# Patient Record
Sex: Male | Born: 1950 | Race: Black or African American | Hispanic: No | Marital: Single | Smoking: Never smoker
Health system: Southern US, Community
[De-identification: ages and names within clinical notes are randomized; demographics above are authoritative.]

## PROBLEM LIST (undated history)

## (undated) DIAGNOSIS — D494 Neoplasm of unspecified behavior of bladder: Secondary | ICD-10-CM

## (undated) DIAGNOSIS — K219 Gastro-esophageal reflux disease without esophagitis: Secondary | ICD-10-CM

## (undated) DIAGNOSIS — I452 Bifascicular block: Secondary | ICD-10-CM

## (undated) DIAGNOSIS — E785 Hyperlipidemia, unspecified: Secondary | ICD-10-CM

## (undated) DIAGNOSIS — I10 Essential (primary) hypertension: Secondary | ICD-10-CM

## (undated) DIAGNOSIS — Z7901 Long term (current) use of anticoagulants: Secondary | ICD-10-CM

## (undated) DIAGNOSIS — N400 Enlarged prostate without lower urinary tract symptoms: Secondary | ICD-10-CM

## (undated) DIAGNOSIS — I2510 Atherosclerotic heart disease of native coronary artery without angina pectoris: Secondary | ICD-10-CM

## (undated) DIAGNOSIS — Z978 Presence of other specified devices: Secondary | ICD-10-CM

## (undated) DIAGNOSIS — N401 Enlarged prostate with lower urinary tract symptoms: Secondary | ICD-10-CM

## (undated) DIAGNOSIS — R52 Pain, unspecified: Secondary | ICD-10-CM

## (undated) DIAGNOSIS — G4762 Sleep related leg cramps: Secondary | ICD-10-CM

## (undated) DIAGNOSIS — J302 Other seasonal allergic rhinitis: Secondary | ICD-10-CM

## (undated) HISTORY — PX: WISDOM TOOTH EXTRACTION: SHX21

## (undated) HISTORY — PX: CARDIAC CATHETERIZATION: SHX172

---

## 1997-08-13 ENCOUNTER — Encounter: Admission: RE | Admit: 1997-08-13 | Discharge: 1997-08-13 | Payer: Self-pay | Admitting: *Deleted

## 2009-10-31 ENCOUNTER — Emergency Department (HOSPITAL_COMMUNITY): Admission: EM | Admit: 2009-10-31 | Discharge: 2009-10-31 | Payer: Self-pay | Admitting: Emergency Medicine

## 2009-11-03 ENCOUNTER — Other Ambulatory Visit (HOSPITAL_COMMUNITY): Admission: RE | Admit: 2009-11-03 | Discharge: 2009-11-25 | Payer: Self-pay | Admitting: Psychiatry

## 2009-11-08 ENCOUNTER — Ambulatory Visit (HOSPITAL_COMMUNITY): Payer: Self-pay | Admitting: Psychiatry

## 2009-11-18 ENCOUNTER — Ambulatory Visit: Payer: Self-pay | Admitting: Psychiatry

## 2010-05-17 ENCOUNTER — Other Ambulatory Visit: Payer: Self-pay | Admitting: Otolaryngology

## 2010-05-17 DIAGNOSIS — R430 Anosmia: Secondary | ICD-10-CM

## 2010-05-20 ENCOUNTER — Ambulatory Visit
Admission: RE | Admit: 2010-05-20 | Discharge: 2010-05-20 | Disposition: A | Discharge status: Home / Self Care | Payer: BC Managed Care – PPO | Source: Ambulatory Visit | Attending: Otolaryngology | Admitting: Otolaryngology

## 2010-05-20 DIAGNOSIS — R430 Anosmia: Secondary | ICD-10-CM

## 2010-05-20 MED ORDER — GADOBENATE DIMEGLUMINE 529 MG/ML IV SOLN
15.0000 mL | Freq: Once | INTRAVENOUS | Status: AC | PRN
  Administered 2010-05-20: 15 mL via INTRAVENOUS

## 2010-12-18 ENCOUNTER — Other Ambulatory Visit: Payer: Self-pay | Admitting: Family Medicine

## 2012-08-07 ENCOUNTER — Other Ambulatory Visit: Payer: Self-pay | Admitting: Urology

## 2012-10-01 ENCOUNTER — Encounter (HOSPITAL_COMMUNITY): Payer: Self-pay | Admitting: Pharmacy Technician

## 2012-10-06 ENCOUNTER — Encounter (HOSPITAL_COMMUNITY)
Admission: RE | Admit: 2012-10-06 | Discharge: 2012-10-06 | Disposition: A | Discharge status: Home / Self Care | Payer: BC Managed Care – PPO | Source: Ambulatory Visit | Attending: Urology | Admitting: Urology

## 2012-10-06 ENCOUNTER — Ambulatory Visit (HOSPITAL_COMMUNITY)
Admission: RE | Admit: 2012-10-06 | Discharge: 2012-10-06 | Disposition: A | Discharge status: Home / Self Care | Payer: BC Managed Care – PPO | Source: Ambulatory Visit | Attending: Urology | Admitting: Urology

## 2012-10-06 ENCOUNTER — Encounter (HOSPITAL_COMMUNITY): Payer: Self-pay

## 2012-10-06 DIAGNOSIS — Z01812 Encounter for preprocedural laboratory examination: Secondary | ICD-10-CM | POA: Insufficient documentation

## 2012-10-06 DIAGNOSIS — Z01818 Encounter for other preprocedural examination: Secondary | ICD-10-CM | POA: Insufficient documentation

## 2012-10-06 DIAGNOSIS — I10 Essential (primary) hypertension: Secondary | ICD-10-CM | POA: Insufficient documentation

## 2012-10-06 HISTORY — DX: Sleep related leg cramps: G47.62

## 2012-10-06 HISTORY — DX: Other seasonal allergic rhinitis: J30.2

## 2012-10-06 HISTORY — DX: Benign prostatic hyperplasia without lower urinary tract symptoms: N40.0

## 2012-10-06 HISTORY — DX: Essential (primary) hypertension: I10

## 2012-10-06 HISTORY — DX: Pain, unspecified: R52

## 2012-10-06 HISTORY — DX: Gastro-esophageal reflux disease without esophagitis: K21.9

## 2012-10-06 HISTORY — DX: Neoplasm of unspecified behavior of bladder: D49.4

## 2012-10-06 LAB — CBC
HCT: 44.2 % (ref 39.0–52.0)
Hemoglobin: 14.5 g/dL (ref 13.0–17.0)
MCH: 28 pg (ref 26.0–34.0)
MCHC: 32.8 g/dL (ref 30.0–36.0)
RBC: 5.17 MIL/uL (ref 4.22–5.81)

## 2012-10-06 LAB — BASIC METABOLIC PANEL
BUN: 12 mg/dL (ref 6–23)
CO2: 26 mEq/L (ref 19–32)
GFR calc non Af Amer: 87 mL/min — ABNORMAL LOW (ref >90)
Glucose, Bld: 119 mg/dL — ABNORMAL HIGH (ref 70–99)
Potassium: 3.9 mEq/L (ref 3.5–5.1)
Sodium: 134 mEq/L — ABNORMAL LOW (ref 135–145)

## 2012-10-06 NOTE — Patient Instructions (Signed)
YOUR SURGERY IS SCHEDULED AT [facility]  ON:  Friday 8/1  REPORT TO [place] [facility]ORT STAY [facility] AT:  7:00 AM      PHONE # FOR SHORT STAY IS [phone]  FOLLOW YOUR CLEAR LIQUID DIET AND LAXATIVE INSTRUCTIONS DAY BEFORE SURGERY - GIVEN BY DR. TANNENBAUM'S OFFICE.  DO NOT EAT OR DRINK ANYTHING AFTER MIDNIGHT THE NIGHT BEFORE YOUR SURGERY.  YOU MAY BRUSH YOUR TEETH, RINSE OUT YOUR MOUTH--BUT NO WATER, NO FOOD, NO CHEWING GUM, NO MINTS, NO CANDIES, NO CHEWING TOBACCO.  PLEASE TAKE THE FOLLOWING MEDICATIONS THE AM OF YOUR SURGERY WITH A FEW SIPS OF WATER:  AMLODIPINE    DO NOT BRING VALUABLES, MONEY, CREDIT CARDS.  DO NOT WEAR JEWELRY, MAKE-UP, NAIL POLISH AND NO METAL PINS OR CLIPS IN YOUR HAIR. CONTACT LENS, DENTURES / PARTIALS, GLASSES SHOULD NOT BE WORN TO SURGERY AND IN MOST CASES-HEARING AIDS WILL NEED TO BE REMOVED.  BRING YOUR GLASSES CASE, ANY EQUIPMENT NEEDED FOR YOUR CONTACT LENS. FOR PATIENTS ADMITTED TO THE HOSPITAL--CHECK OUT TIME THE DAY OF DISCHARGE IS 11:00 AM.  ALL INPATIENT ROOMS ARE PRIVATE - WITH BATHROOM, TELEPHONE, TELEVISION AND WIFI INTERNET.                             PLEASE READ OVER ANY  FACT SHEETS THAT YOU WERE GIVEN:  BLOOD TRANSFUSION INFORMATION, INCENTIVE SPIROMETER INFORMATION. FAILURE TO FOLLOW THESE INSTRUCTIONS MAY RESULT IN THE CANCELLATION OF YOUR SURGERY.   PATIENT SIGNATURE_________________________________

## 2012-10-06 NOTE — Pre-Procedure Instructions (Signed)
EKG REPORT AND OFFICE NOTES FROM 08/05/12 ON PT'S CHART FROM DR. RAMACHANDRAN. CXR WAS DONE TODAY AT [facility] - PREOP.

## 2012-10-17 ENCOUNTER — Inpatient Hospital Stay (HOSPITAL_COMMUNITY): Payer: BC Managed Care – PPO

## 2012-10-17 ENCOUNTER — Encounter (HOSPITAL_COMMUNITY): Payer: Self-pay | Admitting: Certified Registered Nurse Anesthetist

## 2012-10-17 ENCOUNTER — Encounter (HOSPITAL_COMMUNITY): Payer: Self-pay | Admitting: *Deleted

## 2012-10-17 ENCOUNTER — Ambulatory Visit (HOSPITAL_COMMUNITY): Payer: BC Managed Care – PPO | Admitting: Certified Registered Nurse Anesthetist

## 2012-10-17 ENCOUNTER — Inpatient Hospital Stay (HOSPITAL_COMMUNITY)
Admission: RE | Admit: 2012-10-17 | Discharge: 2012-10-20 | DRG: 335 | Disposition: A | Discharge status: Home / Self Care | Payer: BC Managed Care – PPO | Source: Ambulatory Visit | Attending: Urology | Admitting: Urology

## 2012-10-17 ENCOUNTER — Encounter (HOSPITAL_COMMUNITY)
Admission: RE | Disposition: A | Discharge status: Home / Self Care | Payer: Self-pay | Source: Ambulatory Visit | Attending: Urology

## 2012-10-17 DIAGNOSIS — N400 Enlarged prostate without lower urinary tract symptoms: Secondary | ICD-10-CM

## 2012-10-17 DIAGNOSIS — N401 Enlarged prostate with lower urinary tract symptoms: Principal | ICD-10-CM | POA: Diagnosis present

## 2012-10-17 DIAGNOSIS — F329 Major depressive disorder, single episode, unspecified: Secondary | ICD-10-CM | POA: Diagnosis present

## 2012-10-17 DIAGNOSIS — F3289 Other specified depressive episodes: Secondary | ICD-10-CM | POA: Diagnosis present

## 2012-10-17 DIAGNOSIS — I10 Essential (primary) hypertension: Secondary | ICD-10-CM | POA: Diagnosis present

## 2012-10-17 DIAGNOSIS — R972 Elevated prostate specific antigen [PSA]: Secondary | ICD-10-CM | POA: Diagnosis present

## 2012-10-17 DIAGNOSIS — Z79899 Other long term (current) drug therapy: Secondary | ICD-10-CM

## 2012-10-17 DIAGNOSIS — F411 Generalized anxiety disorder: Secondary | ICD-10-CM | POA: Diagnosis present

## 2012-10-17 HISTORY — PX: PROSTATECTOMY: SHX69

## 2012-10-17 LAB — HEMOGLOBIN AND HEMATOCRIT, BLOOD
HCT: 31.7 % — ABNORMAL LOW (ref 39.0–52.0)
Hemoglobin: 10.4 g/dL — ABNORMAL LOW (ref 13.0–17.0)

## 2012-10-17 LAB — ABO/RH: ABO/RH(D): AB POS

## 2012-10-17 SURGERY — PROSTATECTOMY, SUPRAPUBIC APPROACH
Anesthesia: General | Wound class: Clean

## 2012-10-17 MED ORDER — MIDAZOLAM HCL 5 MG/5ML IJ SOLN
INTRAMUSCULAR | Status: DC | PRN
  Administered 2012-10-17: 2 mg via INTRAVENOUS

## 2012-10-17 MED ORDER — ROCURONIUM BROMIDE 100 MG/10ML IV SOLN
INTRAVENOUS | Status: DC | PRN
  Administered 2012-10-17: 50 mg via INTRAVENOUS
  Administered 2012-10-17 (×3): 10 mg via INTRAVENOUS

## 2012-10-17 MED ORDER — SODIUM CHLORIDE 0.9 % IR SOLN
3000.0000 mL | Status: DC
  Administered 2012-10-17 – 2012-10-18 (×4): 3000 mL

## 2012-10-17 MED ORDER — ACETAMINOPHEN 325 MG PO TABS: 650.0000 mg | ORAL_TABLET | ORAL | Status: DC | PRN

## 2012-10-17 MED ORDER — OXYCODONE HCL 5 MG/5ML PO SOLN
5.0000 mg | Freq: Once | ORAL | Status: DC | PRN
  Filled 2012-10-17: qty 5

## 2012-10-17 MED ORDER — HYDROCODONE-ACETAMINOPHEN 5-325 MG PO TABS
1.0000 | ORAL_TABLET | ORAL | Status: DC | PRN
  Administered 2012-10-18: 2 via ORAL
  Administered 2012-10-18: 1 via ORAL
  Administered 2012-10-19 – 2012-10-20 (×4): 2 via ORAL
  Filled 2012-10-17: qty 1
  Filled 2012-10-17 (×5): qty 2

## 2012-10-17 MED ORDER — GLYCOPYRROLATE 0.2 MG/ML IJ SOLN
INTRAMUSCULAR | Status: DC | PRN
  Administered 2012-10-17: 0.6 mg via INTRAVENOUS

## 2012-10-17 MED ORDER — DEXTROSE-NACL 5-0.45 % IV SOLN
INTRAVENOUS | Status: DC
  Administered 2012-10-17: 1000 mL via INTRAVENOUS
  Administered 2012-10-17 – 2012-10-20 (×4): via INTRAVENOUS

## 2012-10-17 MED ORDER — TRIMETHOPRIM 100 MG PO TABS
200.0000 mg | ORAL_TABLET | Freq: Every day | ORAL | Status: DC
  Administered 2012-10-18 – 2012-10-20 (×3): 200 mg via ORAL
  Filled 2012-10-17 (×3): qty 2

## 2012-10-17 MED ORDER — LIDOCAINE HCL (CARDIAC) 20 MG/ML IV SOLN
INTRAVENOUS | Status: DC | PRN
  Administered 2012-10-17: 50 mg via INTRAVENOUS

## 2012-10-17 MED ORDER — CIPROFLOXACIN IN D5W 400 MG/200ML IV SOLN
400.0000 mg | INTRAVENOUS | Status: AC
  Administered 2012-10-17: 400 mg via INTRAVENOUS

## 2012-10-17 MED ORDER — KETOROLAC TROMETHAMINE 30 MG/ML IJ SOLN
30.0000 mg | Freq: Four times a day (QID) | INTRAMUSCULAR | Status: AC
  Administered 2012-10-17 – 2012-10-18 (×6): 30 mg via INTRAVENOUS
  Filled 2012-10-17 (×7): qty 1

## 2012-10-17 MED ORDER — LOSARTAN POTASSIUM-HCTZ 50-12.5 MG PO TABS: 1.0000 | ORAL_TABLET | Freq: Every morning | ORAL | Status: DC

## 2012-10-17 MED ORDER — PROMETHAZINE HCL 25 MG/ML IJ SOLN: 6.2500 mg | INTRAMUSCULAR | Status: DC | PRN

## 2012-10-17 MED ORDER — HYDROMORPHONE HCL PF 1 MG/ML IJ SOLN
0.2500 mg | INTRAMUSCULAR | Status: DC | PRN
  Administered 2012-10-17 (×2): 0.5 mg via INTRAVENOUS

## 2012-10-17 MED ORDER — HYDROCHLOROTHIAZIDE 12.5 MG PO CAPS
12.5000 mg | ORAL_CAPSULE | Freq: Every day | ORAL | Status: DC
  Administered 2012-10-18 – 2012-10-20 (×3): 12.5 mg via ORAL
  Filled 2012-10-17 (×3): qty 1

## 2012-10-17 MED ORDER — PROMETHAZINE HCL 25 MG PO TABS
25.0000 mg | ORAL_TABLET | Freq: Three times a day (TID) | ORAL | Status: DC | PRN
  Administered 2012-10-17: 25 mg via ORAL
  Filled 2012-10-17: qty 1

## 2012-10-17 MED ORDER — AMLODIPINE BESYLATE 5 MG PO TABS
5.0000 mg | ORAL_TABLET | Freq: Every morning | ORAL | Status: DC
  Administered 2012-10-18 – 2012-10-20 (×3): 5 mg via ORAL
  Filled 2012-10-17 (×3): qty 1

## 2012-10-17 MED ORDER — PROPOFOL 10 MG/ML IV BOLUS
INTRAVENOUS | Status: DC | PRN
  Administered 2012-10-17: 200 mg via INTRAVENOUS

## 2012-10-17 MED ORDER — ONDANSETRON HCL 4 MG/2ML IJ SOLN
INTRAMUSCULAR | Status: DC | PRN
  Administered 2012-10-17: 4 mg via INTRAVENOUS

## 2012-10-17 MED ORDER — MEPERIDINE HCL 50 MG/ML IJ SOLN
6.2500 mg | INTRAMUSCULAR | Status: DC | PRN
Start: 2012-10-17 — End: 2012-10-17

## 2012-10-17 MED ORDER — OXYCODONE HCL 5 MG PO TABS: 5.0000 mg | ORAL_TABLET | Freq: Once | ORAL | Status: DC | PRN

## 2012-10-17 MED ORDER — INDIGOTINDISULFONATE SODIUM 8 MG/ML IJ SOLN
INTRAMUSCULAR | Status: DC | PRN
  Administered 2012-10-17: 5 mL via INTRAVENOUS

## 2012-10-17 MED ORDER — MORPHINE SULFATE 2 MG/ML IJ SOLN
2.0000 mg | INTRAMUSCULAR | Status: DC | PRN
  Administered 2012-10-17: 2 mg via INTRAVENOUS
  Filled 2012-10-17: qty 1

## 2012-10-17 MED ORDER — NEOSTIGMINE METHYLSULFATE 1 MG/ML IJ SOLN
INTRAMUSCULAR | Status: DC | PRN
  Administered 2012-10-17: 5 mg via INTRAVENOUS

## 2012-10-17 MED ORDER — PHENYLEPHRINE HCL 10 MG/ML IJ SOLN
INTRAMUSCULAR | Status: DC | PRN
  Administered 2012-10-17: 80 ug via INTRAVENOUS
  Administered 2012-10-17: 40 ug via INTRAVENOUS

## 2012-10-17 MED ORDER — HYDROMORPHONE HCL PF 1 MG/ML IJ SOLN
INTRAMUSCULAR | Status: DC | PRN
  Administered 2012-10-17: 1 mg via INTRAVENOUS
  Administered 2012-10-17: 0.5 mg via INTRAVENOUS

## 2012-10-17 MED ORDER — LACTATED RINGERS IV SOLN
INTRAVENOUS | Status: DC
  Administered 2012-10-17: 11:00:00 via INTRAVENOUS
  Administered 2012-10-17 (×2): 1000 mL via INTRAVENOUS
  Administered 2012-10-17: 10:00:00 via INTRAVENOUS

## 2012-10-17 MED ORDER — SODIUM CHLORIDE 0.9 % IJ SOLN
INTRAMUSCULAR | Status: DC | PRN
  Administered 2012-10-17: 12:00:00

## 2012-10-17 MED ORDER — LOSARTAN POTASSIUM 50 MG PO TABS
50.0000 mg | ORAL_TABLET | Freq: Every day | ORAL | Status: DC
  Administered 2012-10-18 – 2012-10-20 (×3): 50 mg via ORAL
  Filled 2012-10-17 (×3): qty 1

## 2012-10-17 MED ORDER — FENTANYL CITRATE 0.05 MG/ML IJ SOLN
INTRAMUSCULAR | Status: DC | PRN
  Administered 2012-10-17 (×2): 100 ug via INTRAVENOUS

## 2012-10-17 MED ORDER — PHENYLEPHRINE HCL 10 MG/ML IJ SOLN
10.0000 mg | INTRAVENOUS | Status: DC | PRN
  Administered 2012-10-17: 50 ug/min via INTRAVENOUS

## 2012-10-17 MED ORDER — BUPIVACAINE LIPOSOME 1.3 % IJ SUSP
20.0000 mL | Freq: Once | INTRAMUSCULAR | Status: DC
  Filled 2012-10-17: qty 20

## 2012-10-17 SURGICAL SUPPLY — 53 items
BAG URINE DRAINAGE (UROLOGICAL SUPPLIES) ×2 IMPLANT
BLADE EXTENDED COATED 6.5IN (ELECTRODE) ×2 IMPLANT
BLADE HEX COATED 2.75 (ELECTRODE) ×2 IMPLANT
BLADE SURG SZ12 CARB STEEL (BLADE) ×2 IMPLANT
CATH FOLEY 2WAY SLVR 30CC 22FR (CATHETERS) IMPLANT
CATH FOLEY 2WAY SLVR 30CC 24FR (CATHETERS) ×1 IMPLANT
CATH FOLEY 3WAY 30CC 24FR (CATHETERS)
CATH HEMA 3WAY 30CC 24FR COUDE (CATHETERS) ×1 IMPLANT
CATH URET 5FR 28IN OPEN ENDED (CATHETERS) ×1 IMPLANT
CATH URTH STD 24FR FL 3W 2 (CATHETERS) ×1 IMPLANT
CLOTH BEACON ORANGE TIMEOUT ST (SAFETY) ×2 IMPLANT
CONT SPEC 4OZ CLIKSEAL STRL BL (MISCELLANEOUS) ×1 IMPLANT
COVER SURGICAL LIGHT HANDLE (MISCELLANEOUS) ×2 IMPLANT
DISSECTOR ROUND CHERRY 3/8 STR (MISCELLANEOUS) ×1 IMPLANT
DRAIN CHANNEL 10F 3/8 F FF (DRAIN) IMPLANT
DRAIN CHANNEL 19F RND (DRAIN) ×1 IMPLANT
DRAPE LAPAROTOMY T 102X78X121 (DRAPES) ×2 IMPLANT
DRAPE UTILITY 15X26 (DRAPE) ×2 IMPLANT
DRAPE WARM FLUID 44X44 (DRAPE) ×2 IMPLANT
ELECT REM PT RETURN 9FT ADLT (ELECTROSURGICAL) ×2
ELECTRODE REM PT RTRN 9FT ADLT (ELECTROSURGICAL) ×1 IMPLANT
EVACUATOR SILICONE 100CC (DRAIN) ×1 IMPLANT
GAUZE SPONGE 4X4 16PLY XRAY LF (GAUZE/BANDAGES/DRESSINGS) ×2 IMPLANT
GLOVE BIOGEL M STRL SZ7.5 (GLOVE) ×7 IMPLANT
GOWN STRL REIN XL XLG (GOWN DISPOSABLE) ×4 IMPLANT
GUIDEWIRE STR DUAL SENSOR (WIRE) ×1 IMPLANT
KIT BASIN OR (CUSTOM PROCEDURE TRAY) ×2 IMPLANT
LUBRICANT JELLY ST 5GR 8946 (MISCELLANEOUS) ×4 IMPLANT
NEEDLE HYPO 22GX1.5 SAFETY (NEEDLE) ×2 IMPLANT
NS IRRIG 1000ML POUR BTL (IV SOLUTION) ×4 IMPLANT
PACK GENERAL/GYN (CUSTOM PROCEDURE TRAY) ×2 IMPLANT
PLUG CATH AND CAP STER (CATHETERS) ×3 IMPLANT
SCRUB PCMX 4 OZ (MISCELLANEOUS) ×2 IMPLANT
SET IRRIG Y TYPE TUR BLADDER L (SET/KITS/TRAYS/PACK) IMPLANT
SPONGE LAP 18X18 X RAY DECT (DISPOSABLE) ×2 IMPLANT
SPONGE LAP 4X18 X RAY DECT (DISPOSABLE) ×2 IMPLANT
STAPLER SKIN PROX WIDE 3.9 (STAPLE) IMPLANT
STAPLER VISISTAT 35W (STAPLE) IMPLANT
SUT ETHILON 3 0 PS 1 (SUTURE) ×3 IMPLANT
SUT PDS AB 0 CTX 36 PDP370T (SUTURE) ×3 IMPLANT
SUT PDS AB 1 CTX 36 (SUTURE) ×2 IMPLANT
SUT SILK 0 (SUTURE) ×2
SUT SILK 0 30XBRD TIE 6 (SUTURE) ×1 IMPLANT
SUT SILK 2 0 (SUTURE) ×2
SUT SILK 2-0 30XBRD TIE 12 (SUTURE) IMPLANT
SUT VIC AB 0 UR5 27 (SUTURE) ×4 IMPLANT
SUT VIC AB 2-0 UR5 27 (SUTURE) ×2 IMPLANT
SUT VIC AB 2-0 UR6 27 (SUTURE) ×15 IMPLANT
SYR 30ML LL (SYRINGE) ×2 IMPLANT
SYR CONTROL 10ML LL (SYRINGE) ×1 IMPLANT
TOWEL OR 17X26 10 PK STRL BLUE (TOWEL DISPOSABLE) ×4 IMPLANT
TOWEL OR NON WOVEN STRL DISP B (DISPOSABLE) ×2 IMPLANT
WATER STERILE IRR 1500ML POUR (IV SOLUTION) ×2 IMPLANT

## 2012-10-17 NOTE — Progress Notes (Signed)
Pt has committed twice since surgery so we did not get him up to walk.

## 2012-10-17 NOTE — H&P (Signed)
hief Complaint   cc: Dr. Barrie Dunker   Reason For Visit  Yearly f/u, PUS, flowrate & PVR   Active Problems Problems  1. 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  He was originally referred by Dr. Nicholos Johns for evaluation of elevated psa and BPH symptoms. He has had a prostate biopsy in Oct. 2011 with a PSA of 11.25.  Pathology showed BPH & chronic inflammation.  Negative family history for CaP.   He is having cramping in feet.  No dizziness. No fainting.      He has minimal symptoms of bladder outlet symptoms.  Previous PUS showing size = 226.38cc-now 215.65cc.   07/30/12:  PSA  8.63/25% 09/28/11  PSA - 13.16 07/19/10  PSA - 14.70 01/12/10  PSA - 12.06   Past Medical History Problems  1. History of  Anxiety (Symptom) 300.00 2. History of  Depression 311 3. History of  Hypertension 401.9 4. History of  Vertigo 780.4  Surgical History Problems  1. History of  No Surgical Problems  Current Meds 1. AmLODIPine Besylate 5 MG Oral Tablet; Therapy: 26Feb2013 to 2. Finasteride 5 MG Oral Tablet; TAKE 1 TABLET DAILY AS DIRECTED; Therapy: 17Jul2013 to  (Evaluate:12Jul2014)  Requested for: 17Jul2013; Last Rx:17Jul2013 3. Losartan Potassium-HCTZ 50-12.5 MG Oral Tablet; Therapy: 26Feb2013 to 4. Tamsulosin HCl 0.4 MG Oral Capsule; TAKE 2 CAPSULES BY MOUTH DAILY; Therapy:  04Oct2011 to (Evaluate:12Jul2014)  Requested for: 17Jul2013; Last Rx:17Jul2013 5. Triamcinolone Acetonide 0.025 % External Cream; Therapy: 08May2014 to  Allergies Medication  1. Penicillins  Family History Problems  1. Maternal history of  Breast Lump 2. Sororal history of  Breast Lump 3. Sororal history of  Breast Lump 4. Family history of  Death In The Family Father 67; stomach cancer 5. Family history  of  Family Health Status - Mother's Age 89 6. Maternal history of  Heart Disease V17.49 7. Family history of  Laryngeal Cancer V16.2  Social History Problems  1. Marital History - Single 2. Never A Smoker 3. 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 History of  Alcohol Use 5. History of  Caffeine Use 6. History of  Tobacco Use  Review of Systems Genitourinary, constitutional, skin, eye, otolaryngeal, hematologic/lymphatic, cardiovascular, pulmonary, endocrine, musculoskeletal, gastrointestinal, neurological and psychiatric system(s) were reviewed and pertinent findings if present are noted.  Genitourinary: urinary frequency, feelings of urinary urgency, nocturia, weak urinary stream, urinary stream starts and stops, incomplete emptying of bladder and initiating urination requires straining.    Vitals Vital Signs [Data Includes: Last 1 Day]  20May2014 11:24AM  BMI Calculated: 26.66 BSA Calculated: 1.97 Height: 5 ft 9 in Weight: 180 lb  Blood Pressure: 146 / 74 Temperature: 97.8 F Heart Rate: 78  Physical Exam Rectal: Rectal exam demonstrates normal sphincter tone, no tenderness and no masses. Estimated prostate size is 4+. The prostate has no nodularity and is not tender. The left seminal vesicle is nonpalpable. The right seminal vesicle is nonpalpable. The perineum is normal on inspection.    Results/Data  Flow Rate: Voided 25 ml. A peak flow rate of 77ml/s and mean flow rate of 41ml/s.  PVR: Ultrasound PVR > 518.89 ml. Selected Results  PSA REFLEX TO FREE 12May2014 07:47AM Tony Shaffer  SPECIMEN TYPE: BLOOD  Test Name Result Flag Reference  PSA 8.63 ng/mL H <=4.00  TEST METHODOLOGY: ECLIA PSA (ELECTROCHEMILUMINESCENCE IMMUNOASSAY)  PSA, FREE 2.14 ng/mL    PSA, %FREE 25 %  > 25  PROBABILITY OF PROSTATE CANCER   (FOR MEN WITH NON-SUSPICIOUS DRE RESULTS AND PSA BETWEEN 4 AND   10 NG/ML, BY PATIENT AGE)     % FREE PSA                          PATIENT AGE                           50 TO 59 YEARS  60 TO 69 YEARS  >70 YEARS    <=10%                  49.2%           57.5%          64.5%    11 - 18%               26.9%           33.9%          40.8%    19 - 25%               18.3%           23.9%          29.7%    >25%                    9.1%           12.2%          15.8%   Procedure  PUS - 215.65 grams with median lobe.  Volume w/o median lobe - 135.63 grams (L - 7.35cm, H - 4.65cm, W - 6.88cm).  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     Assessment Assessed  1. PSA,Elevated 790.93 2. 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 He has an IPSS= 21, with a peak flow of 2cc/sed ( normsl 30cc/sec), and a pvr= 518.89cc. He has vague symptoms of weakness sionce starting flomax/finestereide and will change to Uroxatral to see if this make any difference. He will need open prostatectomy in July, and will be out of work for 6 weeks.   Plan Benign Localized Prostatic Hyperplasia With Urinary Obstruction (600.21)  1. Alfuzosin HCl ER 10 MG Oral Tablet Extended Release 24 Hour; 1HS - TAKE ONE TABLET BY  MOUTH AT BEDTIME; Therapy: 20May2014 to (Evaluate:16Dec2014); Last Rx:20May2014 2. Tamsulosin HCl 0.4 MG Oral Capsule; TAKE 2 CAPSULES BY MOUTH DAILY; Therapy:  04Oct2011 to 20May2014; Last Rx:17Jul2013; Status: DISCONTINUED   Open prostatectomy in July.   Signatures

## 2012-10-17 NOTE — Anesthesia Postprocedure Evaluation (Signed)
Anesthesia Post Note  Patient: Tony Shaffer  Procedure(s) Performed: Procedure(s) (LRB): TRANSVESICLE PROSTATECTOMY SUPRAPUBIC (N/A)  Anesthesia type: General  Patient location: PACU  Post pain: Pain level controlled  Post assessment: Post-op Vital signs reviewed  Last Vitals: BP 123/55  Pulse 64  Temp(Src) 36.4 C (Oral)  Resp 8  SpO2 100%  Post vital signs: Reviewed  Level of consciousness: sedated  Complications: No apparent anesthesia complications

## 2012-10-17 NOTE — Progress Notes (Signed)
Pt unable to drink contrast for CT that is ordered. Called CT and let them know and they said to call back when he wasn't as nauseous.

## 2012-10-17 NOTE — Interval H&P Note (Signed)
History and Physical Interval Note:  10/17/2012 8:59 AM  Tony Shaffer  has presented today for surgery, with the diagnosis of BPH  The various methods of treatment have been discussed with the patient and family. After consideration of risks, benefits and other options for treatment, the patient has consented to  Procedure(s): TRANSVESICLE PROSTATECTOMY SUPRAPUBIC (N/A) as a surgical intervention .  The patient's history has been reviewed, patient examined, no change in status, stable for surgery.  I have reviewed the patient's chart and labs.  Questions were answered to the patient's satisfaction.     [name] I

## 2012-10-17 NOTE — Op Note (Signed)
Pre-operative diagnosis :   BPH failing one-year of maximal medical therapy  Postoperative diagnosis:  Same  Operation:  Open simple retropubic prostatectomy  Surgeon:  S. Patsi Sears, MD  First assistant:  Lujean Rave PA-C.  Anesthesia:  General endotracheal  Preparation: After appropriate preanesthesia, the patient was brought to the operative room, placed on the operating table in the dorsal supine position where general endotracheal anesthesia was introduced. A soft roll was placed under the lumbar portion of his back, and the table was flexed. Foley catheter was placed and greater than 1000 cc was drained from his bladder of clear, straw-colored urine. The arm band was double checked. The history was double checked.  Review history:  62 yo AA male with continued Benign Localized Prostatic Hyperplasia With Urinary Obstruction (600.21)  Markedly enlarged prostate of 215cc, even after 1 yr of finestereide. He has an IPSS= 21, with a peak flow of 2cc/sed ( normsl 30cc/sec) ( despite alpha blocker), and a pvr= 518.89cc. He has vague symptoms of weakness since starting flomax/finestereide and no change with Uroxatral. He will need open prostatectomy in July, and will be out of work for 6 weeks.    Statement of  Likelihood of Success: Excellent. TIME-OUT observed.:  Procedure:  Area of incision was marked. The pubic tubercle was marked. A 6 cm midline incision is made extending from the pubic tubercle to the umbilicus. Subcutaneous tissue is dissected with electrosurgical unit. The midline is entered with minimal bleeding. The peritoneum is carefully avoided. Foley catheter is placed, as a 24 three-way hematuria catheter. The bladder was noted to have greater than 1000 cc. Abdominal examination revealed. Umbilical mass, which appeared to disappear after bladder filling. The mass, however, did not appear part of the bladder. It will be recommended that he have for separate evaluation  postoperatively.  Following placement of the self-retaining retractor, the prostate was noted to be quite large. 2 separate 2-0 Vicryl stay sutures were placed lateralward the prostate, and horizontal incision is made in the prostate capsule. Sharp and blunt dissection was then used to dissect the prostate from its bed. Large median lobe was also identified, and this was dissected as well. Indigocarmine was given, and blue contrast was identified immediately.  Care was taken to save as much bladder neck as possible, and as much ureter as possible. The 6:00 portion of the bladder neck was reapproximated to the 6:00 portion of the urethra, and 3 suture posterior urethroplasty was accomplished. The remaining urethra was closed with interrupted 2-0 Vicryl sutures. Following this, the prostate capsule was closed with running and interrupted 2-0 Vicryl sutures. Irrigation of the bladder were showed no evidence of leakage. A single bleeding point was noted in the retropubic area, and this was suture ligated with 2-0 Vicryl suture. The wound was copiously irrigated. The patient was felt to lose approximately 1000 cc of blood during the procedure, but was hemodynamically stable during the procedure. He received Expariel injection in the fascia, and following this, the patient was awakened, taken to recovery room in good condition. Sponge and needle count was correct.

## 2012-10-17 NOTE — Transfer of Care (Signed)
Immediate Anesthesia Transfer of Care Note  Patient: Tony Shaffer  Procedure(s) Performed: Procedure(s): TRANSVESICLE PROSTATECTOMY SUPRAPUBIC (N/A)  Patient Location: PACU  Anesthesia Type:General  Level of Consciousness: awake and alert   Airway & Oxygen Therapy: Patient Spontanous Breathing and Patient connected to face mask oxygen  Post-op Assessment: Report given to PACU RN and Post -op Vital signs reviewed and stable  Post vital signs: Reviewed and stable  Complications: No apparent anesthesia complications

## 2012-10-17 NOTE — Progress Notes (Signed)
Walked in to patients room and he was shivering and shaking. Checked pt's temperature and it was 97.3. Got warm blankets and turned up heat in room. Pt stated that he was feeling better. Pt also vomited as he was trying to eat his dinner.

## 2012-10-17 NOTE — Progress Notes (Signed)
Day of Surgery Subjective: Patient reports slight nausea.  No wound pain. ( Expareil).   Objective: Vital signs in last 24 hours: Temp:  [97.3 F (36.3 C)-98.8 F (37.1 C)] 97.3 F (36.3 C) (08/01 1729) Pulse Rate:  [62-83] 76 (08/01 1330) Resp:  [8-18] 12 (08/01 1330) BP: (112-160)/(50-74) 118/51 mmHg (08/01 1330) SpO2:  [100 %] 100 % (08/01 1330) Weight:  [79.379 kg (175 lb)] 79.379 kg (175 lb) (08/01 1342)  Intake/Output from previous day:   Intake/Output this shift: Total I/O In: 4740 [P.O.:240; I.V.:3200; Other:1300] Out: 6410 [Urine:5410; Blood:1000]  Physical Exam:  General:alert and cooperative GI: not done and soft, non tender, normal bowel sounds, no palpable masses, no organomegaly, no inguinal hernia Male genitalia: not done no penile lesions or discharge   Lab Results:  Recent Labs  10/17/12 1220  HGB 10.4*  HCT 31.7*   BMET No results found for this basename: NA, K, CL, CO2, GLUCOSE, BUN, CREATININE, CALCIUM,  in the last 72 hours No results found for this basename: LABPT, INR,  in the last 72 hours No results found for this basename: LABURIN,  in the last 72 hours No results found for this or any previous visit.  Studies/Results: No results found.  Assessment/Plan: Palpable upper mass under anesthesia-probably bladder ( 1000cc), with resolution post prostatectomy. However, will ck CT scan in AM.  Anticipate benign recovery process. . Dr. Vernie Ammons to see in AM.     LOS: 0 days   [name] I 10/17/2012, 6:39 PM

## 2012-10-17 NOTE — Anesthesia Preprocedure Evaluation (Signed)
Anesthesia Evaluation  Patient identified by MRN, date of birth, ID band Patient awake    Reviewed: Allergy & Precautions, H&P , NPO status , Patient's Chart, lab work & pertinent test results  Airway       Dental  (+) Dental Advisory Given   Pulmonary neg pulmonary ROS,          Cardiovascular hypertension, Pt. on medications     Neuro/Psych negative neurological ROS  negative psych ROS   GI/Hepatic Neg liver ROS, GERD-  Medicated,  Endo/Other  negative endocrine ROS  Renal/GU negative Renal ROS     Musculoskeletal negative musculoskeletal ROS (+)   Abdominal   Peds  Hematology negative hematology ROS (+)   Anesthesia Other Findings   Reproductive/Obstetrics                           Anesthesia Physical Anesthesia Plan  ASA: II  Anesthesia Plan: General   Post-op Pain Management:    Induction: Intravenous  Airway Management Planned: Oral ETT  Additional Equipment:   Intra-op Plan:   Post-operative Plan: Extubation in OR  Informed Consent: I have reviewed the patients History and Physical, chart, labs and discussed the procedure including the risks, benefits and alternatives for the proposed anesthesia with the patient or authorized representative who has indicated his/her understanding and acceptance.   Dental advisory given  Plan Discussed with: CRNA  Anesthesia Plan Comments:         Anesthesia Quick Evaluation

## 2012-10-18 ENCOUNTER — Inpatient Hospital Stay (HOSPITAL_COMMUNITY): Payer: BC Managed Care – PPO

## 2012-10-18 LAB — BASIC METABOLIC PANEL
Chloride: 103 mEq/L (ref 96–112)
Creatinine, Ser: 1.04 mg/dL (ref 0.50–1.35)
GFR calc Af Amer: 87 mL/min — ABNORMAL LOW (ref >90)
Sodium: 135 mEq/L (ref 135–145)

## 2012-10-18 LAB — HEMOGLOBIN AND HEMATOCRIT, BLOOD
HCT: 24.3 % — ABNORMAL LOW (ref 39.0–52.0)
Hemoglobin: 8.2 g/dL — ABNORMAL LOW (ref 13.0–17.0)

## 2012-10-18 MED ORDER — IOHEXOL 300 MG/ML  SOLN
100.0000 mL | Freq: Once | INTRAMUSCULAR | Status: AC | PRN
  Administered 2012-10-18: 100 mL via INTRAVENOUS

## 2012-10-18 MED ORDER — IOHEXOL 300 MG/ML  SOLN
50.0000 mL | Freq: Once | INTRAMUSCULAR | Status: AC | PRN
  Administered 2012-10-18: 50 mL via ORAL

## 2012-10-18 NOTE — Progress Notes (Signed)
1 Day Post-Op Subjective: Patient had some nausea yesterday but that has resolved. Not having any significant incisional pain.   Objective: Vital signs in last 24 hours: Temp:  [97.3 F (36.3 C)-98.8 F (37.1 C)] 98.3 F (36.8 C) (08/01 2200) Pulse Rate:  [62-83] 71 (08/01 2200) Resp:  [8-18] 16 (08/01 2200) BP: (112-160)/(50-74) 119/65 mmHg (08/01 2200) SpO2:  [100 %] 100 % (08/01 2200) Weight:  [79.379 kg (175 lb)] 79.379 kg (175 lb) (08/01 1342)  Intake/Output from previous day: 08/01 0701 - 08/02 0700 In: 11659.2 [P.O.:480; I.V.:3679.2] Out: 40981 [Urine:11610; Drains:20; Blood:1000] Intake/Output this shift: Total I/O In: 3600 [Other:3600] Out: 5100 [Urine:5100]  Physical Exam:  Seen out walking the hall. Urine completely clear. Incision with intact, dry dressing.  Lab Results:  Recent Labs  10/17/12 1220  HGB 10.4*  HCT 31.7*   BMET No results found for this basename: NA, K, CL, CO2, GLUCOSE, BUN, CREATININE, CALCIUM,  in the last 72 hours No results found for this basename: LABPT, INR,  in the last 72 hours No results found for this basename: LABURIN,  in the last 72 hours No results found for this or any previous visit.  Studies/Results: No results found.  Assessment/Plan: Doing quite well.  Would like to start eating.   Clear liq. Diet adv. As tol.  Will check labs - currently pending.  Scheduled for CT   LOS: 1 day   Audrianna Driskill C 10/18/2012, 3:47 AM

## 2012-10-19 LAB — HEMOGLOBIN AND HEMATOCRIT, BLOOD
HCT: 25.4 % — ABNORMAL LOW (ref 39.0–52.0)
Hemoglobin: 8.3 g/dL — ABNORMAL LOW (ref 13.0–17.0)

## 2012-10-19 MED ORDER — SIMETHICONE 80 MG PO CHEW
80.0000 mg | CHEWABLE_TABLET | Freq: Four times a day (QID) | ORAL | Status: DC | PRN
  Administered 2012-10-19 – 2012-10-20 (×3): 80 mg via ORAL
  Filled 2012-10-19 (×3): qty 1

## 2012-10-19 NOTE — Progress Notes (Signed)
2 Days Post-Op Subjective: Patient reports no significant pain.He indicates he has been up walking and his urine has remained clear. He is tolerating a regular diet.  Objective: Vital signs in last 24 hours: Temp:  [98.4 F (36.9 C)-99 F (37.2 C)] 98.6 F (37 C) (08/03 1336) Pulse Rate:  [79-81] 79 (08/03 1336) Resp:  [16] 16 (08/03 1336) BP: (103-116)/(51-79) 110/53 mmHg (08/03 1336) SpO2:  [98 %-100 %] 98 % (08/03 1336)  Intake/Output from previous day: 08/02 0701 - 08/03 0700 In: 1570.8 [P.O.:150; I.V.:1420.8] Out: 7200 [Urine:7200] Intake/Output this shift: Total I/O In: 1919.2 [P.O.:840; I.V.:1079.2] Out: 1745 [Urine:1725; Drains:20]  Physical Exam:  He is sitting up in a chair alert and oriented in no distress. His abdomen is free of peritoneal signs.  Lab Results:  Recent Labs  10/17/12 1220 10/18/12 0522 10/19/12 1016  HGB 10.4* 8.2* 8.3*  HCT 31.7* 24.3* 25.4*   BMET  Recent Labs  10/18/12 0522  NA 135  K 3.7  CL 103  CO2 26  GLUCOSE 117*  BUN 8  CREATININE 1.04  CALCIUM 8.0*   No results found for this basename: LABPT, INR,  in the last 72 hours No results found for this basename: LABURIN,  in the last 72 hours No results found for this or any previous visit.  Studies/Results: Ct Abdomen Pelvis W Contrast  10/18/2012   *RADIOLOGY REPORT*  Clinical Data: Abdominal mass.  Post prostatectomy.  CT ABDOMEN AND PELVIS WITH CONTRAST  Technique:  Multidetector CT imaging of the abdomen and pelvis was performed following the standard protocol during bolus administration of intravenous contrast.  Contrast: 50mL OMNIPAQUE IOHEXOL 300 MG/ML  SOLN, OMNIPAQUE IOHEXOL 300 MG/ML  SOLN  Comparison: None.  Findings: Linear scarring or atelectasis in the lung bases.  Heart is normal size.  No effusions.  Liver, gallbladder, stomach, spleen, pancreas, adrenals and kidneys are normal.  Descending colonic and sigmoid diverticulosis.  Postoperative changes in the  pelvis from prostatectomy. Foley catheter present in a partially decompressed bladder.  There is irregular wall thickening throughout the urinary bladder.  This is most pronounced posteriorly where there is a rounded lesion concerning for bladder wall mass.  This measures approximately 3.6 cm in greatest diameter.  Calcifications noted within the posterior bladder wall.  Appendix is visualized and is normal.  Small bowel is decompressed. Aorta is normal caliber.  No free fluid, free air or adenopathy.    No acute bony abnormality.  IMPRESSION: Irregular bladder wall thickening, most pronounced posteriorly. While this could reflect muscular hypertrophy related to enlarged prostate, I cannot exclude infiltrative tumor.  There is also a rounded exophytic bladder wall mass posteriorly concerning for neoplasm.  Descending colonic and sigmoid diverticulosis.  Postsurgical changes from prostatectomy.   Original Report Authenticated By: Charlett Nose, M.D.    Assessment/Plan: He is progressing quite well. We discussed the results of his CT scan which revealed no a of the abdominal wall to account for the "bulge" that he complained of. He did reveal a filling defect within the bladder although I think this is most likely a clot since he just had surgery although it was performed through the prostate and therefore the bladder was not entered and visualized. He may require cystoscopy at some point once his prostate has healed. His hemoglobin had decreased slightly over the previous 24 hours so I rechecked a hemoglobin this morning and it is stable. He is not having any active bleeding. His urine is completely clear on  slow running CBI so I am going to stop that.  Stop CBI  He may be ready for discharge in the morning.   LOS: 2 days   Ahriyah Vannest C 10/19/2012, 4:03 PM

## 2012-10-20 ENCOUNTER — Encounter (HOSPITAL_COMMUNITY): Payer: Self-pay | Admitting: Urology

## 2012-10-20 MED ORDER — TRIMETHOPRIM 100 MG PO TABS: 100.0000 mg | ORAL_TABLET | ORAL | Status: DC

## 2012-10-20 MED ORDER — OXYCODONE-ACETAMINOPHEN 5-325 MG PO TABS: 1.0000 | ORAL_TABLET | Freq: Four times a day (QID) | ORAL | Status: DC | PRN

## 2012-10-20 NOTE — Discharge Summary (Signed)
Physician Discharge Summary  Patient ID: Tony Shaffer MRN: [id number] DOB/AGE: 1950-04-02 62 y.o.  Admit date: 10/17/2012 Discharge date: 10/20/2012  Admission Diagnoses: BPH  Discharge Diagnoses:   BPH  Discharged Condition: good  Hospital Course:  Benign post op course. Decrease in Hgb post op anticipated, but not clinically significant.   Consults: none  Significant Diagnostic Studies: none Treatments: open retropubic prostatectomy  Discharge Exam: Blood pressure 120/58, pulse 78, temperature 98.7 F (37.1 C), temperature source Oral, resp. rate 18, height 5\' 9"  (1.753 m), weight 79.379 kg (175 lb), SpO2 100.00%. Normal abdominal exam. + flatus.   Disposition: Final discharge disposition not confirmed  Discharge Orders   Future Orders Complete By Expires     Care order/instruction  As directed     Scheduling Instructions:      1. Teach how to drain catheter bag ( overnight and leg bag) 2. D/c JP drain    Continue foley catheter  As directed     Diet - low sodium heart healthy  As directed     Discharge instructions  As directed     Comments:      I have reviewed discharge instructions in detail with the patient. They will follow-up with me or their physician as scheduled. My nurse will also be calling the patients as per protocol.    Discharge patient  As directed     Discontinue IV  As directed     Increase activity slowly  As directed         Medication List    STOP taking these medications       finasteride 5 MG tablet  Commonly known as:  PROSCAR      TAKE these medications       alfuzosin 10 MG 24 hr tablet  Commonly known as:  UROXATRAL  Take 10 mg by mouth at bedtime.     amLODipine 5 MG tablet  Commonly known as:  NORVASC  Take 5 mg by mouth every morning.     ibuprofen 200 MG tablet  Commonly known as:  ADVIL,MOTRIN  Take 400 mg by mouth every 8 (eight) hours as needed for pain.     losartan-hydrochlorothiazide 50-12.5 MG per tablet   Commonly known as:  HYZAAR  Take 1 tablet by mouth every morning.     multivitamin with minerals Tabs tablet  Take 1 tablet by mouth daily.     naproxen sodium 220 MG tablet  Commonly known as:  ANAPROX  Take 220 mg by mouth. ALEVE ONLY IF IN PAIN     oxyCODONE-acetaminophen 5-325 MG per tablet  Commonly known as:  ROXICET  Take 1 tablet by mouth every 6 (six) hours as needed for pain.     PRESCRIPTION MEDICATION  TRIAMCINOLONE / HYDROCORTI  0.1% / 1%  APPLY TO AFFECTED AREA 2 TIMES A DAY  ( RASH ABOVE LEFT ANKLE AND SMALL AREAS ON RT HIP)     promethazine 25 MG tablet  Commonly known as:  PHENERGAN  Take 25 mg by mouth every 8 (eight) hours as needed for nausea.     trimethoprim 100 MG tablet  Commonly known as:  TRIMPEX  Take 1 tablet (100 mg total) by mouth 1 day or 1 dose.           Follow-up Information   Follow up with Starkeisha Vanwinkle I, MD. (call office for appt date and time)    Contact information:   [address]  [place] [address] [phone]       Signed: [name] I 10/20/2012, 10:54 AM

## 2012-10-20 NOTE — Progress Notes (Signed)
3 Days Post-Op Subjective: Patient reports no pain. Clear urine. + flatus, but no BM. Minimal drainage from the drain.   Objective: Vital signs in last 24 hours: Temp:  [98.5 F (36.9 C)-98.7 F (37.1 C)] 98.7 F (37.1 C) (08/04 0529) Pulse Rate:  [76-83] 78 (08/04 0916) Resp:  [16-19] 18 (08/04 0529) BP: (110-142)/(53-87) 120/58 mmHg (08/04 0916) SpO2:  [94 %-100 %] 100 % (08/04 0529)  Intake/Output from previous day: 08/03 0701 - 08/04 0700 In: 4892.1 [P.O.:1840; I.V.:3052.1] Out: 5275 [Urine:5250; Drains:25] Intake/Output this shift: Total I/O In: 240 [P.O.:240] Out: 1 [Drains:1]  Physical Exam:  General:alert and cooperative GI: not done and soft, non tender, normal bowel sounds, no palpable masses, no organomegaly, no inguinal hernia Male genitalia: not done Bladder: no bladder distension noted Extremities: extremities normal, atraumatic, no cyanosis or edema  Lab Results:  Recent Labs  10/17/12 1220 10/18/12 0522 10/19/12 1016  HGB 10.4* 8.2* 8.3*  HCT 31.7* 24.3* 25.4*   BMET  Recent Labs  10/18/12 0522  NA 135  K 3.7  CL 103  CO2 26  GLUCOSE 117*  BUN 8  CREATININE 1.04  CALCIUM 8.0*   No results found for this basename: LABPT, INR,  in the last 72 hours No results found for this basename: LABURIN,  in the last 72 hours No results found for this or any previous visit.  Studies/Results: No results found.  Assessment/Plan: Stable today. He will have JP removed today, and is taught catheter bag drain technique, an dwill be discharged with both leg bag and overnight bag. He will RTC per appointment for pullout cystogram. Anticipte 6 week-12 week recovery.   LOS: 3 days   Tony Shaffer I 10/20/2012, 10:44 AM

## 2012-10-21 LAB — TYPE AND SCREEN
Antibody Screen: NEGATIVE
Unit division: 0

## 2014-04-06 IMAGING — CT CT ABD-PELV W/ CM
2 of 5 series · 17 of 46 positions shown, 19 images · IV contrast (OMNIPAQUE)
Comparison: None.

CLINICAL DATA: Abdominal mass.  Post prostatectomy.

CT ABDOMEN AND PELVIS WITH CONTRAST
TECHNIQUE: Multidetector CT imaging of the abdomen and pelvis was
performed following the standard protocol during bolus
administration of intravenous contrast.
Contrast: 50mL OMNIPAQUE IOHEXOL 300 MG/ML  SOLN, 100mL OMNIPAQUE
IOHEXOL 300 MG/ML  SOLN

[Series 2: rtn a/p with · axial · 0.79mm/px · z∈[-446,-70]mm · 14 of 87 slices shown, 16 images]
[im 6/87  soft-tissue]
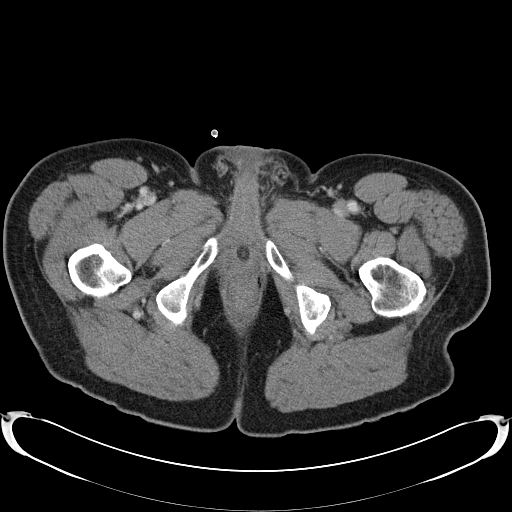
[im 6/87  bone]
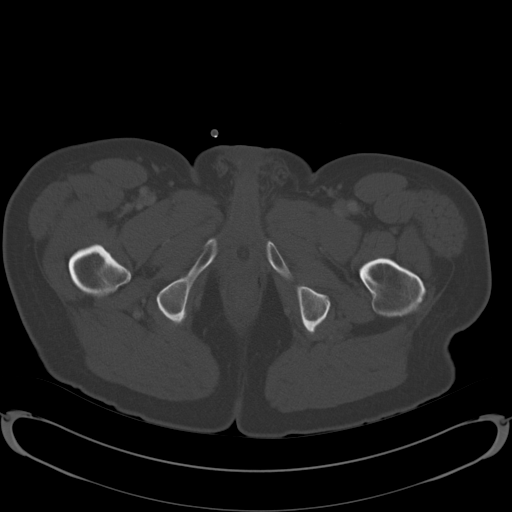
[im 12/87  soft-tissue]
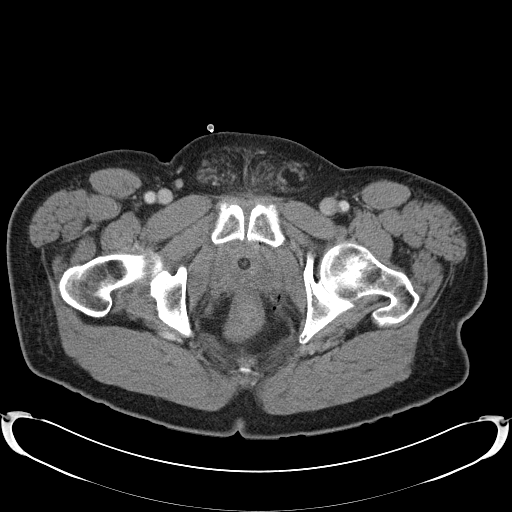
[im 18/87  soft-tissue]
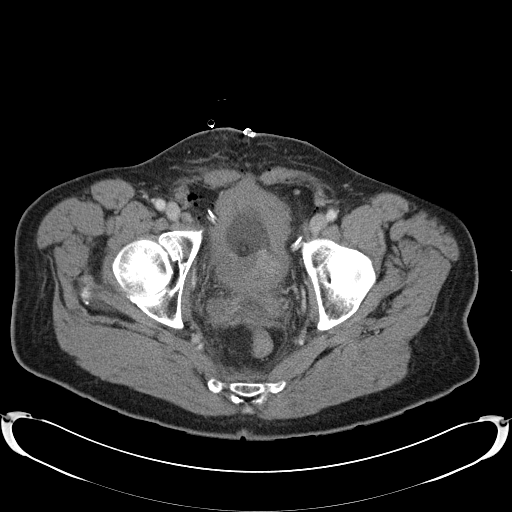
[im 23/87  soft-tissue]
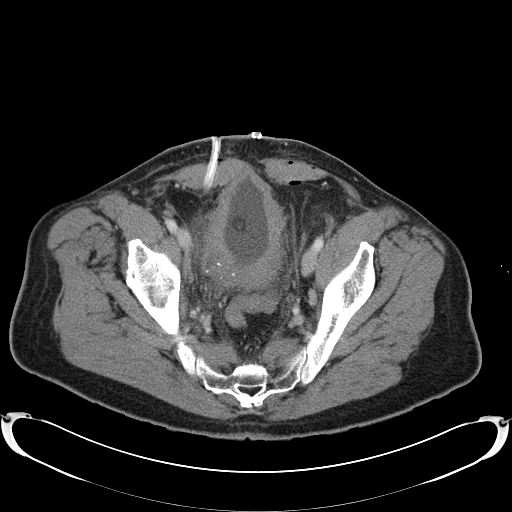
[im 29/87  soft-tissue]
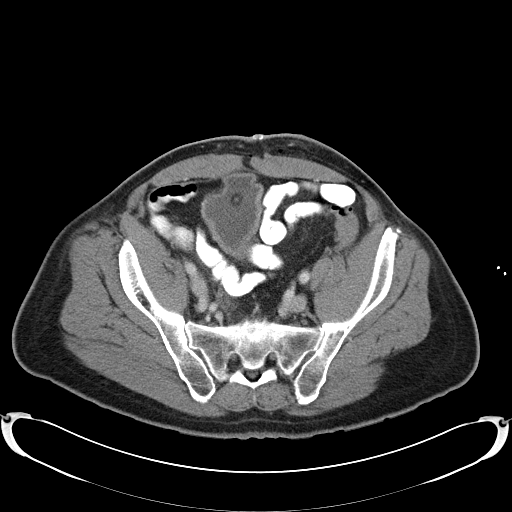
[im 35/87  soft-tissue]
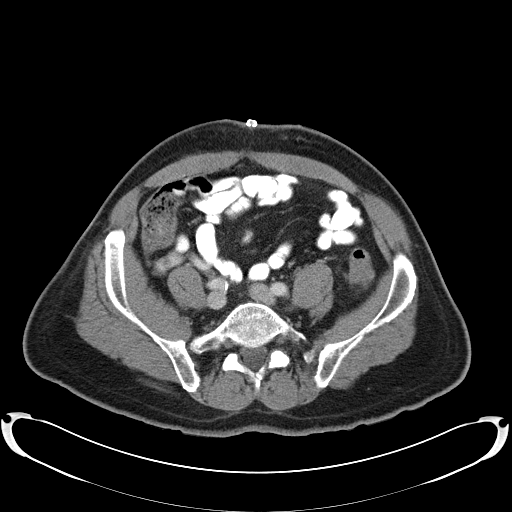
[im 41/87  soft-tissue]
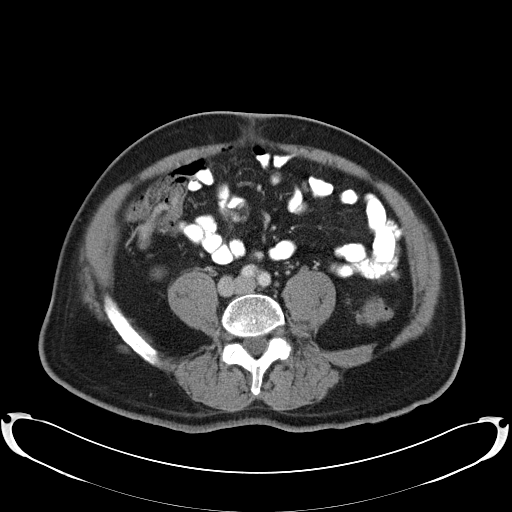
[im 46/87  soft-tissue]
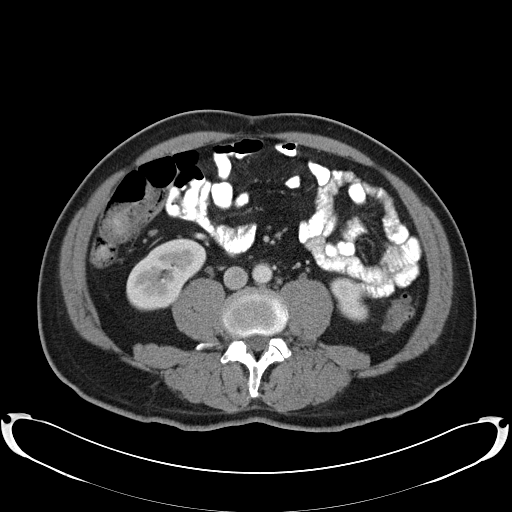
[im 52/87  soft-tissue]
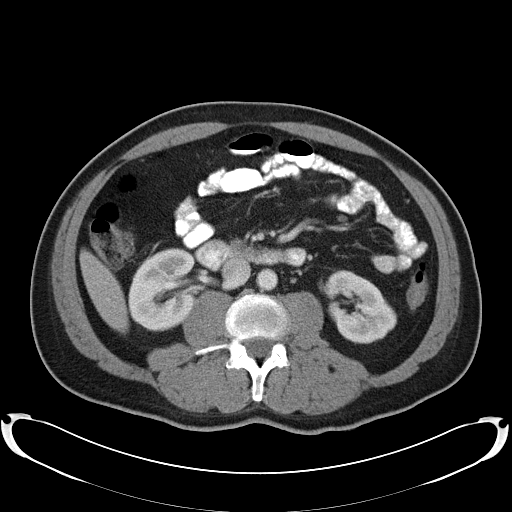
[im 52/87  bone]
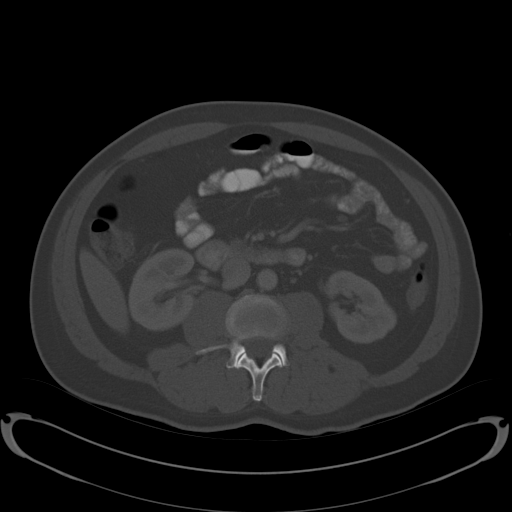
[im 58/87  soft-tissue]
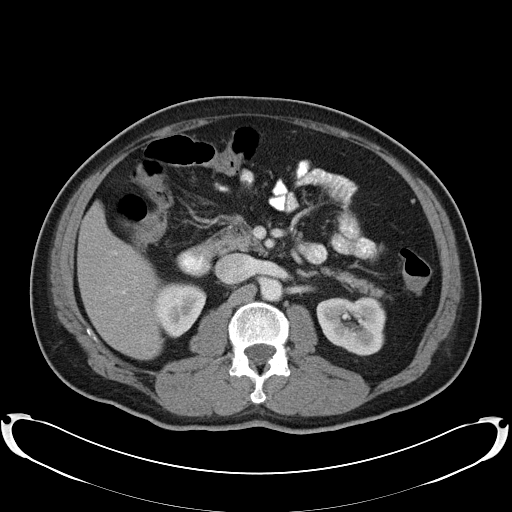
[im 64/87  soft-tissue]
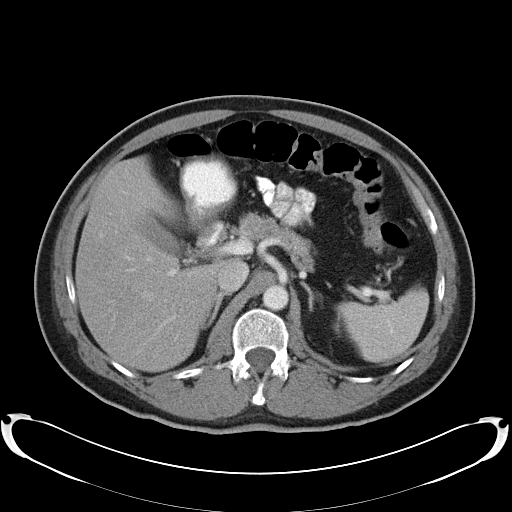
[im 69/87  soft-tissue]
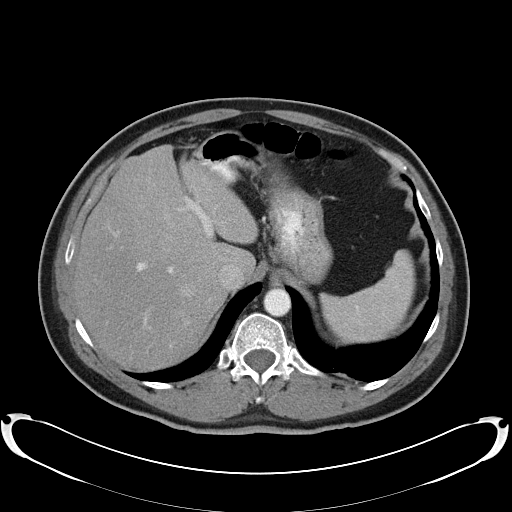
[im 75/87  soft-tissue]
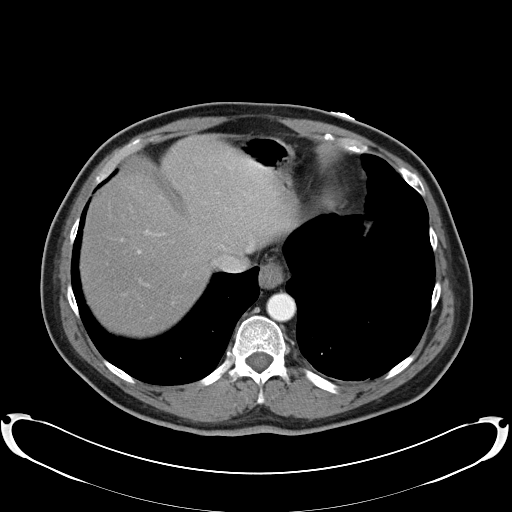
[im 81/87  soft-tissue]
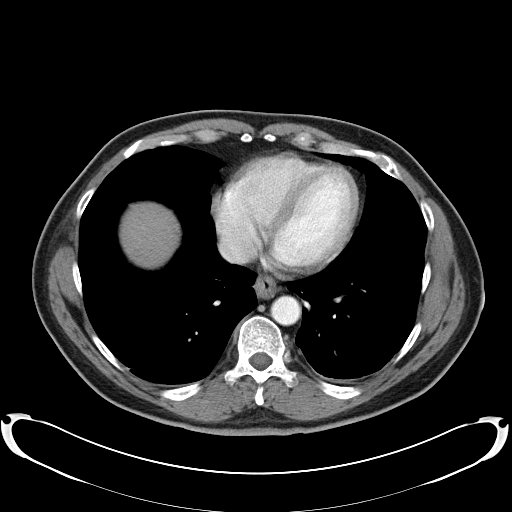

[Series 5: coronal · coronal · 0.86mm/px · 3 of 132 slices shown]
[im 44/132  soft-tissue]
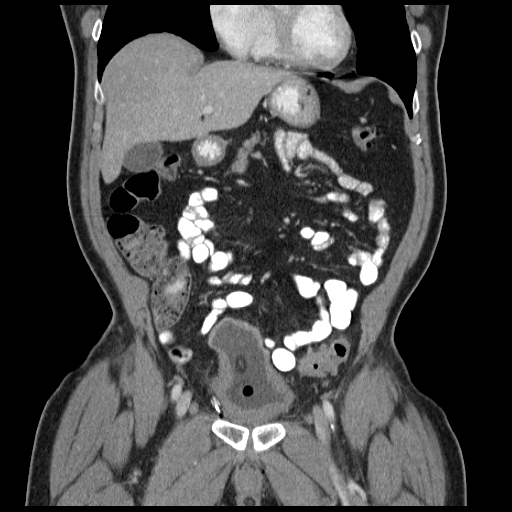
[im 59/132  soft-tissue]
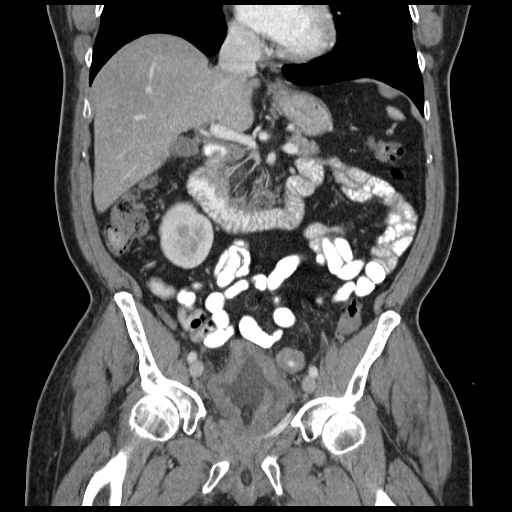
[im 73/132  soft-tissue]
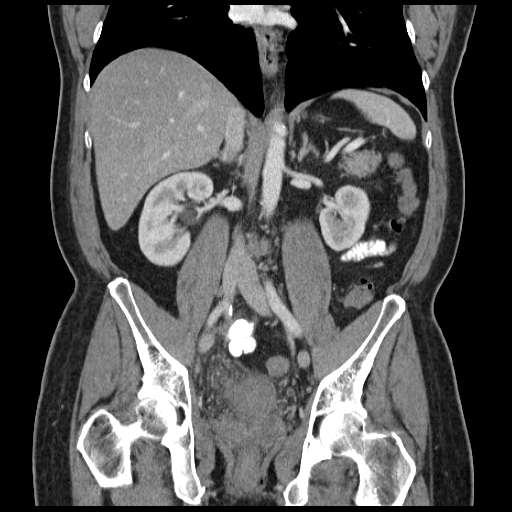

[17 of 46 positions shown; findings below may reference images not displayed]

FINDINGS: Linear scarring or atelectasis in the lung bases.  Heart
is normal size.  No effusions.

Liver, gallbladder, stomach, spleen, pancreas, adrenals and kidneys
are normal.

Descending colonic and sigmoid diverticulosis.  Postoperative
changes in the pelvis from prostatectomy. Foley catheter present in
a partially decompressed bladder.  There is irregular wall
thickening throughout the urinary bladder.  This is most pronounced
posteriorly where there is a rounded lesion concerning for bladder
wall mass.  This measures approximately 3.6 cm in greatest
diameter.  Calcifications noted within the posterior bladder wall.

Appendix is visualized and is normal.  Small bowel is decompressed.
Aorta is normal caliber.  No free fluid, free air or adenopathy.

  No acute bony abnormality.
IMPRESSION: Irregular bladder wall thickening, most pronounced posteriorly.
While this could reflect muscular hypertrophy related to enlarged
prostate, I cannot exclude infiltrative tumor.  There is also a
rounded exophytic bladder wall mass posteriorly concerning for
neoplasm.

Descending colonic and sigmoid diverticulosis.

Postsurgical changes from prostatectomy.

## 2016-04-25 DIAGNOSIS — Z131 Encounter for screening for diabetes mellitus: Secondary | ICD-10-CM | POA: Diagnosis not present

## 2016-04-25 DIAGNOSIS — I10 Essential (primary) hypertension: Secondary | ICD-10-CM | POA: Diagnosis not present

## 2016-04-25 DIAGNOSIS — Z0000 Encounter for general adult medical examination without abnormal findings: Secondary | ICD-10-CM | POA: Diagnosis not present

## 2016-04-25 DIAGNOSIS — E782 Mixed hyperlipidemia: Secondary | ICD-10-CM | POA: Diagnosis not present

## 2016-05-02 DIAGNOSIS — R21 Rash and other nonspecific skin eruption: Secondary | ICD-10-CM | POA: Diagnosis not present

## 2016-05-02 DIAGNOSIS — I8312 Varicose veins of left lower extremity with inflammation: Secondary | ICD-10-CM | POA: Diagnosis not present

## 2016-05-02 DIAGNOSIS — E782 Mixed hyperlipidemia: Secondary | ICD-10-CM | POA: Diagnosis not present

## 2016-05-02 DIAGNOSIS — Z23 Encounter for immunization: Secondary | ICD-10-CM | POA: Diagnosis not present

## 2016-05-02 DIAGNOSIS — I10 Essential (primary) hypertension: Secondary | ICD-10-CM | POA: Diagnosis not present

## 2017-05-15 DIAGNOSIS — I10 Essential (primary) hypertension: Secondary | ICD-10-CM | POA: Diagnosis not present

## 2017-05-15 DIAGNOSIS — Z0000 Encounter for general adult medical examination without abnormal findings: Secondary | ICD-10-CM | POA: Diagnosis not present

## 2017-05-15 DIAGNOSIS — C61 Malignant neoplasm of prostate: Secondary | ICD-10-CM | POA: Diagnosis not present

## 2017-05-15 DIAGNOSIS — Z23 Encounter for immunization: Secondary | ICD-10-CM | POA: Diagnosis not present

## 2017-05-15 DIAGNOSIS — E782 Mixed hyperlipidemia: Secondary | ICD-10-CM | POA: Diagnosis not present

## 2017-05-22 DIAGNOSIS — N401 Enlarged prostate with lower urinary tract symptoms: Secondary | ICD-10-CM | POA: Diagnosis not present

## 2017-05-22 DIAGNOSIS — E782 Mixed hyperlipidemia: Secondary | ICD-10-CM | POA: Diagnosis not present

## 2017-05-22 DIAGNOSIS — I10 Essential (primary) hypertension: Secondary | ICD-10-CM | POA: Diagnosis not present

## 2017-05-22 DIAGNOSIS — I8312 Varicose veins of left lower extremity with inflammation: Secondary | ICD-10-CM | POA: Diagnosis not present

## 2017-05-30 DIAGNOSIS — Z1211 Encounter for screening for malignant neoplasm of colon: Secondary | ICD-10-CM | POA: Diagnosis not present

## 2017-05-30 DIAGNOSIS — Z1212 Encounter for screening for malignant neoplasm of rectum: Secondary | ICD-10-CM | POA: Diagnosis not present

## 2017-07-01 DIAGNOSIS — R195 Other fecal abnormalities: Secondary | ICD-10-CM | POA: Diagnosis not present

## 2018-06-04 DIAGNOSIS — I10 Essential (primary) hypertension: Secondary | ICD-10-CM | POA: Diagnosis not present

## 2018-06-04 DIAGNOSIS — N401 Enlarged prostate with lower urinary tract symptoms: Secondary | ICD-10-CM | POA: Diagnosis not present

## 2018-06-04 DIAGNOSIS — E782 Mixed hyperlipidemia: Secondary | ICD-10-CM | POA: Diagnosis not present

## 2018-06-04 DIAGNOSIS — Z0000 Encounter for general adult medical examination without abnormal findings: Secondary | ICD-10-CM | POA: Diagnosis not present

## 2018-06-11 DIAGNOSIS — R195 Other fecal abnormalities: Secondary | ICD-10-CM | POA: Diagnosis not present

## 2018-06-11 DIAGNOSIS — E782 Mixed hyperlipidemia: Secondary | ICD-10-CM | POA: Diagnosis not present

## 2018-06-11 DIAGNOSIS — N401 Enlarged prostate with lower urinary tract symptoms: Secondary | ICD-10-CM | POA: Diagnosis not present

## 2018-06-11 DIAGNOSIS — Z0000 Encounter for general adult medical examination without abnormal findings: Secondary | ICD-10-CM | POA: Diagnosis not present

## 2018-06-11 DIAGNOSIS — I8312 Varicose veins of left lower extremity with inflammation: Secondary | ICD-10-CM | POA: Diagnosis not present

## 2018-06-11 DIAGNOSIS — I10 Essential (primary) hypertension: Secondary | ICD-10-CM | POA: Diagnosis not present

## 2019-05-15 ENCOUNTER — Ambulatory Visit: Payer: Medicare HMO | Attending: Internal Medicine

## 2019-05-15 DIAGNOSIS — Z23 Encounter for immunization: Secondary | ICD-10-CM | POA: Insufficient documentation

## 2019-05-15 NOTE — Progress Notes (Signed)
   Covid-19 Vaccination Clinic  Name:  Tony Shaffer    MRN: [id number] DOB: [birth year]  05/15/2019  [name] was observed post Covid-19 immunization for 15 minutes without incidence. He was provided with Vaccine Information Sheet and instruction to access the V-Safe system.   [name] was instructed to call 911 with any severe reactions post vaccine: Marland Kitchen Difficulty breathing  . Swelling of your face and throat  . A fast heartbeat  . A bad rash all over your body  . Dizziness and weakness    Immunizations Administered    Name Date Dose VIS Date Route   Pfizer COVID-19 Vaccine 05/15/2019  2:19 PM 0.3 mL 02/27/2019 Intramuscular   Manufacturer: [place]   Lot: [id number]   [place]: [id number]

## 2019-06-10 ENCOUNTER — Ambulatory Visit: Payer: Medicare HMO | Attending: Internal Medicine

## 2019-06-10 DIAGNOSIS — Z23 Encounter for immunization: Secondary | ICD-10-CM

## 2019-06-10 NOTE — Progress Notes (Signed)
   Covid-19 Vaccination Clinic  Name:  BOHDI BLINDER    MRN: [id number] DOB: 1951-02-17  06/10/2019  Mr. Maton was observed post Covid-19 immunization for 15 minutes without incident. He was provided with Vaccine Information Sheet and instruction to access the V-Safe system.   Mr. Doughton was instructed to call 911 with any severe reactions post vaccine: Marland Kitchen Difficulty breathing  . Swelling of face and throat  . A fast heartbeat  . A bad rash all over body  . Dizziness and weakness   Immunizations Administered    Name Date Dose VIS Date Route   Pfizer COVID-19 Vaccine 06/10/2019  8:07 AM 0.3 mL 02/27/2019 Intramuscular   Manufacturer: [place]   Lot: [id number]   [place]: [id number]

## 2019-10-14 ENCOUNTER — Telehealth: Payer: Self-pay

## 2019-10-14 NOTE — Telephone Encounter (Signed)
NOTES ON FILE FROM [address] [phone] SENT REFERRAL TO SCHEDULING

## 2019-11-23 NOTE — Progress Notes (Signed)
Cardiology Office Note:   Date:  11/26/2019  NAME:  Tony Shaffer    MRN: [id number] DOB:  1950/12/23   PCP:  Merrilee Seashore, MD  Cardiologist:  No primary care provider on file.   Referring MD: Sonia Side., FNP   Chief Complaint  Patient presents with  . Abnormal ECG   History of Present Illness:   Tony Shaffer is a 69 y.o. male with a hx of BPH who is being seen today for the evaluation of abnormal EKG at the request of Merrilee Seashore, MD.  He reports he was found to have a right bundle branch block by his primary care physician and then referred to see [place].  He has a medical history significant for hypertension and BPH.  Has had his prostate removed.  He reports that he does not exercise routinely but when he does stretching and weightlifting activities he has no chest pain or shortness of breath.  He reports no lower extremity edema.  He reports he does not feel his heart racing.  His blood pressure is 166/94.  He is only on losartan.  I did inform him that his blood pressure is not controlled.  He may need to pursue additional agents or increase the dose of what he has.  He is a never smoker.  He does not use drugs or drink alcohol.  He is single without children.  He is a retired [employer] consultant.  Overall today he reports no symptoms of chest pain, shortness of breath or palpitations.  There is no history of heart disease in his family.  Past Medical History: Past Medical History:  Diagnosis Date  . BPH (benign prostatic hypertrophy)    URINARY FREQUENCY AND NOCTURIA  . GERD (gastroesophageal reflux disease)    NOTICES GERD WHEN SLEEPING - WAKES HIM UP IF HE EATS SPICY FOODS  . Hypertension   . NGB (new growth of bladder)    PT STATES HE WAS TOLD HE HAS GROWTH IN HIS BLADDER  . Nocturnal leg cramps    NOT MUCH OF A PROBLEM SINCE HIS DOCTOR CHANGED HIS MEDICATION  . Pain    SHARP PAIN IN RT GROIN AT TIMES - PT DOES NOT KNOW THE CAUSE OF THE PAIN  . Seasonal allergies      Past Surgical History: Past Surgical History:  Procedure Laterality Date  . PROSTATECTOMY N/A 10/17/2012   Procedure: TRANSVESICLE PROSTATECTOMY SUPRAPUBIC;  Surgeon: Ailene Rud, MD;  Location: [facility];  Service: Urology;  Laterality: N/A;  . WISDOM TOOTH EXTRACTION      Current Medications: Current Meds  Medication Sig  . aspirin EC 81 MG tablet Take 81 mg by mouth daily. Swallow whole.  . calcium-vitamin D (OSCAL WITH D) 500-200 MG-UNIT tablet Take 1 tablet by mouth.  Marland Kitchen ibuprofen (ADVIL,MOTRIN) 200 MG tablet Take 400 mg by mouth every 8 (eight) hours as needed for pain.  Marland Kitchen losartan (COZAAR) 25 MG tablet   . montelukast (SINGULAIR) 10 MG tablet      Allergies:    Patient has no known allergies.   Social History: Social History   Socioeconomic History  . Marital status: Single    Spouse name: Not on file  . Number of children: Not on file  . Years of education: Not on file  . Highest education level: Not on file  Occupational History  . Occupation: retired  Tobacco Use  . Smoking status: Never Smoker  . Smokeless tobacco: Never Used  Substance and Sexual Activity  . Alcohol use: No  . Drug use: No  . Sexual activity: Not on file  Other Topics Concern  . Not on file  Social History Narrative  . Not on file   Social Determinants of Health   Financial Resource Strain:   . Difficulty of Paying Living Expenses: Not on file  Food Insecurity:   . Worried About Charity fundraiser in the Last Year: Not on file  . Ran Out of Food in the Last Year: Not on file  Transportation Needs:   . Lack of Transportation (Medical): Not on file  . Lack of Transportation (Non-Medical): Not on file  Physical Activity:   . Days of Exercise per Week: Not on file  . Minutes of Exercise per Session: Not on file  Stress:   . Feeling of Stress : Not on file  Social Connections:   . Frequency of Communication with Friends and Family: Not on file  . Frequency of Social  Gatherings with Friends and Family: Not on file  . Attends Religious Services: Not on file  . Active Member of Clubs or Organizations: Not on file  . Attends Archivist Meetings: Not on file  . Marital Status: Not on file     Family History: The patient's family history includes Heart disease in his mother.  ROS:   All other ROS reviewed and negative. Pertinent positives noted in the HPI.     EKGs/Labs/Other Studies Reviewed:   The following studies were personally reviewed by me today:  EKG:  EKG is ordered today.  The ekg ordered today demonstrates normal sinus rhythm heart rate 74, right bundle branch block noted, no acute ischemic change or evidence of prior infarction, and was personally reviewed by me.   Recent Labs: No results found for requested labs within last 8760 hours.   Recent Lipid Panel No results found for: CHOL, TRIG, HDL, CHOLHDL, VLDL, LDLCALC, LDLDIRECT  Physical Exam:   VS:  BP (!) 166/94   Pulse 74   Ht 5\' 9"  (1.753 m)   Wt 173 lb 12.8 oz (78.8 kg)   SpO2 99%   BMI 25.67 kg/m    Wt Readings from Last 3 Encounters:  11/26/19 173 lb 12.8 oz (78.8 kg)  10/17/12 175 lb (79.4 kg)  10/06/12 179 lb (81.2 kg)    General: Well nourished, well developed, in no acute distress Heart: Atraumatic, normal size  Eyes: PEERLA, EOMI  Neck: Supple, no JVD Endocrine: No thryomegaly Cardiac: Normal S1, S2; RRR; no murmurs, rubs, or gallops Lungs: Clear to auscultation bilaterally, no wheezing, rhonchi or rales  Abd: Soft, nontender, no hepatomegaly  Ext: No edema, pulses 2+ Musculoskeletal: No deformities, BUE and BLE strength normal and equal Skin: Warm and dry, no rashes   Neuro: Alert and oriented to person, place, time, and situation, CNII-XII grossly intact, no focal deficits  Psych: Normal mood and affect   ASSESSMENT:   Tony Shaffer is a 69 y.o. male who presents for the following: 1. Nonspecific abnormal electrocardiogram (ECG) (EKG)   2.  RBBB     PLAN:   1. Nonspecific abnormal electrocardiogram (ECG) (EKG) 2. RBBB -EKG demonstrates right bundle branch block.  He has no symptoms of cardiovascular disease.  His cardiovascular examination is normal without murmurs rubs or gallops.  I have recommended an echocardiogram just to make sure everything is structurally normal.  I do not suspect to have any structural heart disease.  I  will follow-up the results of this by phone with him. -I have recommended that he follow-up with his primary care physician regarding his blood pressure.  Is not controlled today.  He may need to further titrate medications or pursue amlodipine.  Amlodipine would be a great choice is a second agent if needed.  Disposition: Return if symptoms worsen or fail to improve.  Medication Adjustments/Labs and Tests Ordered: Current medicines are reviewed at length with the patient today.  Concerns regarding medicines are outlined above.  Orders Placed This Encounter  Procedures  . EKG 12-Lead  . ECHOCARDIOGRAM COMPLETE   No orders of the defined types were placed in this encounter.   Patient Instructions  Medication Instructions:  The current medical regimen is effective;  continue present plan and medications.  *If you need a refill on your cardiac medications before your next appointment, please call your pharmacy*  Testing/Procedures: Echocardiogram - Your physician has requested that you have an echocardiogram. Echocardiography is a painless test that uses sound waves to create images of your heart. It provides your doctor with information about the size and shape of your heart and how well your heart's chambers and valves are working. This procedure takes approximately one hour. There are no restrictions for this procedure. This will be performed at our [facility] location - [address], [address].    Follow-Up: At [facility], you and your health needs are our priority.  As part of our  continuing mission to provide you with exceptional heart care, we have created designated Provider Care Teams.  These Care Teams include your primary Cardiologist (physician) and Advanced Practice Providers (APPs -  Physician Assistants and Nurse Practitioners) who all work together to provide you with the care you need, when you need it.  We recommend signing up for the patient portal called "MyChart".  Sign up information is provided on this After Visit Summary.  MyChart is used to connect with patients for Virtual Visits (Telemedicine).  Patients are able to view lab/test results, encounter notes, upcoming appointments, etc.  Non-urgent messages can be sent to your provider as well.   To learn more about what you can do with MyChart, go to [email or web address].    Your next appointment:   As needed  The format for your next appointment:   In Person  Provider:   Eleonore Chiquito, MD      Signed, Addison Naegeli. Audie Box, [place]  [address], [place], [place] [address] [phone]  11/26/2019 1:31 PM

## 2019-11-26 ENCOUNTER — Encounter: Payer: Self-pay | Admitting: Cardiovascular Disease

## 2019-11-26 ENCOUNTER — Ambulatory Visit: Payer: Medicare HMO | Admitting: Cardiovascular Disease

## 2019-11-26 ENCOUNTER — Other Ambulatory Visit: Payer: Self-pay

## 2019-11-26 VITALS — BP 166/94 | HR 74 | Ht 69.0 in | Wt 173.8 lb

## 2019-11-26 DIAGNOSIS — R9431 Abnormal electrocardiogram [ECG] [EKG]: Secondary | ICD-10-CM

## 2019-11-26 DIAGNOSIS — I4510 Unspecified right bundle-branch block: Secondary | ICD-10-CM | POA: Diagnosis not present

## 2019-11-26 NOTE — Patient Instructions (Signed)
Medication Instructions:  The current medical regimen is effective;  continue present plan and medications.  *If you need a refill on your cardiac medications before your next appointment, please call your pharmacy*   Testing/Procedures: Echocardiogram - Your physician has requested that you have an echocardiogram. Echocardiography is a painless test that uses sound waves to create images of your heart. It provides your doctor with information about the size and shape of your heart and how well your heart's chambers and valves are working. This procedure takes approximately one hour. There are no restrictions for this procedure. This will be performed at our [address] location - [address], [address].    Follow-Up: At [facility], you and your health needs are our priority.  As part of our continuing mission to provide you with exceptional heart care, we have created designated Provider Care Teams.  These Care Teams include your primary Cardiologist (physician) and Advanced Practice Providers (APPs -  Physician Assistants and Nurse Practitioners) who all work together to provide you with the care you need, when you need it.  We recommend signing up for the patient portal called "MyChart".  Sign up information is provided on this After Visit Summary.  MyChart is used to connect with patients for Virtual Visits (Telemedicine).  Patients are able to view lab/test results, encounter notes, upcoming appointments, etc.  Non-urgent messages can be sent to your provider as well.   To learn more about what you can do with MyChart, go to [email or web address].    Your next appointment:   As needed  The format for your next appointment:   In Person  Provider:   Huntley O'Neal, MD      

## 2019-12-11 ENCOUNTER — Other Ambulatory Visit: Payer: Self-pay

## 2019-12-11 ENCOUNTER — Ambulatory Visit (HOSPITAL_COMMUNITY): Payer: Medicare HMO | Attending: Cardiology

## 2019-12-11 DIAGNOSIS — I4510 Unspecified right bundle-branch block: Secondary | ICD-10-CM | POA: Insufficient documentation

## 2019-12-11 LAB — ECHOCARDIOGRAM COMPLETE
Area-P 1/2: 4.15 cm2
S' Lateral: 2.3 cm

## 2020-08-19 ENCOUNTER — Ambulatory Visit
Admission: RE | Admit: 2020-08-19 | Discharge: 2020-08-19 | Disposition: A | Discharge status: Home / Self Care | Payer: Medicare HMO | Source: Ambulatory Visit | Attending: Family | Admitting: Family

## 2020-08-19 ENCOUNTER — Other Ambulatory Visit: Payer: Self-pay | Admitting: Family

## 2020-08-19 DIAGNOSIS — M5440 Lumbago with sciatica, unspecified side: Secondary | ICD-10-CM

## 2020-08-23 ENCOUNTER — Encounter (HOSPITAL_COMMUNITY): Payer: Self-pay

## 2020-08-23 ENCOUNTER — Emergency Department (HOSPITAL_COMMUNITY): Payer: Medicare HMO

## 2020-08-23 ENCOUNTER — Other Ambulatory Visit: Payer: Self-pay

## 2020-08-23 ENCOUNTER — Inpatient Hospital Stay (HOSPITAL_COMMUNITY)
Admission: EM | Admit: 2020-08-23 | Discharge: 2020-08-31 | DRG: 234 | Disposition: A | Discharge status: Home Health | Payer: Medicare HMO | Source: Ambulatory Visit | Attending: Cardiothoracic Surgery | Admitting: Cardiothoracic Surgery

## 2020-08-23 DIAGNOSIS — I25110 Atherosclerotic heart disease of native coronary artery with unstable angina pectoris: Secondary | ICD-10-CM | POA: Diagnosis present

## 2020-08-23 DIAGNOSIS — R778 Other specified abnormalities of plasma proteins: Secondary | ICD-10-CM

## 2020-08-23 DIAGNOSIS — Z7982 Long term (current) use of aspirin: Secondary | ICD-10-CM

## 2020-08-23 DIAGNOSIS — Z8249 Family history of ischemic heart disease and other diseases of the circulatory system: Secondary | ICD-10-CM

## 2020-08-23 DIAGNOSIS — I2584 Coronary atherosclerosis due to calcified coronary lesion: Secondary | ICD-10-CM | POA: Diagnosis present

## 2020-08-23 DIAGNOSIS — Z20822 Contact with and (suspected) exposure to covid-19: Secondary | ICD-10-CM | POA: Diagnosis present

## 2020-08-23 DIAGNOSIS — D696 Thrombocytopenia, unspecified: Secondary | ICD-10-CM | POA: Diagnosis not present

## 2020-08-23 DIAGNOSIS — I10 Essential (primary) hypertension: Secondary | ICD-10-CM | POA: Diagnosis present

## 2020-08-23 DIAGNOSIS — J302 Other seasonal allergic rhinitis: Secondary | ICD-10-CM | POA: Diagnosis present

## 2020-08-23 DIAGNOSIS — R7303 Prediabetes: Secondary | ICD-10-CM | POA: Diagnosis present

## 2020-08-23 DIAGNOSIS — R252 Cramp and spasm: Secondary | ICD-10-CM | POA: Diagnosis present

## 2020-08-23 DIAGNOSIS — Z79899 Other long term (current) drug therapy: Secondary | ICD-10-CM

## 2020-08-23 DIAGNOSIS — Z9889 Other specified postprocedural states: Secondary | ICD-10-CM

## 2020-08-23 DIAGNOSIS — R7989 Other specified abnormal findings of blood chemistry: Secondary | ICD-10-CM

## 2020-08-23 DIAGNOSIS — D494 Neoplasm of unspecified behavior of bladder: Secondary | ICD-10-CM | POA: Diagnosis present

## 2020-08-23 DIAGNOSIS — E8770 Fluid overload, unspecified: Secondary | ICD-10-CM | POA: Diagnosis not present

## 2020-08-23 DIAGNOSIS — I2510 Atherosclerotic heart disease of native coronary artery without angina pectoris: Secondary | ICD-10-CM | POA: Diagnosis present

## 2020-08-23 DIAGNOSIS — R350 Frequency of micturition: Secondary | ICD-10-CM | POA: Diagnosis present

## 2020-08-23 DIAGNOSIS — Z9079 Acquired absence of other genital organ(s): Secondary | ICD-10-CM

## 2020-08-23 DIAGNOSIS — I200 Unstable angina: Secondary | ICD-10-CM

## 2020-08-23 DIAGNOSIS — J939 Pneumothorax, unspecified: Secondary | ICD-10-CM

## 2020-08-23 DIAGNOSIS — I4510 Unspecified right bundle-branch block: Secondary | ICD-10-CM | POA: Diagnosis present

## 2020-08-23 DIAGNOSIS — I214 Non-ST elevation (NSTEMI) myocardial infarction: Secondary | ICD-10-CM | POA: Diagnosis present

## 2020-08-23 DIAGNOSIS — J9811 Atelectasis: Secondary | ICD-10-CM | POA: Diagnosis not present

## 2020-08-23 DIAGNOSIS — D62 Acute posthemorrhagic anemia: Secondary | ICD-10-CM | POA: Diagnosis not present

## 2020-08-23 DIAGNOSIS — K219 Gastro-esophageal reflux disease without esophagitis: Secondary | ICD-10-CM | POA: Diagnosis present

## 2020-08-23 DIAGNOSIS — N400 Enlarged prostate without lower urinary tract symptoms: Secondary | ICD-10-CM | POA: Diagnosis present

## 2020-08-23 DIAGNOSIS — Z951 Presence of aortocoronary bypass graft: Secondary | ICD-10-CM

## 2020-08-23 DIAGNOSIS — R079 Chest pain, unspecified: Secondary | ICD-10-CM | POA: Diagnosis present

## 2020-08-23 LAB — CBC WITH DIFFERENTIAL/PLATELET
Abs Immature Granulocytes: 0.03 10*3/uL (ref 0.00–0.07)
Basophils Absolute: 0.1 10*3/uL (ref 0.0–0.1)
Basophils Relative: 1 %
Eosinophils Absolute: 0.2 10*3/uL (ref 0.0–0.5)
Eosinophils Relative: 3 %
HCT: 43.1 % (ref 39.0–52.0)
Hemoglobin: 13.8 g/dL (ref 13.0–17.0)
Immature Granulocytes: 0 %
Lymphocytes Relative: 28 %
Lymphs Abs: 2 10*3/uL (ref 0.7–4.0)
MCH: 28.2 pg (ref 26.0–34.0)
MCHC: 32 g/dL (ref 30.0–36.0)
MCV: 88.1 fL (ref 80.0–100.0)
Monocytes Absolute: 0.5 10*3/uL (ref 0.1–1.0)
Monocytes Relative: 7 %
Neutro Abs: 4.4 10*3/uL (ref 1.7–7.7)
Neutrophils Relative %: 61 %
Platelets: 279 10*3/uL (ref 150–400)
RBC: 4.89 MIL/uL (ref 4.22–5.81)
RDW: 13.2 % (ref 11.5–15.5)
WBC: 7.2 10*3/uL (ref 4.0–10.5)
nRBC: 0 % (ref 0.0–0.2)

## 2020-08-23 LAB — BRAIN NATRIURETIC PEPTIDE: B Natriuretic Peptide: 56.6 pg/mL (ref 0.0–100.0)

## 2020-08-23 LAB — COMPREHENSIVE METABOLIC PANEL
ALT: 32 U/L (ref 0–44)
AST: 22 U/L (ref 15–41)
Albumin: 4.3 g/dL (ref 3.5–5.0)
Alkaline Phosphatase: 56 U/L (ref 38–126)
Anion gap: 7 (ref 5–15)
BUN: 15 mg/dL (ref 8–23)
CO2: 24 mmol/L (ref 22–32)
Calcium: 9.3 mg/dL (ref 8.9–10.3)
Chloride: 105 mmol/L (ref 98–111)
Creatinine, Ser: 1.22 mg/dL (ref 0.61–1.24)
GFR, Estimated: >60 mL/min (ref >60)
Glucose, Bld: 136 mg/dL — ABNORMAL HIGH (ref 70–99)
Potassium: 3.9 mmol/L (ref 3.5–5.1)
Sodium: 136 mmol/L (ref 135–145)
Total Bilirubin: 0.5 mg/dL (ref 0.3–1.2)
Total Protein: 7.5 g/dL (ref 6.5–8.1)

## 2020-08-23 LAB — D-DIMER, QUANTITATIVE: D-Dimer, Quant: <0.27 ug/mL-FEU (ref 0.00–0.50)

## 2020-08-23 LAB — TROPONIN I (HIGH SENSITIVITY): Troponin I (High Sensitivity): 52 ng/L — ABNORMAL HIGH (ref <18)

## 2020-08-23 NOTE — ED Triage Notes (Signed)
Pt presents from home, c/o chest pain and tightness upon exertion over the last couple of months. Saw PCP on Friday and had blood work and imaging and was told to come in due to a d-dimer result of 0.59.

## 2020-08-23 NOTE — ED Provider Notes (Signed)
Emergency Medicine Provider Triage Evaluation Note  EMARION TORAL 70 y.o. male was evaluated in triage.  Pt complains of abnormal blood work.  Patient reports that over the last few months, he has had intermittent chest pain.  He states that he notices more when he walks to the mailbox or when he is out walking his dog.  He saw his PCP last weekend and they did blood work.  He was called today and told that he had an elevated D-dimer and that he needed to go to the emergency department.  He reports his last episode of chest pain was today.  Currently denies any chest pain.  He states sometimes when he gets the chest pain, he gets nauseous, short of breath.  He denies any fevers, cough, abdominal pain, vomiting.  He denies any recent travel or surgeries.  He feels like his ankles have been swollen recently.   Review of Systems  Positive: Chest pain, shortness of breath, nausea Negative: Fevers, cough, abdominal pain, nausea/vomiting.  Physical Exam  BP 134/82   Pulse 70   Temp 98.2 F (36.8 C) (Oral)   Resp 18   Ht 5\' 4"  (1.626 m)   Wt 65.8 kg   SpO2 100%   BMI 24.89 kg/m  Gen:   Awake, no distress   HEENT:  Atraumatic  Resp:  Normal effort.  No evidence of respiratory distress. Cardiac:  Normal rate  Abd:   Nondistended, nontender  MSK:   Moves extremities without difficulty  Neuro:  Speech clear   Other:      Medical Decision Making  Medically screening exam initiated at 8:56 PM  Appropriate orders placed.  [name] was informed that the remainder of the evaluation will be completed by another provider, this initial triage assessment does not replace that evaluation. They are counseled that they will need to remain in the ED until the completion of their workup, including full H&P and results of any tests.  Risks of leaving the emergency department prior to completion of treatment were discussed. Patient was advised to inform ED staff if they are leaving before their  treatment is complete. The patient acknowledged these risks and time was allowed for questions.     The patient appears stable so that the remainder of the MSE may be completed by another provider.  Clinical Impression  Chest pain   Portions of this note were generated with Dragon dictation software. Dictation errors may occur despite best attempts at proofreading.      Volanda Napoleon, PA-C 08/23/20 2058    Milton Ferguson, MD 08/23/20 2337

## 2020-08-24 ENCOUNTER — Encounter (HOSPITAL_COMMUNITY): Payer: Self-pay | Admitting: Internal Medicine

## 2020-08-24 ENCOUNTER — Inpatient Hospital Stay (HOSPITAL_COMMUNITY)
Admission: EM | Disposition: A | Discharge status: Home Health | Payer: Self-pay | Source: Ambulatory Visit | Attending: Cardiothoracic Surgery

## 2020-08-24 DIAGNOSIS — R7989 Other specified abnormal findings of blood chemistry: Secondary | ICD-10-CM | POA: Diagnosis present

## 2020-08-24 DIAGNOSIS — I200 Unstable angina: Secondary | ICD-10-CM | POA: Diagnosis not present

## 2020-08-24 DIAGNOSIS — D494 Neoplasm of unspecified behavior of bladder: Secondary | ICD-10-CM | POA: Diagnosis present

## 2020-08-24 DIAGNOSIS — Z79899 Other long term (current) drug therapy: Secondary | ICD-10-CM | POA: Diagnosis not present

## 2020-08-24 DIAGNOSIS — R079 Chest pain, unspecified: Secondary | ICD-10-CM

## 2020-08-24 DIAGNOSIS — I4510 Unspecified right bundle-branch block: Secondary | ICD-10-CM | POA: Diagnosis present

## 2020-08-24 DIAGNOSIS — Z7982 Long term (current) use of aspirin: Secondary | ICD-10-CM | POA: Diagnosis not present

## 2020-08-24 DIAGNOSIS — I2584 Coronary atherosclerosis due to calcified coronary lesion: Secondary | ICD-10-CM | POA: Diagnosis present

## 2020-08-24 DIAGNOSIS — I252 Old myocardial infarction: Secondary | ICD-10-CM

## 2020-08-24 DIAGNOSIS — D62 Acute posthemorrhagic anemia: Secondary | ICD-10-CM | POA: Diagnosis not present

## 2020-08-24 DIAGNOSIS — Z20822 Contact with and (suspected) exposure to covid-19: Secondary | ICD-10-CM | POA: Diagnosis present

## 2020-08-24 DIAGNOSIS — I214 Non-ST elevation (NSTEMI) myocardial infarction: Secondary | ICD-10-CM | POA: Diagnosis present

## 2020-08-24 DIAGNOSIS — Z8249 Family history of ischemic heart disease and other diseases of the circulatory system: Secondary | ICD-10-CM | POA: Diagnosis not present

## 2020-08-24 DIAGNOSIS — N400 Enlarged prostate without lower urinary tract symptoms: Secondary | ICD-10-CM | POA: Diagnosis present

## 2020-08-24 DIAGNOSIS — R252 Cramp and spasm: Secondary | ICD-10-CM | POA: Diagnosis present

## 2020-08-24 DIAGNOSIS — I2510 Atherosclerotic heart disease of native coronary artery without angina pectoris: Secondary | ICD-10-CM

## 2020-08-24 DIAGNOSIS — R350 Frequency of micturition: Secondary | ICD-10-CM | POA: Diagnosis present

## 2020-08-24 DIAGNOSIS — R7303 Prediabetes: Secondary | ICD-10-CM | POA: Diagnosis present

## 2020-08-24 DIAGNOSIS — I10 Essential (primary) hypertension: Secondary | ICD-10-CM | POA: Diagnosis present

## 2020-08-24 DIAGNOSIS — K219 Gastro-esophageal reflux disease without esophagitis: Secondary | ICD-10-CM | POA: Diagnosis present

## 2020-08-24 DIAGNOSIS — J302 Other seasonal allergic rhinitis: Secondary | ICD-10-CM | POA: Diagnosis present

## 2020-08-24 DIAGNOSIS — J9811 Atelectasis: Secondary | ICD-10-CM | POA: Diagnosis not present

## 2020-08-24 DIAGNOSIS — Z01810 Encounter for preprocedural cardiovascular examination: Secondary | ICD-10-CM | POA: Diagnosis not present

## 2020-08-24 DIAGNOSIS — I25110 Atherosclerotic heart disease of native coronary artery with unstable angina pectoris: Secondary | ICD-10-CM | POA: Diagnosis present

## 2020-08-24 DIAGNOSIS — E8770 Fluid overload, unspecified: Secondary | ICD-10-CM | POA: Diagnosis not present

## 2020-08-24 DIAGNOSIS — D696 Thrombocytopenia, unspecified: Secondary | ICD-10-CM | POA: Diagnosis not present

## 2020-08-24 DIAGNOSIS — Z9079 Acquired absence of other genital organ(s): Secondary | ICD-10-CM | POA: Diagnosis not present

## 2020-08-24 HISTORY — PX: LEFT HEART CATH AND CORONARY ANGIOGRAPHY: CATH118249

## 2020-08-24 HISTORY — DX: Old myocardial infarction: I25.2

## 2020-08-24 LAB — CBC
HCT: 42.3 % (ref 39.0–52.0)
Hemoglobin: 13.5 g/dL (ref 13.0–17.0)
MCH: 28.4 pg (ref 26.0–34.0)
MCHC: 31.9 g/dL (ref 30.0–36.0)
MCV: 88.9 fL (ref 80.0–100.0)
Platelets: 266 10*3/uL (ref 150–400)
RBC: 4.76 MIL/uL (ref 4.22–5.81)
RDW: 13.2 % (ref 11.5–15.5)
WBC: 6.9 10*3/uL (ref 4.0–10.5)
nRBC: 0 % (ref 0.0–0.2)

## 2020-08-24 LAB — RESP PANEL BY RT-PCR (FLU A&B, COVID) ARPGX2
Influenza A by PCR: NEGATIVE
Influenza B by PCR: NEGATIVE
SARS Coronavirus 2 by RT PCR: NEGATIVE

## 2020-08-24 LAB — LIPID PANEL
Cholesterol: 201 mg/dL — ABNORMAL HIGH (ref 0–200)
HDL: 53 mg/dL (ref >40)
LDL Cholesterol: 122 mg/dL — ABNORMAL HIGH (ref 0–99)
Total CHOL/HDL Ratio: 3.8 RATIO
Triglycerides: 131 mg/dL (ref <150)
VLDL: 26 mg/dL (ref 0–40)

## 2020-08-24 LAB — HEPARIN LEVEL (UNFRACTIONATED): Heparin Unfractionated: 0.79 IU/mL — ABNORMAL HIGH (ref 0.30–0.70)

## 2020-08-24 LAB — TROPONIN I (HIGH SENSITIVITY): Troponin I (High Sensitivity): 85 ng/L — ABNORMAL HIGH (ref <18)

## 2020-08-24 SURGERY — LEFT HEART CATH AND CORONARY ANGIOGRAPHY
Anesthesia: LOCAL

## 2020-08-24 MED ORDER — HEPARIN SODIUM (PORCINE) 1000 UNIT/ML IJ SOLN
INTRAMUSCULAR | Status: AC
  Filled 2020-08-24: qty 1

## 2020-08-24 MED ORDER — HEPARIN BOLUS VIA INFUSION
4000.0000 [IU] | Freq: Once | INTRAVENOUS | Status: AC
  Administered 2020-08-24: 4000 [IU] via INTRAVENOUS
  Filled 2020-08-24: qty 4000

## 2020-08-24 MED ORDER — NITROGLYCERIN 0.4 MG SL SUBL: 0.4000 mg | SUBLINGUAL_TABLET | SUBLINGUAL | Status: DC | PRN

## 2020-08-24 MED ORDER — ONDANSETRON HCL 4 MG/2ML IJ SOLN: 4.0000 mg | Freq: Four times a day (QID) | INTRAMUSCULAR | Status: DC | PRN

## 2020-08-24 MED ORDER — PANTOPRAZOLE SODIUM 40 MG PO TBEC
40.0000 mg | DELAYED_RELEASE_TABLET | Freq: Two times a day (BID) | ORAL | Status: DC
  Administered 2020-08-24 – 2020-08-25 (×3): 40 mg via ORAL
  Filled 2020-08-24 (×3): qty 1

## 2020-08-24 MED ORDER — SODIUM CHLORIDE 0.9 % IV SOLN: 250.0000 mL | INTRAVENOUS | Status: DC | PRN

## 2020-08-24 MED ORDER — MELATONIN 3 MG PO TABS
3.0000 mg | ORAL_TABLET | Freq: Every evening | ORAL | Status: DC | PRN
  Administered 2020-08-24 – 2020-08-28 (×3): 3 mg via ORAL
  Filled 2020-08-24 (×3): qty 1

## 2020-08-24 MED ORDER — HEPARIN (PORCINE) 25000 UT/250ML-% IV SOLN
1100.0000 [IU]/h | INTRAVENOUS | Status: DC
  Administered 2020-08-24: 850 [IU]/h via INTRAVENOUS
  Administered 2020-08-26: 1100 [IU]/h via INTRAVENOUS
  Filled 2020-08-24 (×2): qty 250

## 2020-08-24 MED ORDER — MIDAZOLAM HCL 2 MG/2ML IJ SOLN
INTRAMUSCULAR | Status: AC
  Filled 2020-08-24: qty 2

## 2020-08-24 MED ORDER — LABETALOL HCL 5 MG/ML IV SOLN
10.0000 mg | INTRAVENOUS | Status: AC | PRN
  Administered 2020-08-24: 10 mg via INTRAVENOUS
  Filled 2020-08-24: qty 4

## 2020-08-24 MED ORDER — ATORVASTATIN CALCIUM 40 MG PO TABS
40.0000 mg | ORAL_TABLET | Freq: Every day | ORAL | Status: DC
  Administered 2020-08-24 – 2020-08-31 (×7): 40 mg via ORAL
  Filled 2020-08-24 (×7): qty 1

## 2020-08-24 MED ORDER — HEPARIN (PORCINE) 25000 UT/250ML-% IV SOLN
850.0000 [IU]/h | INTRAVENOUS | Status: DC
  Administered 2020-08-24: 1000 [IU]/h via INTRAVENOUS
  Filled 2020-08-24: qty 250

## 2020-08-24 MED ORDER — FENTANYL CITRATE (PF) 100 MCG/2ML IJ SOLN
INTRAMUSCULAR | Status: AC
  Filled 2020-08-24: qty 2

## 2020-08-24 MED ORDER — HEPARIN (PORCINE) IN NACL 1000-0.9 UT/500ML-% IV SOLN
INTRAVENOUS | Status: AC
  Filled 2020-08-24: qty 1000

## 2020-08-24 MED ORDER — SODIUM CHLORIDE 0.9 % IV SOLN: INTRAVENOUS | Status: DC

## 2020-08-24 MED ORDER — SODIUM CHLORIDE 0.9% FLUSH: 3.0000 mL | Freq: Two times a day (BID) | INTRAVENOUS | Status: DC

## 2020-08-24 MED ORDER — SODIUM CHLORIDE 0.9% FLUSH: 3.0000 mL | INTRAVENOUS | Status: DC | PRN

## 2020-08-24 MED ORDER — MIDAZOLAM HCL 2 MG/2ML IJ SOLN
INTRAMUSCULAR | Status: DC | PRN
  Administered 2020-08-24: 2 mg via INTRAVENOUS

## 2020-08-24 MED ORDER — HYDRALAZINE HCL 20 MG/ML IJ SOLN: 10.0000 mg | INTRAMUSCULAR | Status: AC | PRN

## 2020-08-24 MED ORDER — FENTANYL CITRATE (PF) 100 MCG/2ML IJ SOLN
INTRAMUSCULAR | Status: DC | PRN
  Administered 2020-08-24: 50 ug via INTRAVENOUS

## 2020-08-24 MED ORDER — HEPARIN SODIUM (PORCINE) 1000 UNIT/ML IJ SOLN
INTRAMUSCULAR | Status: DC | PRN
  Administered 2020-08-24: 4000 [IU] via INTRAVENOUS

## 2020-08-24 MED ORDER — ASPIRIN 81 MG PO CHEW
324.0000 mg | CHEWABLE_TABLET | Freq: Once | ORAL | Status: AC
  Administered 2020-08-24: 324 mg via ORAL
  Filled 2020-08-24: qty 4

## 2020-08-24 MED ORDER — LIDOCAINE HCL (PF) 1 % IJ SOLN
INTRAMUSCULAR | Status: DC | PRN
  Administered 2020-08-24: 2 mL

## 2020-08-24 MED ORDER — LIDOCAINE HCL (PF) 1 % IJ SOLN
INTRAMUSCULAR | Status: AC
  Filled 2020-08-24: qty 30

## 2020-08-24 MED ORDER — FLUTICASONE PROPIONATE 50 MCG/ACT NA SUSP
2.0000 | Freq: Two times a day (BID) | NASAL | Status: DC
  Administered 2020-08-24 – 2020-08-31 (×10): 2 via NASAL
  Filled 2020-08-24 (×3): qty 16

## 2020-08-24 MED ORDER — IOHEXOL 350 MG/ML SOLN
INTRAVENOUS | Status: DC | PRN
  Administered 2020-08-24: 63 mL

## 2020-08-24 MED ORDER — TIZANIDINE HCL 4 MG PO TABS: 4.0000 mg | ORAL_TABLET | Freq: Three times a day (TID) | ORAL | Status: DC | PRN

## 2020-08-24 MED ORDER — HEPARIN (PORCINE) IN NACL 1000-0.9 UT/500ML-% IV SOLN
INTRAVENOUS | Status: DC | PRN
  Administered 2020-08-24 (×2): 500 mL

## 2020-08-24 MED ORDER — LOSARTAN POTASSIUM 25 MG PO TABS
25.0000 mg | ORAL_TABLET | Freq: Every day | ORAL | Status: DC
  Administered 2020-08-24 – 2020-08-25 (×2): 25 mg via ORAL
  Filled 2020-08-24 (×2): qty 1

## 2020-08-24 MED ORDER — ACETAMINOPHEN 325 MG PO TABS
650.0000 mg | ORAL_TABLET | ORAL | Status: DC | PRN
  Administered 2020-08-24: 650 mg via ORAL
  Filled 2020-08-24: qty 2

## 2020-08-24 MED ORDER — VERAPAMIL HCL 2.5 MG/ML IV SOLN
INTRAVENOUS | Status: AC
  Filled 2020-08-24: qty 2

## 2020-08-24 MED ORDER — ASPIRIN EC 81 MG PO TBEC
81.0000 mg | DELAYED_RELEASE_TABLET | Freq: Every day | ORAL | Status: DC
  Administered 2020-08-25: 81 mg via ORAL
  Filled 2020-08-24: qty 1

## 2020-08-24 SURGICAL SUPPLY — 11 items
CATH 5FR JL3.5 JR4 ANG PIG MP (CATHETERS) ×1 IMPLANT
DEVICE RAD COMP TR BAND LRG (VASCULAR PRODUCTS) ×1 IMPLANT
ELECT DEFIB PAD ADLT CADENCE (PAD) ×1 IMPLANT
GLIDESHEATH SLEND SS 6F .021 (SHEATH) ×2 IMPLANT
GUIDEWIRE INQWIRE 1.5J.035X260 (WIRE) IMPLANT
INQWIRE 1.5J .035X260CM (WIRE) ×2
KIT ENCORE 26 ADVANTAGE (KITS) ×1 IMPLANT
KIT HEART LEFT (KITS) ×2 IMPLANT
PACK CARDIAC CATHETERIZATION (CUSTOM PROCEDURE TRAY) ×2 IMPLANT
TRANSDUCER W/STOPCOCK (MISCELLANEOUS) ×2 IMPLANT
TUBING CIL FLEX 10 FLL-RA (TUBING) ×2 IMPLANT

## 2020-08-24 NOTE — ED Provider Notes (Addendum)
[facility] DEPT Provider Note: Tony Spurling, MD, FACEP  CSN: [id number] MRN: [id number] ARRIVAL: 08/23/20 at 2040 ROOM: WA24/WA24   CHIEF COMPLAINT  Chest Pain   HISTORY OF PRESENT ILLNESS  08/24/20 12:29 AM Tony Shaffer is a 70 y.o. male who has had intermittent chest pain over the past few months.  He notices it mostly when he is ambulating.  He saw his PCP last weekend and they did blood work.  He was called yesterday and told he had an elevated D-dimer and that he needed to go to the emergency department.  His last episode of chest pain was yesterday while he was playing with his dogs.  He states it was "pretty bad" but went away after resting about 7 minutes.  The chest pain is in his precordium radiating bilaterally with associated left arm numbness.  He is having no pain at the present time.  When he does get the chest pain he gets nauseated and short of breath.  He has been having some bilateral lower back pain recently but his PCP started him on some medication which has helped.  Past Medical History:  Diagnosis Date   BPH (benign prostatic hypertrophy)    URINARY FREQUENCY AND NOCTURIA   GERD (gastroesophageal reflux disease)    NOTICES GERD WHEN SLEEPING - WAKES HIM UP IF HE EATS SPICY FOODS   Hypertension    NGB (new growth of bladder)    PT STATES HE WAS TOLD HE HAS GROWTH IN HIS BLADDER   Nocturnal leg cramps    NOT MUCH OF A PROBLEM SINCE HIS DOCTOR CHANGED HIS MEDICATION   Pain    SHARP PAIN IN RT GROIN AT TIMES - PT DOES NOT KNOW THE CAUSE OF THE PAIN   Seasonal allergies     Past Surgical History:  Procedure Laterality Date   PROSTATECTOMY N/A 10/17/2012   Procedure: TRANSVESICLE PROSTATECTOMY SUPRAPUBIC;  Surgeon: Ailene Rud, MD;  Location: [facility];  Service: Urology;  Laterality: N/A;   WISDOM TOOTH EXTRACTION      Family History  Problem Relation Age of Onset   Heart disease Mother     Social History   Tobacco Use   Smoking status:  Never Smoker   Smokeless tobacco: Never Used  Substance Use Topics   Alcohol use: No   Drug use: No    Prior to Admission medications   Medication Sig Start Date End Date Taking? Authorizing Provider  aspirin EC 81 MG tablet Take 81 mg by mouth daily. Swallow whole.   Yes [provider]  fluticasone (FLONASE) 50 MCG/ACT nasal spray Place 2 sprays into both nostrils 2 (two) times daily.   Yes [provider]  ibuprofen (ADVIL,MOTRIN) 200 MG tablet Take 400 mg by mouth every 8 (eight) hours as needed for pain.   Yes [provider]  losartan (COZAAR) 25 MG tablet Take 25 mg by mouth daily. 10/13/19  Yes [provider]  pantoprazole (PROTONIX) 40 MG tablet Take 1 tablet by mouth 2 (two) times daily. 08/19/20  Yes [provider]  tiZANidine (ZANAFLEX) 4 MG tablet Take 4 mg by mouth every 8 (eight) hours as needed. 08/19/20  Yes [provider]  calcium-vitamin D (OSCAL WITH D) 500-200 MG-UNIT tablet Take 1 tablet by mouth.    [provider]    Allergies Patient has no known allergies.   REVIEW OF SYSTEMS  Negative except as noted here or in the History of Present Illness.  PHYSICAL EXAMINATION  Initial Vital Signs Blood pressure (!) 186/91, pulse 73, temperature 98.4 F (36.9 C), temperature source Oral, resp. rate 16, height 5\' 9"  (1.753 m), weight 78 kg, SpO2 100 %.  Examination General: Well-developed, well-nourished male in no acute distress; appearance consistent with age of record HENT: normocephalic; atraumatic Eyes: pupils equal, round and reactive to light; extraocular muscles intact Neck: supple Heart: regular rate and rhythm; no murmurs, rubs or gallops Lungs: clear to auscultation bilaterally Abdomen: soft; nondistended; nontender; no masses or hepatosplenomegaly; bowel sounds present Extremities: No deformity; full range of motion; pulses normal Neurologic: Awake, alert and oriented; motor function intact  in all extremities and symmetric; no facial droop Skin: Warm and dry Psychiatric: Normal mood and affect   RESULTS  Summary of this visit's results, reviewed and interpreted by myself:   EKG Interpretation  Date/Time:  Tuesday August 23 2020 21:01:25 EDT Ventricular Rate:  86 PR Interval:  154 QRS Duration: 138 QT Interval:  379 QTC Calculation: 224 R Axis:   -43 Text Interpretation: Sinus rhythm RBBB and LAFB LVH by voltage Inferior infarct, old Lateral leads are also involved No previous ECGs available Confirmed by Shanon Rosser [phone]) on 08/24/2020 12:35:06 AM       Laboratory Studies: Results for orders placed or performed during the hospital encounter of 08/23/20 (from the past 24 hour(s))  Comprehensive metabolic panel     Status: Abnormal   Collection Time: 08/23/20  9:10 PM  Result Value Ref Range   Sodium 136 135 - 145 mmol/L   Potassium 3.9 3.5 - 5.1 mmol/L   Chloride 105 98 - 111 mmol/L   CO2 24 22 - 32 mmol/L   Glucose, Bld 136 (H) 70 - 99 mg/dL   BUN 15 8 - 23 mg/dL   Creatinine, Ser 1.22 0.61 - 1.24 mg/dL   Calcium 9.3 8.9 - 10.3 mg/dL   Total Protein 7.5 6.5 - 8.1 g/dL   Albumin 4.3 3.5 - 5.0 g/dL   AST 22 15 - 41 U/L   ALT 32 0 - 44 U/L   Alkaline Phosphatase 56 38 - 126 U/L   Total Bilirubin 0.5 0.3 - 1.2 mg/dL   GFR, Estimated >60 >60 mL/min   Anion gap 7 5 - 15  CBC with Differential     Status: None   Collection Time: 08/23/20  9:10 PM  Result Value Ref Range   WBC 7.2 4.0 - 10.5 K/uL   RBC 4.89 4.22 - 5.81 MIL/uL   Hemoglobin 13.8 13.0 - 17.0 g/dL   HCT 43.1 39.0 - 52.0 %   MCV 88.1 80.0 - 100.0 fL   MCH 28.2 26.0 - 34.0 pg   MCHC 32.0 30.0 - 36.0 g/dL   RDW 13.2 11.5 - 15.5 %   Platelets 279 150 - 400 K/uL   nRBC 0.0 0.0 - 0.2 %   Neutrophils Relative % 61 %   Neutro Abs 4.4 1.7 - 7.7 K/uL   Lymphocytes Relative 28 %   Lymphs Abs 2.0 0.7 - 4.0 K/uL   Monocytes Relative 7 %   Monocytes Absolute 0.5 0.1 - 1.0 K/uL   Eosinophils Relative 3  %   Eosinophils Absolute 0.2 0.0 - 0.5 K/uL   Basophils Relative 1 %   Basophils Absolute 0.1 0.0 - 0.1 K/uL   Immature Granulocytes 0 %   Abs Immature Granulocytes 0.03 0.00 - 0.07 K/uL  Troponin I (High Sensitivity)     Status: Abnormal   Collection Time:  08/23/20  9:10 PM  Result Value Ref Range   Troponin I (High Sensitivity) 52 (H) <18 ng/L  Brain natriuretic peptide     Status: None   Collection Time: 08/23/20  9:10 PM  Result Value Ref Range   B Natriuretic Peptide 56.6 0.0 - 100.0 pg/mL  D-dimer, quantitative     Status: None   Collection Time: 08/23/20  9:10 PM  Result Value Ref Range   D-Dimer, Quant <0.27 0.00 - 0.50 ug/mL-FEU  Troponin I (High Sensitivity)     Status: Abnormal   Collection Time: 08/23/20 11:41 PM  Result Value Ref Range   Troponin I (High Sensitivity) 85 (H) <18 ng/L  Resp Panel by RT-PCR (Flu A&B, Covid) Nasopharyngeal Swab     Status: None   Collection Time: 08/24/20  1:49 AM   Specimen: Nasopharyngeal Swab; Nasopharyngeal(NP) swabs in vial transport medium  Result Value Ref Range   SARS Coronavirus 2 by RT PCR NEGATIVE NEGATIVE   Influenza A by PCR NEGATIVE NEGATIVE   Influenza B by PCR NEGATIVE NEGATIVE  CBC     Status: None   Collection Time: 08/24/20  5:00 AM  Result Value Ref Range   WBC 6.9 4.0 - 10.5 K/uL   RBC 4.76 4.22 - 5.81 MIL/uL   Hemoglobin 13.5 13.0 - 17.0 g/dL   HCT 42.3 39.0 - 52.0 %   MCV 88.9 80.0 - 100.0 fL   MCH 28.4 26.0 - 34.0 pg   MCHC 31.9 30.0 - 36.0 g/dL   RDW 13.2 11.5 - 15.5 %   Platelets 266 150 - 400 K/uL   nRBC 0.0 0.0 - 0.2 %   Imaging Studies: DG Chest 2 View  Result Date: 08/23/2020 CLINICAL DATA:  Chest pain EXAM: CHEST - 2 VIEW COMPARISON:  10/06/2012 FINDINGS: Lungs are well expanded, symmetric, and clear. No pneumothorax or pleural effusion. Cardiac size within normal limits. Pulmonary vascularity is normal. Osseous structures are age-appropriate. No acute bone abnormality. IMPRESSION: No active  cardiopulmonary disease. Electronically Signed   By: Fidela Salisbury MD   On: 08/23/2020 21:51    ED COURSE and MDM  Nursing notes, initial and subsequent vitals signs, including pulse oximetry, reviewed and interpreted by myself.  Vitals:   08/24/20 0315 08/24/20 0330 08/24/20 0345 08/24/20 0445  BP: (!) 149/80 (!) 144/76 (!) 152/78 (!) 158/76  Pulse: 66 70 71 66  Resp: 15 20 19 15   Temp:      TempSrc:      SpO2: 98% 96% 97% 98%  Weight:      Height:       Medications  nitroGLYCERIN (NITROSTAT) SL tablet 0.4 mg (has no administration in time range)  heparin ADULT infusion 100 units/mL (25000 units/238mL) (1,000 Units/hr Intravenous New Bag/Given 08/24/20 0207)  aspirin chewable tablet 324 mg (324 mg Oral Given 08/24/20 0202)  heparin bolus via infusion 4,000 Units (4,000 Units Intravenous Bolus from Bag 08/24/20 0207)    1:34 AM Patient's second troponin is elevated.  I believe the patient has been having unstable angina and would benefit from admission.  1:44 AM Discussed with Dr. Humphrey Rolls of cardiology.  He would like patient started on aspirin and heparin and the cardiology service will see him later this morning and he anticipates admission and catheterization.  PROCEDURES  Procedures CRITICAL CARE Performed by: Karen Chafe Dontavian Marchi Total critical care time: 30 minutes Critical care time was exclusive of separately billable procedures and treating other patients. Critical care was necessary to treat or  prevent imminent or life-threatening deterioration. Critical care was time spent personally by me on the following activities: development of treatment plan with patient and/or surrogate as well as nursing, discussions with consultants, evaluation of patient's response to treatment, examination of patient, obtaining history from patient or surrogate, ordering and performing treatments and interventions, ordering and review of laboratory studies, ordering and review of radiographic studies,  pulse oximetry and re-evaluation of patient's condition.   ED DIAGNOSES     ICD-10-CM   1. Unstable angina (HCC)  I20.0   2. Elevated troponin level  R77.8        Jenea Dake, Jenny Reichmann, MD 08/24/20 [id number]    Shanon Rosser, MD 09/06/20 1149

## 2020-08-24 NOTE — Progress Notes (Signed)
ANTICOAGULATION CONSULT NOTE  Pharmacy Consult for Heparin Indication: chest pain/ACS  No Known Allergies  Patient Measurements: Height: 5\' 9"  (175.3 cm) Weight: 78 kg (172 lb) IBW/kg (Calculated) : 70.7  Vital Signs: Temp: 97.8 F (36.6 C) (06/08 0738) Temp Source: Oral (06/08 0738) BP: 173/83 (06/08 0907) Pulse Rate: 69 (06/08 0907)  Labs: Recent Labs    08/23/20 2110 08/23/20 2341 08/24/20 0500 08/24/20 0800  HGB 13.8  --  13.5  --   HCT 43.1  --  42.3  --   PLT 279  --  266  --   HEPARINUNFRC  --   --   --  0.79*  CREATININE 1.22  --   --   --   TROPONINIHS 52* 85*  --   --     Estimated Creatinine Clearance: 57.1 mL/min (by C-G formula based on SCr of 1.22 mg/dL).   Medications:  Infusions:  . heparin 1,000 Units/hr (08/24/20 0207)    Assessment: 70 yo M with chest pain, elevated troponin. Pharmacy consulted to dose IV heparin. Planning for cardiac cath 6/8. Not on anticoagulation PTA  Today, 08/24/2020:  Hgb/Plt remain stable WNL  SCr WNL; no recent baseline for comparison  First heparin level slightly high on 1000 units/hr  No bleeding or infusion issues noted per RN; level drawn from opposite arm as heparin infusion  Goal of Therapy:  Heparin level 0.3-0.7 units/ml Monitor platelets by anticoagulation protocol: Yes   Plan:   Decrease heparin to 850 units/hr  Recheck 8 hr heparin level  Daily CBC and heparin level  Monitor for s/s bleeding or worsening thrombosis  Plan for cardiac cath today; f/u anticoagulation plans after cath (if any)  Vestal Markin A PharmD 08/24/2020,9:16 AM

## 2020-08-24 NOTE — Progress Notes (Signed)
ANTICOAGULATION CONSULT NOTE - Initial Consult  Pharmacy Consult for Heparin Indication: chest pain/ACS  No Known Allergies  Patient Measurements: Height: 5\' 9"  (175.3 cm) Weight: 78 kg (172 lb) IBW/kg (Calculated) : 70.7  Vital Signs: Temp: 98.4 F (36.9 C) (06/07 2045) Temp Source: Oral (06/07 2045) BP: 158/73 (06/08 0130) Pulse Rate: 68 (06/08 0130)  Labs: Recent Labs    08/23/20 2110 08/23/20 2341  HGB 13.8  --   HCT 43.1  --   PLT 279  --   CREATININE 1.22  --   TROPONINIHS 52* 85*    Estimated Creatinine Clearance: 57.1 mL/min (by C-G formula based on SCr of 1.22 mg/dL).   Medical History: Past Medical History:  Diagnosis Date  . BPH (benign prostatic hypertrophy)    URINARY FREQUENCY AND NOCTURIA  . GERD (gastroesophageal reflux disease)    NOTICES GERD WHEN SLEEPING - WAKES HIM UP IF HE EATS SPICY FOODS  . Hypertension   . NGB (new growth of bladder)    PT STATES HE WAS TOLD HE HAS GROWTH IN HIS BLADDER  . Nocturnal leg cramps    NOT MUCH OF A PROBLEM SINCE HIS DOCTOR CHANGED HIS MEDICATION  . Pain    SHARP PAIN IN RT GROIN AT TIMES - PT DOES NOT KNOW THE CAUSE OF THE PAIN  . Seasonal allergies     Medications:  Infusions:    Assessment: 70 yo M with chest pain, elevated troponin. Pharmacy consulted to dose IV heparin.  Not on anticoagulation PTA, CBC WNL  Goal of Therapy:  Heparin level 0.3-0.7 units/ml Monitor platelets by anticoagulation protocol: Yes   Plan:  Give 4000 units bolus x 1 Start heparin infusion at 1000 units/hr Check anti-Xa level in 6 hours and daily while on heparin Continue to monitor H&H and platelets  Netta Cedars PharmD 08/24/2020,1:48 AM

## 2020-08-24 NOTE — Progress Notes (Signed)
Pt not able to sleep well last night and "has a lot on his mind" tonight due to CVTS consult tmrw. Pt requests med for sleep. MD paged.

## 2020-08-24 NOTE — Progress Notes (Addendum)
IV Heparin gtt @ 850 units/hour dc'd prior to [facility], safety maintained, ok per Barbaraann Rondo, Librarian, academic

## 2020-08-24 NOTE — H&P (Addendum)
History and Physical    Tony Shaffer OEV:[id number] DOB: 1950/08/22 DOA: 08/23/2020  PCP: Sonia Side., FNP  Patient coming from: Home  Chief Complaint: Chest pain  HPI: Tony Shaffer is a 70 y.o. male with medical history significant of HTN, GERD, BPH. Presenting with chest pain. He's had one month of increased chest pain w/ activity. He noticed substernal chest pain coming about every other day initially. It would radiate throughout his chest. It was a sharp pain that lasted 5 - 10 minutes, If he rested, the pain would go away. Over the last month, the frequency of the pain increased to daily pain w/ activity. He now has that pain radiate into his left after. He spoke with his PCP, who recommended that he come to the ED for evaluation.   ED Course: He had mildly elevated trp. His EKG showed an RBBB. He was started on heparin gtt and cardiology was called. [facility] was called for admission.   Review of Systems: Review of systems is otherwise negative for all not mentioned in HPI.   PMHx Past Medical History:  Diagnosis Date  . BPH (benign prostatic hypertrophy)    URINARY FREQUENCY AND NOCTURIA  . GERD (gastroesophageal reflux disease)    NOTICES GERD WHEN SLEEPING - WAKES HIM UP IF HE EATS SPICY FOODS  . Hypertension   . NGB (new growth of bladder)    PT STATES HE WAS TOLD HE HAS GROWTH IN HIS BLADDER  . Nocturnal leg cramps    NOT MUCH OF A PROBLEM SINCE HIS DOCTOR CHANGED HIS MEDICATION  . Pain    SHARP PAIN IN RT GROIN AT TIMES - PT DOES NOT KNOW THE CAUSE OF THE PAIN  . Seasonal allergies     PSHx Past Surgical History:  Procedure Laterality Date  . PROSTATECTOMY N/A 10/17/2012   Procedure: TRANSVESICLE PROSTATECTOMY SUPRAPUBIC;  Surgeon: Ailene Rud, MD;  Location: [facility];  Service: Urology;  Laterality: N/A;  . WISDOM TOOTH EXTRACTION      SocHx  reports that he has never smoked. He has never used smokeless tobacco. He reports that he does not drink alcohol  and does not use drugs.  No Known Allergies  FamHx Family History  Problem Relation Age of Onset  . Heart disease Mother     Prior to Admission medications   Medication Sig Start Date End Date Taking? Authorizing Provider  aspirin EC 81 MG tablet Take 81 mg by mouth daily. Swallow whole.   Yes [provider]  fluticasone (FLONASE) 50 MCG/ACT nasal spray Place 2 sprays into both nostrils 2 (two) times daily.   Yes [provider]  ibuprofen (ADVIL,MOTRIN) 200 MG tablet Take 400 mg by mouth every 8 (eight) hours as needed for pain.   Yes [provider]  losartan (COZAAR) 25 MG tablet Take 25 mg by mouth daily. 10/13/19  Yes [provider]  pantoprazole (PROTONIX) 40 MG tablet Take 1 tablet by mouth 2 (two) times daily. 08/19/20  Yes [provider]  tiZANidine (ZANAFLEX) 4 MG tablet Take 4 mg by mouth every 8 (eight) hours as needed. 08/19/20  Yes [provider]  calcium-vitamin D (OSCAL WITH D) 500-200 MG-UNIT tablet Take 1 tablet by mouth.    [provider]    Physical Exam: Vitals:   08/24/20 0345 08/24/20 0445 08/24/20 0530 08/24/20 0738  BP: (!) 152/78 (!) 158/76 (!) 146/64 (!) 150/81  Pulse: 71 66 71 74  Resp: 19  15 15 14   Temp:    97.8 F (36.6 C)  TempSrc:    Oral  SpO2: 97% 98% 99% 99%  Weight:      Height:        General: 69 y.o. male resting in bed in NAD Eyes: PERRL, normal sclera ENMT: Nares patent w/o discharge, orophaynx clear, dentition normal, ears w/o discharge/lesions/ulcers Neck: Supple, trachea midline Cardiovascular: RRR, +S1, S2, no m/g/r, equal pulses throughout Respiratory: CTABL, no w/r/r, normal WOB GI: BS+, NDNT, no masses noted, no organomegaly noted MSK: No e/c/c Skin: No rashes, bruises, ulcerations noted Neuro: A&O x 3, no focal deficits Psyc: Appropriate interaction and affect, calm/cooperative  Labs on Admission: I have personally reviewed following labs and imaging  studies  CBC: Recent Labs  Lab 08/23/20 2110 08/24/20 0500  WBC 7.2 6.9  NEUTROABS 4.4  --   HGB 13.8 13.5  HCT 43.1 42.3  MCV 88.1 88.9  PLT 279 774   Basic Metabolic Panel: Recent Labs  Lab 08/23/20 2110  NA 136  K 3.9  CL 105  CO2 24  GLUCOSE 136*  BUN 15  CREATININE 1.22  CALCIUM 9.3   GFR: Estimated Creatinine Clearance: 57.1 mL/min (by C-G formula based on SCr of 1.22 mg/dL). Liver Function Tests: Recent Labs  Lab 08/23/20 2110  AST 22  ALT 32  ALKPHOS 56  BILITOT 0.5  PROT 7.5  ALBUMIN 4.3   No results for input(s): LIPASE, AMYLASE in the last 168 hours. No results for input(s): AMMONIA in the last 168 hours. Coagulation Profile: No results for input(s): INR, PROTIME in the last 168 hours. Cardiac Enzymes: No results for input(s): CKTOTAL, CKMB, CKMBINDEX, TROPONINI in the last 168 hours. BNP (last 3 results) No results for input(s): PROBNP in the last 8760 hours. HbA1C: No results for input(s): HGBA1C in the last 72 hours. CBG: No results for input(s): GLUCAP in the last 168 hours. Lipid Profile: No results for input(s): CHOL, HDL, LDLCALC, TRIG, CHOLHDL, LDLDIRECT in the last 72 hours. Thyroid Function Tests: No results for input(s): TSH, T4TOTAL, FREET4, T3FREE, THYROIDAB in the last 72 hours. Anemia Panel: No results for input(s): VITAMINB12, FOLATE, FERRITIN, TIBC, IRON, RETICCTPCT in the last 72 hours. Urine analysis: No results found for: COLORURINE, APPEARANCEUR, [place], [place], GLUCOSEU, HGBUR, BILIRUBINUR, KETONESUR, PROTEINUR, UROBILINOGEN, NITRITE, LEUKOCYTESUR  Radiological Exams on Admission: DG Chest 2 View  Result Date: 08/23/2020 CLINICAL DATA:  Chest pain EXAM: CHEST - 2 VIEW COMPARISON:  10/06/2012 FINDINGS: Lungs are well expanded, symmetric, and clear. No pneumothorax or pleural effusion. Cardiac size within normal limits. Pulmonary vascularity is normal. Osseous structures are age-appropriate. No acute bone abnormality.  IMPRESSION: No active cardiopulmonary disease. Electronically Signed   By: Fidela Salisbury MD   On: 08/23/2020 21:51    EKG: Independently reviewed. Sinus w/ RBBB  Assessment/Plan Chest pain Unstable angina     - admit to inpt, tele @ [facility]     - cardiology to take to cath this afternoon; appreciate their assistance     - continue heparin gtt for now     - check lipids     - continue ASA, start statin  HTN     - resume home meds when able     - will have PRN available  GERD     - protonix  DVT prophylaxis: heparin  Code Status: FULL  Family Communication: None at bedside  Consults called: Cardiology  Status is: Inpatient  Remains inpatient appropriate because:Inpatient level of  care appropriate due to severity of illness   Dispo: The patient is from: Home              Anticipated d/c is to: Home              Patient currently is not medically stable to d/c.   Difficult to place patient No  Time spent coordinating admission: 75 minutes  [facility]  If 7PM-7AM, please contact night-coverage [email or web address]  08/24/2020, 9:07 AM

## 2020-08-24 NOTE — Consult Note (Addendum)
Cardiology Consultation:   Patient ID: Tony Shaffer MRN: [id number]; DOB: 07/04/1950  Admit date: 08/23/2020 Date of Consult: 08/24/2020  PCP:  Sonia Side., [place] Prov[facility] Cardiologist:  New to [facility] - Dr. Johnsie Cancel     Patient Profile:   Tony Shaffer is a 70 y.o. male with a hx of right bundle branch block, hypertension, and BPH s/p prostatectomy in 2014 who is being seen 08/24/2020 for the evaluation of chest pain at the request of Dr. Florina Ou.  History of Present Illness:   Tony Shaffer is a pleasant 70 year old male with past medical history of right bundle branch block, hypertension and BPH s/p prostatectomy in 2014.  Patient was previously referred to Dr. Audie Box in September 2021 by his PCP for evaluation of an abnormal EKG.  He was noted to have right bundle branch block.  Blood pressure was elevated during the visit, he was instructed to follow-up with his PCP to further titrate blood pressure medication.  Echocardiogram was recommended given lack of symptoms to rule out structural heart disease.  Echocardiogram performed on 12/11/2019 showed EF 60 to 65%, grade 1 DD, no significant valve disease.  Since his visit, he started noticing substernal chest pressure radiating to bilateral chest in late fall last year.  The symptom only come on when he exerts himself.  When the winter came, he began to decrease activity level and the chest pain went away.  During the springtime, he decided to increase exercise level again.  The chest pain came back with activity.  Over the past few months, he has been noticing increasing intensity and frequency of the chest discomfort.  He denies any significant shortness of breath or dizziness.  Longest episode of chest pain was about 10 minutes.  He saw his PCP last Thursday who obtained some blood work.  Yesterday, while playing with his dog, he had a 2 episodes of chest pain relieved by rest.  He subsequently got a phone call from his  PCP notifying him of abnormal lab result with elevated D-dimer.  He was instructed to come to the emergency room.  He came to [facility] long ED, repeat D-dimer was actually normal however serial enzyme was mildly elevated.  Initial Hs troponin was 52, subsequent troponin went up to 85.  Blood pressure was elevated in the emergency room up to 354 systolic.  Systolic blood pressure came down to 150s without intervention.  Given elevated enzymes, he was placed on IV heparin.  Cardiology service was consulted for chest pain.   Past Medical History:  Diagnosis Date   BPH (benign prostatic hypertrophy)    URINARY FREQUENCY AND NOCTURIA   GERD (gastroesophageal reflux disease)    NOTICES GERD WHEN SLEEPING - WAKES HIM UP IF HE EATS SPICY FOODS   Hypertension    NGB (new growth of bladder)    PT STATES HE WAS TOLD HE HAS GROWTH IN HIS BLADDER   Nocturnal leg cramps    NOT MUCH OF A PROBLEM SINCE HIS DOCTOR CHANGED HIS MEDICATION   Pain    SHARP PAIN IN RT GROIN AT TIMES - PT DOES NOT KNOW THE CAUSE OF THE PAIN   Seasonal allergies     Past Surgical History:  Procedure Laterality Date   PROSTATECTOMY N/A 10/17/2012   Procedure: TRANSVESICLE PROSTATECTOMY SUPRAPUBIC;  Surgeon: Ailene Rud, MD;  Location: [facility];  Service: Urology;  Laterality: N/A;   WISDOM TOOTH EXTRACTION  Home Medications:  Prior to Admission medications   Medication Sig Start Date End Date Taking? Authorizing Provider  aspirin EC 81 MG tablet Take 81 mg by mouth daily. Swallow whole.   Yes [provider]  fluticasone (FLONASE) 50 MCG/ACT nasal spray Place 2 sprays into both nostrils 2 (two) times daily.   Yes [provider]  ibuprofen (ADVIL,MOTRIN) 200 MG tablet Take 400 mg by mouth every 8 (eight) hours as needed for pain.   Yes [provider]  losartan (COZAAR) 25 MG tablet Take 25 mg by mouth daily. 10/13/19  Yes [provider]  pantoprazole (PROTONIX) 40 MG tablet Take  1 tablet by mouth 2 (two) times daily. 08/19/20  Yes [provider]  tiZANidine (ZANAFLEX) 4 MG tablet Take 4 mg by mouth every 8 (eight) hours as needed. 08/19/20  Yes [provider]  calcium-vitamin D (OSCAL WITH D) 500-200 MG-UNIT tablet Take 1 tablet by mouth.    [provider]    Inpatient Medications: Scheduled Meds:   Continuous Infusions:  heparin 1,000 Units/hr (08/24/20 0207)   PRN Meds: nitroGLYCERIN  Allergies:   No Known Allergies  Social History:   Social History   Socioeconomic History   Marital status: Single    Spouse name: Not on file   Number of children: Not on file   Years of education: Not on file   Highest education level: Not on file  Occupational History   Occupation: retired  Tobacco Use   Smoking status: Never Smoker   Smokeless tobacco: Never Used  Substance and Sexual Activity   Alcohol use: No   Drug use: No   Sexual activity: Not on file  Other Topics Concern   Not on file  Social History Narrative   Not on file   Social Determinants of Health   Financial Resource Strain: Not on file  Food Insecurity: Not on file  Transportation Needs: Not on file  Physical Activity: Not on file  Stress: Not on file  Social Connections: Not on file  Intimate Partner Violence: Not on file    Family History:    Family History  Problem Relation Age of Onset   Heart disease Mother      ROS:  Please see the history of present illness.   All other ROS reviewed and negative.     Physical Exam/Data:   Vitals:   08/24/20 0345 08/24/20 0445 08/24/20 0530 08/24/20 0738  BP: (!) 152/78 (!) 158/76 (!) 146/64 (!) 150/81  Pulse: 71 66 71 74  Resp: 19 15 15 14   Temp:    97.8 F (36.6 C)  TempSrc:    Oral  SpO2: 97% 98% 99% 99%  Weight:      Height:       No intake or output data in the 24 hours ending 08/24/20 0821 Last 3 Weights 08/23/2020 11/26/2019 10/17/2012  Weight (lbs) 172 lb 173 lb 12.8 oz 175 lb  Weight (kg)  78.019 kg 78.835 kg 79.379 kg     Body mass index is 25.4 kg/m.  General:  Well nourished, well developed, in no acute distress HEENT: normal Lymph: no adenopathy Neck: no JVD Endocrine:  No thryomegaly Vascular: No carotid bruits; FA pulses 2+ bilaterally without bruits  Cardiac:  normal S1, S2; RRR; no murmur  Lungs:  clear to auscultation bilaterally, no wheezing, rhonchi or rales  Abd: soft, nontender, no hepatomegaly  Ext: no edema Musculoskeletal:  No deformities, BUE and BLE strength normal and  equal Skin: warm and dry  Neuro:  CNs 2-12 intact, no focal abnormalities noted Psych:  Normal affect   EKG:  The EKG was personally reviewed and demonstrates:  NSR with RBBB and LAFB Telemetry:  Telemetry was personally reviewed and demonstrates:  NSR, RBBB, no significant ventricular ectopy  Relevant CV Studies:  Echo 12/11/2019  1. Left ventricular ejection fraction, by estimation, is 60 to 65%. The  left ventricle has normal function. The left ventricle has no regional  wall motion abnormalities. There is mild left ventricular hypertrophy.  Left ventricular diastolic parameters  are consistent with Grade I diastolic dysfunction (impaired relaxation).   2. Right ventricular systolic function is normal. The right ventricular  size is normal. Tricuspid regurgitation signal is inadequate for assessing  PA pressure.   3. The mitral valve is normal in structure. No evidence of mitral valve  regurgitation. No evidence of mitral stenosis.   4. The aortic valve is tricuspid. Aortic valve regurgitation is not  visualized. Mild aortic valve sclerosis is present, with no evidence of  aortic valve stenosis.   5. The inferior vena cava is normal in size with greater than 50%  respiratory variability, suggesting right atrial pressure of 3 mmHg.   Laboratory Data:  High Sensitivity Troponin:   Recent Labs  Lab 08/23/20 2110 08/23/20 2341  TROPONINIHS 52* 85*     Chemistry Recent  Labs  Lab 08/23/20 2110  NA 136  K 3.9  CL 105  CO2 24  GLUCOSE 136*  BUN 15  CREATININE 1.22  CALCIUM 9.3  GFRNONAA >60  ANIONGAP 7    Recent Labs  Lab 08/23/20 2110  PROT 7.5  ALBUMIN 4.3  AST 22  ALT 32  ALKPHOS 56  BILITOT 0.5   Hematology Recent Labs  Lab 08/23/20 2110 08/24/20 0500  WBC 7.2 6.9  RBC 4.89 4.76  HGB 13.8 13.5  HCT 43.1 42.3  MCV 88.1 88.9  MCH 28.2 28.4  MCHC 32.0 31.9  RDW 13.2 13.2  PLT 279 266   BNP Recent Labs  Lab 08/23/20 2110  BNP 56.6    DDimer  Recent Labs  Lab 08/23/20 2110  DDIMER <0.27     Radiology/Studies:  DG Chest 2 View  Result Date: 08/23/2020 CLINICAL DATA:  Chest pain EXAM: CHEST - 2 VIEW COMPARISON:  10/06/2012 FINDINGS: Lungs are well expanded, symmetric, and clear. No pneumothorax or pleural effusion. Cardiac size within normal limits. Pulmonary vascularity is normal. Osseous structures are age-appropriate. No acute bone abnormality. IMPRESSION: No active cardiopulmonary disease. Electronically Signed   By: Fidela Salisbury MD   On: 08/23/2020 21:51     Assessment and Plan:   Class III angina:  - Hs trop mildly elevated 52 -->85, progressive angina only with exertion since last fall, improved during winter due to decreased exercise level, came back in spring after increased exercise level. Increasing in frequency and intensity.  - symptom concerning for progressive angina. Repeat echo. Plan for cardiac catheterization today.  - Risk and benefit of procedure explained to the patient who display clear understanding and agree to proceed.  Discussed with patient possible procedural risk include bleeding, vascular injury, renal injury, arrythmia, MI, stroke and loss of limb or life.  Uncontrolled hypertension: arrived with SBP 201, came down to 150s without treatment. On losartan 25mg  daily at home. Need to either uptitrate losartan or add amlodipine.   RBBB: careful with AV blocking agent, watch for more  advanced conduction abnormality   Risk Assessment/Risk  Scores:     TIMI Risk Score for Unstable Angina or Non-ST Elevation MI:   The patient's TIMI risk score is 4, which indicates a 20% risk of all cause mortality, new or recurrent myocardial infarction or need for urgent revascularization in the next 14 days.       For questions or updates, please contact [place] Please consult [email or web address] for contact info under    Signed, Almyra Deforest, [place]  08/24/2020 8:21 AM  Patient examined chart reviewed Discussed care with patient and PA Exam with poor dentition lungs clear no murmur abdomen soft no edema Palpable PT/DP bilaterally ECG with chronic RBBB minimal elevation in troponin Symptoms worrisome for angina. Favor diagnostic cath latter today Risks including stroke, contrast allergy MI bleeding and need for emergent CABG discussed Patient willing to proceed.   Jenkins Rouge MD [facility]

## 2020-08-24 NOTE — H&P (View-Only) (Signed)
Cardiology Consultation:   Patient ID: Tony Shaffer MRN: [id number]; DOB: [birth year]  Admit date: 08/23/2020 Date of Consult: 08/24/2020  PCP:  Sonia Side., [place] Providers Cardiologist:  New to [facility] - Dr. Johnsie Cancel     Patient Profile:   Tony Shaffer is a 70 y.o. male with a hx of right bundle branch block, hypertension, and BPH s/p prostatectomy in 2014 who is being seen 08/24/2020 for the evaluation of chest pain at the request of Dr. Florina Ou.  History of Present Illness:   Tony Shaffer is a pleasant 70 year old male with past medical history of right bundle branch block, hypertension and BPH s/p prostatectomy in 2014.  Patient was previously referred to Dr. Audie Box in September 2021 by his PCP for evaluation of an abnormal EKG.  He was noted to have right bundle branch block.  Blood pressure was elevated during the visit, he was instructed to follow-up with his PCP to further titrate blood pressure medication.  Echocardiogram was recommended given lack of symptoms to rule out structural heart disease.  Echocardiogram performed on 12/11/2019 showed EF 60 to 65%, grade 1 DD, no significant valve disease.  Since his visit, he started noticing substernal chest pressure radiating to bilateral chest in late fall last year.  The symptom only come on when he exerts himself.  When the winter came, he began to decrease activity level and the chest pain went away.  During the springtime, he decided to increase exercise level again.  The chest pain came back with activity.  Over the past few months, he has been noticing increasing intensity and frequency of the chest discomfort.  He denies any significant shortness of breath or dizziness.  Longest episode of chest pain was about 10 minutes.  He saw his PCP last Thursday who obtained some blood work.  Yesterday, while playing with his dog, he had a 2 episodes of chest pain relieved by rest.  He subsequently got a phone call from his  PCP notifying him of abnormal lab result with elevated D-dimer.  He was instructed to come to the emergency room.  He came to [facility] long ED, repeat D-dimer was actually normal however serial enzyme was mildly elevated.  Initial Hs troponin was 52, subsequent troponin went up to 85.  Blood pressure was elevated in the emergency room up to 782 systolic.  Systolic blood pressure came down to 150s without intervention.  Given elevated enzymes, he was placed on IV heparin.  Cardiology service was consulted for chest pain.   Past Medical History:  Diagnosis Date   BPH (benign prostatic hypertrophy)    URINARY FREQUENCY AND NOCTURIA   GERD (gastroesophageal reflux disease)    NOTICES GERD WHEN SLEEPING - WAKES HIM UP IF HE EATS SPICY FOODS   Hypertension    NGB (new growth of bladder)    PT STATES HE WAS TOLD HE HAS GROWTH IN HIS BLADDER   Nocturnal leg cramps    NOT MUCH OF A PROBLEM SINCE HIS DOCTOR CHANGED HIS MEDICATION   Pain    SHARP PAIN IN RT GROIN AT TIMES - PT DOES NOT KNOW THE CAUSE OF THE PAIN   Seasonal allergies     Past Surgical History:  Procedure Laterality Date   PROSTATECTOMY N/A 10/17/2012   Procedure: TRANSVESICLE PROSTATECTOMY SUPRAPUBIC;  Surgeon: Ailene Rud, MD;  Location: [facility];  Service: Urology;  Laterality: N/A;   WISDOM TOOTH EXTRACTION  Home Medications:  Prior to Admission medications   Medication Sig Start Date End Date Taking? Authorizing Provider  aspirin EC 81 MG tablet Take 81 mg by mouth daily. Swallow whole.   Yes [provider]  fluticasone (FLONASE) 50 MCG/ACT nasal spray Place 2 sprays into both nostrils 2 (two) times daily.   Yes [provider]  ibuprofen (ADVIL,MOTRIN) 200 MG tablet Take 400 mg by mouth every 8 (eight) hours as needed for pain.   Yes [provider]  losartan (COZAAR) 25 MG tablet Take 25 mg by mouth daily. 10/13/19  Yes [provider]  pantoprazole (PROTONIX) 40 MG tablet Take  1 tablet by mouth 2 (two) times daily. 08/19/20  Yes [provider]  tiZANidine (ZANAFLEX) 4 MG tablet Take 4 mg by mouth every 8 (eight) hours as needed. 08/19/20  Yes [provider]  calcium-vitamin D (OSCAL WITH D) 500-200 MG-UNIT tablet Take 1 tablet by mouth.    [provider]    Inpatient Medications: Scheduled Meds:   Continuous Infusions:  heparin 1,000 Units/hr (08/24/20 0207)   PRN Meds: nitroGLYCERIN  Allergies:   No Known Allergies  Social History:   Social History   Socioeconomic History   Marital status: Single    Spouse name: Not on file   Number of children: Not on file   Years of education: Not on file   Highest education level: Not on file  Occupational History   Occupation: retired  Tobacco Use   Smoking status: Never Smoker   Smokeless tobacco: Never Used  Substance and Sexual Activity   Alcohol use: No   Drug use: No   Sexual activity: Not on file  Other Topics Concern   Not on file  Social History Narrative   Not on file   Social Determinants of Health   Financial Resource Strain: Not on file  Food Insecurity: Not on file  Transportation Needs: Not on file  Physical Activity: Not on file  Stress: Not on file  Social Connections: Not on file  Intimate Partner Violence: Not on file    Family History:    Family History  Problem Relation Age of Onset   Heart disease Mother      ROS:  Please see the history of present illness.   All other ROS reviewed and negative.     Physical Exam/Data:   Vitals:   08/24/20 0345 08/24/20 0445 08/24/20 0530 08/24/20 0738  BP: (!) 152/78 (!) 158/76 (!) 146/64 (!) 150/81  Pulse: 71 66 71 74  Resp: 19 15 15 14   Temp:    97.8 F (36.6 C)  TempSrc:    Oral  SpO2: 97% 98% 99% 99%  Weight:      Height:       No intake or output data in the 24 hours ending 08/24/20 0821 Last 3 Weights 08/23/2020 11/26/2019 10/17/2012  Weight (lbs) 172 lb 173 lb 12.8 oz 175 lb  Weight (kg)  78.019 kg 78.835 kg 79.379 kg     Body mass index is 25.4 kg/m.  General:  Well nourished, well developed, in no acute distress HEENT: normal Lymph: no adenopathy Neck: no JVD Endocrine:  No thryomegaly Vascular: No carotid bruits; FA pulses 2+ bilaterally without bruits  Cardiac:  normal S1, S2; RRR; no murmur  Lungs:  clear to auscultation bilaterally, no wheezing, rhonchi or rales  Abd: soft, nontender, no hepatomegaly  Ext: no edema Musculoskeletal:  No deformities, BUE and BLE strength normal and  equal Skin: warm and dry  Neuro:  CNs 2-12 intact, no focal abnormalities noted Psych:  Normal affect   EKG:  The EKG was personally reviewed and demonstrates:  NSR with RBBB and LAFB Telemetry:  Telemetry was personally reviewed and demonstrates:  NSR, RBBB, no significant ventricular ectopy  Relevant CV Studies:  Echo 12/11/2019  1. Left ventricular ejection fraction, by estimation, is 60 to 65%. The  left ventricle has normal function. The left ventricle has no regional  wall motion abnormalities. There is mild left ventricular hypertrophy.  Left ventricular diastolic parameters  are consistent with Grade I diastolic dysfunction (impaired relaxation).   2. Right ventricular systolic function is normal. The right ventricular  size is normal. Tricuspid regurgitation signal is inadequate for assessing  PA pressure.   3. The mitral valve is normal in structure. No evidence of mitral valve  regurgitation. No evidence of mitral stenosis.   4. The aortic valve is tricuspid. Aortic valve regurgitation is not  visualized. Mild aortic valve sclerosis is present, with no evidence of  aortic valve stenosis.   5. The inferior vena cava is normal in size with greater than 50%  respiratory variability, suggesting right atrial pressure of 3 mmHg.   Laboratory Data:  High Sensitivity Troponin:   Recent Labs  Lab 08/23/20 2110 08/23/20 2341  TROPONINIHS 52* 85*     Chemistry Recent  Labs  Lab 08/23/20 2110  NA 136  K 3.9  CL 105  CO2 24  GLUCOSE 136*  BUN 15  CREATININE 1.22  CALCIUM 9.3  GFRNONAA >60  ANIONGAP 7    Recent Labs  Lab 08/23/20 2110  PROT 7.5  ALBUMIN 4.3  AST 22  ALT 32  ALKPHOS 56  BILITOT 0.5   Hematology Recent Labs  Lab 08/23/20 2110 08/24/20 0500  WBC 7.2 6.9  RBC 4.89 4.76  HGB 13.8 13.5  HCT 43.1 42.3  MCV 88.1 88.9  MCH 28.2 28.4  MCHC 32.0 31.9  RDW 13.2 13.2  PLT 279 266   BNP Recent Labs  Lab 08/23/20 2110  BNP 56.6    DDimer  Recent Labs  Lab 08/23/20 2110  DDIMER <0.27     Radiology/Studies:  DG Chest 2 View  Result Date: 08/23/2020 CLINICAL DATA:  Chest pain EXAM: CHEST - 2 VIEW COMPARISON:  10/06/2012 FINDINGS: Lungs are well expanded, symmetric, and clear. No pneumothorax or pleural effusion. Cardiac size within normal limits. Pulmonary vascularity is normal. Osseous structures are age-appropriate. No acute bone abnormality. IMPRESSION: No active cardiopulmonary disease. Electronically Signed   By: Fidela Salisbury MD   On: 08/23/2020 21:51     Assessment and Plan:   Class III angina:  - Hs trop mildly elevated 52 -->85, progressive angina only with exertion since last fall, improved during winter due to decreased exercise level, came back in spring after increased exercise level. Increasing in frequency and intensity.  - symptom concerning for progressive angina. Repeat echo. Plan for cardiac catheterization today.  - Risk and benefit of procedure explained to the patient who display clear understanding and agree to proceed.  Discussed with patient possible procedural risk include bleeding, vascular injury, renal injury, arrythmia, MI, stroke and loss of limb or life.  Uncontrolled hypertension: arrived with SBP 201, came down to 150s without treatment. On losartan 25mg  daily at home. Need to either uptitrate losartan or add amlodipine.   RBBB: careful with AV blocking agent, watch for more  advanced conduction abnormality   Risk Assessment/Risk  Scores:     TIMI Risk Score for Unstable Angina or Non-ST Elevation MI:   The patient's TIMI risk score is 4, which indicates a 20% risk of all cause mortality, new or recurrent myocardial infarction or need for urgent revascularization in the next 14 days.       For questions or updates, please contact [place] Please consult [email or web address] for contact info under    Signed, Almyra Deforest, [place]  08/24/2020 8:21 AM  Patient examined chart reviewed Discussed care with patient and PA Exam with poor dentition lungs clear no murmur abdomen soft no edema Palpable PT/DP bilaterally ECG with chronic RBBB minimal elevation in troponin Symptoms worrisome for angina. Favor diagnostic cath latter today Risks including stroke, contrast allergy MI bleeding and need for emergent CABG discussed Patient willing to proceed.   Jenkins Rouge MD [facility]

## 2020-08-24 NOTE — Interval H&P Note (Signed)
History and Physical Interval Note:  08/24/2020 4:08 PM  Tony Shaffer  has presented today for surgery, with the diagnosis of unstable angina.  The various methods of treatment have been discussed with the patient and family. After consideration of risks, benefits and other options for treatment, the patient has consented to  Procedure(s): LEFT HEART CATH AND CORONARY ANGIOGRAPHY (N/A) as a surgical intervention.  The patient's history has been reviewed, patient examined, no change in status, stable for surgery.  I have reviewed the patient's chart and labs.  Questions were answered to the patient's satisfaction.    Cath Lab Visit (complete for each Cath Lab visit)  Clinical Evaluation Leading to the Procedure:   ACS: Yes.    Non-ACS:    Anginal Classification: CCS III  Anti-ischemic medical therapy: Minimal Therapy (1 class of medications)  Non-Invasive Test Results: No non-invasive testing performed  Prior CABG: No previous CABG        Lauree Chandler

## 2020-08-25 ENCOUNTER — Encounter (HOSPITAL_COMMUNITY): Payer: Self-pay | Admitting: Cardiovascular Disease

## 2020-08-25 ENCOUNTER — Inpatient Hospital Stay (HOSPITAL_COMMUNITY): Payer: Medicare HMO

## 2020-08-25 DIAGNOSIS — I2510 Atherosclerotic heart disease of native coronary artery without angina pectoris: Secondary | ICD-10-CM

## 2020-08-25 DIAGNOSIS — Z01810 Encounter for preprocedural cardiovascular examination: Secondary | ICD-10-CM

## 2020-08-25 DIAGNOSIS — Z0000 Encounter for general adult medical examination without abnormal findings: Secondary | ICD-10-CM

## 2020-08-25 DIAGNOSIS — I214 Non-ST elevation (NSTEMI) myocardial infarction: Principal | ICD-10-CM

## 2020-08-25 LAB — ECHOCARDIOGRAM COMPLETE
AR max vel: 2.61 cm2
AV Area VTI: 2.78 cm2
AV Area mean vel: 2.55 cm2
AV Mean grad: 2 mmHg
AV Peak grad: 4.4 mmHg
Ao pk vel: 1.05 m/s
Area-P 1/2: 2.36 cm2
Height: 69 in
S' Lateral: 2.9 cm
Weight: 2668.8 oz

## 2020-08-25 LAB — PROTIME-INR
INR: 1 (ref 0.8–1.2)
Prothrombin Time: 12.7 seconds (ref 11.4–15.2)

## 2020-08-25 LAB — URINALYSIS, ROUTINE W REFLEX MICROSCOPIC
Bilirubin Urine: NEGATIVE
Glucose, UA: NEGATIVE mg/dL
Hgb urine dipstick: NEGATIVE
Ketones, ur: NEGATIVE mg/dL
Leukocytes,Ua: NEGATIVE
Nitrite: NEGATIVE
Protein, ur: NEGATIVE mg/dL
Specific Gravity, Urine: 1.011 (ref 1.005–1.030)
pH: 6 (ref 5.0–8.0)

## 2020-08-25 LAB — BASIC METABOLIC PANEL
Anion gap: 10 (ref 5–15)
BUN: 13 mg/dL (ref 8–23)
CO2: 25 mmol/L (ref 22–32)
Calcium: 9 mg/dL (ref 8.9–10.3)
Chloride: 100 mmol/L (ref 98–111)
Creatinine, Ser: 1.11 mg/dL (ref 0.61–1.24)
GFR, Estimated: >60 mL/min (ref >60)
Glucose, Bld: 139 mg/dL — ABNORMAL HIGH (ref 70–99)
Potassium: 4 mmol/L (ref 3.5–5.1)
Sodium: 135 mmol/L (ref 135–145)

## 2020-08-25 LAB — TYPE AND SCREEN
ABO/RH(D): AB POS
Antibody Screen: NEGATIVE

## 2020-08-25 LAB — HIV ANTIBODY (ROUTINE TESTING W REFLEX): HIV Screen 4th Generation wRfx: NONREACTIVE

## 2020-08-25 LAB — CBC
HCT: 45.1 % (ref 39.0–52.0)
Hemoglobin: 14.4 g/dL (ref 13.0–17.0)
MCH: 28.2 pg (ref 26.0–34.0)
MCHC: 31.9 g/dL (ref 30.0–36.0)
MCV: 88.3 fL (ref 80.0–100.0)
Platelets: 283 10*3/uL (ref 150–400)
RBC: 5.11 MIL/uL (ref 4.22–5.81)
RDW: 13.2 % (ref 11.5–15.5)
WBC: 6.9 10*3/uL (ref 4.0–10.5)
nRBC: 0 % (ref 0.0–0.2)

## 2020-08-25 LAB — BLOOD GAS, ARTERIAL
Acid-base deficit: 3.4 mmol/L — ABNORMAL HIGH (ref 0.0–2.0)
Bicarbonate: 20.5 mmol/L (ref 20.0–28.0)
FIO2: 21
O2 Saturation: 96.6 %
Patient temperature: 36.9
pCO2 arterial: 33.1 mmHg (ref 32.0–48.0)
pH, Arterial: 7.408 (ref 7.350–7.450)
pO2, Arterial: 84.8 mmHg (ref 83.0–108.0)

## 2020-08-25 LAB — SURGICAL PCR SCREEN
MRSA, PCR: NEGATIVE
Staphylococcus aureus: NEGATIVE

## 2020-08-25 LAB — HEPARIN LEVEL (UNFRACTIONATED)
Heparin Unfractionated: 0.16 IU/mL — ABNORMAL LOW (ref 0.30–0.70)
Heparin Unfractionated: 0.12 IU/mL — ABNORMAL LOW (ref 0.30–0.70)

## 2020-08-25 LAB — APTT: aPTT: 46 seconds — ABNORMAL HIGH (ref 24–36)

## 2020-08-25 MED ORDER — CHLORHEXIDINE GLUCONATE CLOTH 2 % EX PADS
6.0000 | MEDICATED_PAD | Freq: Once | CUTANEOUS | Status: AC
  Administered 2020-08-25: 6 via TOPICAL

## 2020-08-25 MED ORDER — PHENYLEPHRINE HCL-NACL 20-0.9 MG/250ML-% IV SOLN
30.0000 ug/min | INTRAVENOUS | Status: AC
  Administered 2020-08-26: 25 ug/min via INTRAVENOUS
  Filled 2020-08-25: qty 250

## 2020-08-25 MED ORDER — POTASSIUM CHLORIDE 2 MEQ/ML IV SOLN
80.0000 meq | INTRAVENOUS | Status: DC
  Filled 2020-08-25: qty 40

## 2020-08-25 MED ORDER — PLASMA-LYTE A IV SOLN
INTRAVENOUS | Status: DC
  Filled 2020-08-25: qty 5

## 2020-08-25 MED ORDER — DEXMEDETOMIDINE HCL IN NACL 400 MCG/100ML IV SOLN
0.1000 ug/kg/h | INTRAVENOUS | Status: AC
Start: 2020-08-26 — End: 2020-08-26
  Administered 2020-08-26: .4 ug/kg/h via INTRAVENOUS
  Filled 2020-08-25: qty 100

## 2020-08-25 MED ORDER — CEFAZOLIN SODIUM-DEXTROSE 2-4 GM/100ML-% IV SOLN
2.0000 g | INTRAVENOUS | Status: DC
  Filled 2020-08-25: qty 100

## 2020-08-25 MED ORDER — MILRINONE LACTATE IN DEXTROSE 20-5 MG/100ML-% IV SOLN
0.3000 ug/kg/min | INTRAVENOUS | Status: DC
  Filled 2020-08-25: qty 100

## 2020-08-25 MED ORDER — EPINEPHRINE HCL 5 MG/250ML IV SOLN IN NS
0.0000 ug/min | INTRAVENOUS | Status: DC
Start: 2020-08-26 — End: 2020-08-26
  Filled 2020-08-25: qty 250

## 2020-08-25 MED ORDER — TRANEXAMIC ACID (OHS) PUMP PRIME SOLUTION
2.0000 mg/kg | INTRAVENOUS | Status: DC
  Filled 2020-08-25: qty 1.51

## 2020-08-25 MED ORDER — VANCOMYCIN HCL 1250 MG/250ML IV SOLN
1250.0000 mg | INTRAVENOUS | Status: AC
  Administered 2020-08-26: 1250 mg via INTRAVENOUS
  Filled 2020-08-25: qty 250

## 2020-08-25 MED ORDER — NITROGLYCERIN IN D5W 200-5 MCG/ML-% IV SOLN
2.0000 ug/min | INTRAVENOUS | Status: AC
  Administered 2020-08-26: 5 ug/min via INTRAVENOUS
  Filled 2020-08-25: qty 250

## 2020-08-25 MED ORDER — TEMAZEPAM 15 MG PO CAPS
15.0000 mg | ORAL_CAPSULE | Freq: Once | ORAL | Status: AC | PRN
  Administered 2020-08-26: 15 mg via ORAL
  Filled 2020-08-25: qty 1

## 2020-08-25 MED ORDER — PERFLUTREN LIPID MICROSPHERE
1.0000 mL | INTRAVENOUS | Status: AC | PRN
  Administered 2020-08-25: 2 mL via INTRAVENOUS
  Filled 2020-08-25: qty 10

## 2020-08-25 MED ORDER — CEFAZOLIN SODIUM-DEXTROSE 2-4 GM/100ML-% IV SOLN
2.0000 g | INTRAVENOUS | Status: AC
  Administered 2020-08-26 (×2): 2 g via INTRAVENOUS
  Filled 2020-08-25: qty 100

## 2020-08-25 MED ORDER — MAGNESIUM SULFATE 50 % IJ SOLN
40.0000 meq | INTRAMUSCULAR | Status: DC
  Filled 2020-08-25: qty 9.85

## 2020-08-25 MED ORDER — CHLORHEXIDINE GLUCONATE CLOTH 2 % EX PADS
6.0000 | MEDICATED_PAD | Freq: Once | CUTANEOUS | Status: AC
Start: 2020-08-26 — End: 2020-08-26
  Administered 2020-08-26: 6 via TOPICAL

## 2020-08-25 MED ORDER — BISACODYL 5 MG PO TBEC: 5.0000 mg | DELAYED_RELEASE_TABLET | Freq: Once | ORAL | Status: DC

## 2020-08-25 MED ORDER — TRANEXAMIC ACID (OHS) BOLUS VIA INFUSION
15.0000 mg/kg | INTRAVENOUS | Status: AC
  Administered 2020-08-26: 1135.5 mg via INTRAVENOUS
  Filled 2020-08-25: qty 1136

## 2020-08-25 MED ORDER — INSULIN REGULAR(HUMAN) IN NACL 100-0.9 UT/100ML-% IV SOLN
INTRAVENOUS | Status: DC
  Filled 2020-08-25: qty 100

## 2020-08-25 MED ORDER — CHLORHEXIDINE GLUCONATE 0.12 % MT SOLN
15.0000 mL | Freq: Once | OROMUCOSAL | Status: AC
Start: 2020-08-26 — End: 2020-08-26
  Administered 2020-08-26: 15 mL via OROMUCOSAL
  Filled 2020-08-25: qty 15

## 2020-08-25 MED ORDER — SODIUM CHLORIDE 0.9 % IV SOLN
INTRAVENOUS | Status: DC
  Filled 2020-08-25: qty 30

## 2020-08-25 MED ORDER — NOREPINEPHRINE 4 MG/250ML-% IV SOLN
0.0000 ug/min | INTRAVENOUS | Status: DC
Start: 2020-08-26 — End: 2020-08-26
  Filled 2020-08-25: qty 250

## 2020-08-25 MED ORDER — METOPROLOL TARTRATE 12.5 MG HALF TABLET
12.5000 mg | ORAL_TABLET | Freq: Once | ORAL | Status: AC
  Administered 2020-08-26: 12.5 mg via ORAL
  Filled 2020-08-25: qty 1

## 2020-08-25 MED ORDER — TRANEXAMIC ACID 1000 MG/10ML IV SOLN
1.5000 mg/kg/h | INTRAVENOUS | Status: AC
  Administered 2020-08-26: 1.5 mg/kg/h via INTRAVENOUS
  Filled 2020-08-25: qty 25

## 2020-08-25 MED FILL — Verapamil HCl IV Soln 2.5 MG/ML: INTRAVENOUS | Qty: 2 | Status: AC

## 2020-08-25 NOTE — Progress Notes (Signed)
Phlebotomy called this RN. Phlebotomy states pt has an 8am Heparin level due. Phlebotomy to draw pt's BMP and CBC with the Heparin level at 8am.

## 2020-08-25 NOTE — Progress Notes (Signed)
Pre-CABG Dopplers completed. Refer to "CV Proc" under chart review to view preliminary results.  08/25/2020 2:17 PM Kelby Aline., MHA, RVT, RDCS, RDMS

## 2020-08-25 NOTE — Consult Note (Addendum)
[address]       [place] [address]             [phone]        Tony Shaffer [facility] Medical Record #[id number] Date of Birth: [birth year]  Referring: No ref. provider found Primary Care: Sonia Side., FNP Primary Cardiologist:None  Chief Complaint:    Chief Complaint  Patient presents with   Chest Pain    History of Present Illness:      Mr. Tony Shaffer is a 70 year old male patient with past medical history significant for right bundle branch block, BPH status post prostatectomy in 2014, and hypertension who was previously referred to Dr. Audie Box in September of last year for evaluation of abnormal EKG.  He was noted to have a right bundle branch block at this time and his blood pressure was elevated during the visit.  Echocardiogram was recommended to rule out structural heart disease.  Echocardiogram was performed on 12/11/2019 which showed an EF of 60 to 65% and no significant valvular disease.  Since his visit he had had some substernal chest pressure radiating across his chest since fall of last year.  This past spring he increased his exercise level and the chest pain came back with activity.  Over the past few months he has noticed increasing intensity of the chest pain and frequency.  He reached out to his primary care provider last week who obtained some blood work.  On 08/23/2020 he was outside playing with his dog and experienced 2 episodes of chest pain relieved by rest.  His lab results showed elevated D-dimer and he was instructed to go to the emergency room.  During his time in the [facility] long ED they did a repeat D-dimer which was normal however serial enzymes were mildly elevated.  His initial troponin was 52 and subsequently went up to 85.  His blood pressure was 209 systolic.  He was placed on IV heparin and cardiology was consulted.  He underwent cardiac catheterization on 08/24/2020 which showed ostial to proximal circumflex stenosis of  40%, distal circumflex lesion of 40%, ostial to mid left main lesion of 50%, ostial to proximal LAD lesion of 99%, and a left ventricular ejection fraction of 65%.  Due to his severe LAD disease cardiology recommended revascularization via coronary artery bypass grafting.  We are consulted for possible surgical revascularization.  He does stay pretty active at home and was running/walking more frequently before his symptoms progressed. He does have a family history of heart disease in his mother and sister.   Current Activity/ Functional Status: Patient was independent with mobility/ambulation, transfers, ADL's, IADL's.   Zubrod Score: At the time of surgery this patient's most appropriate activity status/level should be described as: []     0    Normal activity, no symptoms [x]     1    Restricted in physical strenuous activity but ambulatory, able to do out light work []     2    Ambulatory and capable of self care, unable to do work activities, up and about                 more than 50%  Of the time                            []     3    Only limited self care, in bed greater than  50% of waking hours []     4    Completely disabled, no self care, confined to bed or chair []     5    Moribund  Past Medical History:  Diagnosis Date   BPH (benign prostatic hypertrophy)    URINARY FREQUENCY AND NOCTURIA   GERD (gastroesophageal reflux disease)    NOTICES GERD WHEN SLEEPING - WAKES HIM UP IF HE EATS SPICY FOODS   Hypertension    NGB (new growth of bladder)    PT STATES HE WAS TOLD HE HAS GROWTH IN HIS BLADDER   Nocturnal leg cramps    NOT MUCH OF A PROBLEM SINCE HIS DOCTOR CHANGED HIS MEDICATION   Pain    SHARP PAIN IN RT GROIN AT TIMES - PT DOES NOT KNOW THE CAUSE OF THE PAIN   Seasonal allergies     Past Surgical History:  Procedure Laterality Date   LEFT HEART CATH AND CORONARY ANGIOGRAPHY N/A 08/24/2020   Procedure: LEFT HEART CATH AND CORONARY ANGIOGRAPHY;  Surgeon: Burnell Blanks, MD;  Location: [facility];  Service: Cardiovascular;  Laterality: N/A;   PROSTATECTOMY N/A 10/17/2012   Procedure: TRANSVESICLE PROSTATECTOMY SUPRAPUBIC;  Surgeon: Ailene Rud, MD;  Location: [facility];  Service: Urology;  Laterality: N/A;   WISDOM TOOTH EXTRACTION      Social History   Tobacco Use  Smoking Status Never  Smokeless Tobacco Never    Social History   Substance and Sexual Activity  Alcohol Use No     No Known Allergies  Current Facility-Administered Medications  Medication Dose Route Frequency Provider Last Rate Last Admin   0.9 %  sodium chloride infusion  250 mL Intravenous PRN Burnell Blanks, MD       acetaminophen (TYLENOL) tablet 650 mg  650 mg Oral Q4H PRN Marylyn Ishihara, Tyrone A, DO   650 mg at 08/24/20 2114   aspirin EC tablet 81 mg  81 mg Oral Daily Kyle, Tyrone A, DO       atorvastatin (LIPITOR) tablet 40 mg  40 mg Oral Daily Kyle, Tyrone A, DO   40 mg at 08/24/20 2114   fluticasone (FLONASE) 50 MCG/ACT nasal spray 2 spray  2 spray Each Nare BID Marylyn Ishihara, Tyrone A, DO   2 spray at 08/24/20 2139   heparin ADULT infusion 100 units/mL (25000 units/246mL)  850 Units/hr Intravenous Continuous Carney, Gay Filler, RPH 8.5 mL/hr at 08/25/20 0740 850 Units/hr at 08/25/20 0740   losartan (COZAAR) tablet 25 mg  25 mg Oral Daily Kyle, Tyrone A, DO   25 mg at 08/24/20 1036   melatonin tablet 3 mg  3 mg Oral QHS PRN Dion Body, MD   3 mg at 08/24/20 2138   nitroGLYCERIN (NITROSTAT) SL tablet 0.4 mg  0.4 mg Sublingual Q5 min PRN Marylyn Ishihara, Tyrone A, DO       ondansetron (ZOFRAN) injection 4 mg  4 mg Intravenous Q6H PRN Marylyn Ishihara, Tyrone A, DO       pantoprazole (PROTONIX) EC tablet 40 mg  40 mg Oral BID Marylyn Ishihara, Tyrone A, DO   40 mg at 08/24/20 2114   sodium chloride flush (NS) 0.9 % injection 3 mL  3 mL Intravenous Q12H Lauree Chandler D, MD       sodium chloride flush (NS) 0.9 % injection 3 mL  3 mL Intravenous PRN Burnell Blanks, MD        tiZANidine (ZANAFLEX) tablet 4 mg  4 mg Oral Q8H PRN Marylyn Ishihara,  Tyrone A, DO        Medications Prior to Admission  Medication Sig Dispense Refill Last Dose   aspirin EC 81 MG tablet Take 81 mg by mouth daily. Swallow whole.   08/23/2020 at 430am   fluticasone (FLONASE) 50 MCG/ACT nasal spray Place 2 sprays into both nostrils 2 (two) times daily.   08/23/2020 at 430am   ibuprofen (ADVIL,MOTRIN) 200 MG tablet Take 400 mg by mouth every 8 (eight) hours as needed for pain.      losartan (COZAAR) 25 MG tablet Take 25 mg by mouth daily.   08/22/2020 at Unknown time   pantoprazole (PROTONIX) 40 MG tablet Take 1 tablet by mouth 2 (two) times daily.   08/23/2020 at 430am   tiZANidine (ZANAFLEX) 4 MG tablet Take 4 mg by mouth every 8 (eight) hours as needed.   08/23/2020 at 430am   calcium-vitamin D (OSCAL WITH D) 500-200 MG-UNIT tablet Take 1 tablet by mouth.       Family History  Problem Relation Age of Onset   Heart disease Mother      Review of Systems:   Review of Systems  Constitutional:  Positive for malaise/fatigue.  Respiratory:  Negative for shortness of breath.   Cardiovascular:  Positive for chest pain. Negative for leg swelling.  Gastrointestinal: Negative.   Musculoskeletal:  Positive for back pain (sharp right lower back pain) and myalgias.  Pertinent items are noted in HPI.      Physical Exam: BP (!) 154/79 (BP Location: Right Arm)   Pulse 67   Temp 98.5 F (36.9 C) (Oral)   Resp 19   Ht 5\' 9"  (1.753 m)   Wt 75.7 kg   SpO2 100%   BMI 24.63 kg/m    General appearance: alert, cooperative, and no distress Resp: clear to auscultation bilaterally Cardio: regular rate and rhythm, S1, S2 normal, no murmur, click, rub or gallop GI: non-tender Extremities: extremities normal, atraumatic, no cyanosis or edema Neurologic: Grossly normal  Diagnostic Studies & Laboratory data:     Recent Radiology Findings:   DG Chest 2 View  Result Date: 08/23/2020 CLINICAL DATA:  Chest pain  EXAM: CHEST - 2 VIEW COMPARISON:  10/06/2012 FINDINGS: Lungs are well expanded, symmetric, and clear. No pneumothorax or pleural effusion. Cardiac size within normal limits. Pulmonary vascularity is normal. Osseous structures are age-appropriate. No acute bone abnormality. IMPRESSION: No active cardiopulmonary disease. Electronically Signed   By: Fidela Salisbury MD   On: 08/23/2020 21:51   CARDIAC CATHETERIZATION  Result Date: 08/24/2020  Ost Cx to Prox Cx lesion is 40% stenosed.  Dist Cx lesion is 40% stenosed.  Ost LM to Mid LM lesion is 50% stenosed.  Ost LAD to Prox LAD lesion is 99% stenosed.  The left ventricular systolic function is normal.  LV end diastolic pressure is normal.  The left ventricular ejection fraction is greater than 65% by visual estimate.  There is no mitral valve regurgitation.  1. Moderate distal left main stenosis 2. Severe, heavily calcified ostial/proximal LAD stenosis. This is a functional chronic occlusion. The LAD also fills from right to left collaterals through a small, early branch. 3. Mild disease in the dominant Circumflex 4. Small non-dominant RCA 5. Normal LV systolic function Recommendations: We will ask  CT surgery to see him tomorrow to review candidacy for bypass surgery. I think CABG is the best option for revascularization. I have reviewed the films with Dr. Fletcher Anon as well. Continue ASA and statin. Will restart IV  heparin 6 hours post sheath pull.     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 Continue asa/statin/cozaar. Heparin IV. No recurrent chest pain but does have sharp right sided low back pain during my exam which he states is chronic Uncontrolled Hypertension-Slightly better with the addition of cozaar. Creatinine 1.22 on 6/7. If possible switch to non-ARB IV coverage to optimize kidneys.  RBBB-holding nodal agents at this time BPH s/p prostatectomy  Plan: Continue pre-op workup for CABG with Dr. Orvan Seen tomorrow. Legs appear appropriate for obtaining saphenous vein conduit. I reviewed the procedure: Coronary artery bypass grafting with the patient today in detail. Questions were answered to the patient's satisfaction.     I  spent 30 minutes counseling the patient face to face.  Nicholes Rough, PA-C 08/25/2020 8:51 AM

## 2020-08-25 NOTE — Discharge Instructions (Signed)

## 2020-08-25 NOTE — Progress Notes (Signed)
Patient ID: Tony Shaffer, male   DOB: Jan 04, 1951, 70 y.o.   MRN: [id number]    Subjective:  Denies SSCP, palpitations or Dyspnea   Objective:  Vitals:   08/24/20 2136 08/24/20 2321 08/25/20 0518 08/25/20 0743  BP: (!) 160/78 (!) 146/80 (!) 156/69 (!) 154/79  Pulse: 67 62 68 67  Resp:  20 16 19   Temp:  (!) 97.5 F (36.4 C) 98.6 F (37 C) 98.5 F (36.9 C)  TempSrc:  Oral Oral Oral  SpO2: 100% 99% 99% 100%  Weight:   75.7 kg   Height:        Intake/Output from previous day:  Intake/Output Summary (Last 24 hours) at 08/25/2020 0747 Last data filed at 08/25/2020 0740 Gross per 24 hour  Intake 829.27 ml  Output 1150 ml  Net -320.73 ml    Physical Exam: Affect appropriate Healthy:  appears stated age HEENT: normal Neck supple with no adenopathy JVP normal no bruits no thyromegaly Lungs clear with no wheezing and good diaphragmatic motion Heart:  S1/S2 no murmur, no rub, gallop or click PMI normal Abdomen: benighn, BS positve, no tenderness, no AAA no bruit.  No HSM or HJR Distal pulses intact with no bruits No edema Neuro non-focal Skin warm and dry No muscular weakness   Lab Results: Basic Metabolic Panel: Recent Labs    08/23/20 2110  NA 136  K 3.9  CL 105  CO2 24  GLUCOSE 136*  BUN 15  CREATININE 1.22  CALCIUM 9.3   Liver Function Tests: Recent Labs    08/23/20 2110  AST 22  ALT 32  ALKPHOS 56  BILITOT 0.5  PROT 7.5  ALBUMIN 4.3   No results for input(s): LIPASE, AMYLASE in the last 72 hours. CBC: Recent Labs    08/23/20 2110 08/24/20 0500  WBC 7.2 6.9  NEUTROABS 4.4  --   HGB 13.8 13.5  HCT 43.1 42.3  MCV 88.1 88.9  PLT 279 266   Cardiac Enzymes: No results for input(s): CKTOTAL, CKMB, CKMBINDEX, TROPONINI in the last 72 hours. BNP: Invalid input(s): POCBNP D-Dimer: Recent Labs    08/23/20 2110  DDIMER <0.27   Hemoglobin A1C: No results for input(s): HGBA1C in the last 72 hours. Fasting Lipid Panel: Recent Labs     08/24/20 2022  CHOL 201*  HDL 53  LDLCALC 122*  TRIG 131  CHOLHDL 3.8   Thyroid Function Tests: No results for input(s): TSH, T4TOTAL, T3FREE, THYROIDAB in the last 72 hours.  Invalid input(s): FREET3 Anemia Panel: No results for input(s): VITAMINB12, FOLATE, FERRITIN, TIBC, IRON, RETICCTPCT in the last 72 hours.  Imaging: DG Chest 2 View  Result Date: 08/23/2020 CLINICAL DATA:  Chest pain EXAM: CHEST - 2 VIEW COMPARISON:  10/06/2012 FINDINGS: Lungs are well expanded, symmetric, and clear. No pneumothorax or pleural effusion. Cardiac size within normal limits. Pulmonary vascularity is normal. Osseous structures are age-appropriate. No acute bone abnormality. IMPRESSION: No active cardiopulmonary disease. Electronically Signed   By: Fidela Salisbury MD   On: 08/23/2020 21:51   CARDIAC CATHETERIZATION  Result Date: 08/24/2020  Ost Cx to Prox Cx lesion is 40% stenosed.  Dist Cx lesion is 40% stenosed.  Ost LM to Mid LM lesion is 50% stenosed.  Ost LAD to Prox LAD lesion is 99% stenosed.  The left ventricular systolic function is normal.  LV end diastolic pressure is normal.  The left ventricular ejection fraction is greater than 65% by visual estimate.  There is no mitral valve regurgitation.  1. Moderate distal left main stenosis 2. Severe, heavily calcified ostial/proximal LAD stenosis. This is a functional chronic occlusion. The LAD also fills from right to left collaterals through a small, early branch. 3. Mild disease in the dominant Circumflex 4. Small non-dominant RCA 5. Normal LV systolic function Recommendations: We will ask  CT surgery to see him tomorrow to review candidacy for bypass surgery. I think CABG is the best option for revascularization. I have reviewed the films with Dr. Fletcher Anon as well. Continue ASA and statin. Will restart IV heparin 6 hours post sheath pull.    Cardiac Studies:  ECG: SR no acute ST changes    Telemetry:  NSR 08/25/2020   Echo:   Medications:     aspirin EC  81 mg Oral Daily   atorvastatin  40 mg Oral Daily   fluticasone  2 spray Each Nare BID   losartan  25 mg Oral Daily   pantoprazole  40 mg Oral BID   sodium chloride flush  3 mL Intravenous Q12H      sodium chloride     heparin 850 Units/hr (08/25/20 0740)    Assessment/Plan:   Angina: cath yesterday with LM disease Needs CABG has not been seen by CVTS yet will order pre CABG dopplers continue heparin ASA and statin   Jenkins Rouge 08/25/2020, 7:47 AM

## 2020-08-25 NOTE — Progress Notes (Signed)
ANTICOAGULATION CONSULT NOTE  Pharmacy Consult for Heparin Indication: chest pain/ACS  No Known Allergies  Patient Measurements: Height: 5\' 9"  (175.3 cm) Weight: 75.7 kg (166 lb 12.8 oz) IBW/kg (Calculated) : 70.7  Vital Signs: Temp: 98.5 F (36.9 C) (06/09 0743) Temp Source: Oral (06/09 0743) BP: 154/79 (06/09 0743) Pulse Rate: 67 (06/09 0743)  Labs: Recent Labs    08/23/20 2110 08/23/20 2341 08/24/20 0500 08/24/20 0800 08/25/20 0824  HGB 13.8  --  13.5  --  14.4  HCT 43.1  --  42.3  --  45.1  PLT 279  --  266  --  283  HEPARINUNFRC  --   --   --  0.79* 0.16*  CREATININE 1.22  --   --   --   --   TROPONINIHS 52* 85*  --   --   --      Estimated Creatinine Clearance: 56.3 mL/min (by C-G formula based on SCr of 1.22 mg/dL).   Medications:  Infusions:   sodium chloride     [START ON 08/26/2020]  ceFAZolin (ANCEF) IV     [START ON 08/26/2020]  ceFAZolin (ANCEF) IV     [START ON 08/26/2020] dexmedetomidine     [START ON 08/26/2020] heparin 30,000 units/NS 1000 mL solution for CELLSAVER     heparin 850 Units/hr (08/25/20 0740)   [START ON 08/26/2020] milrinone     [START ON 08/26/2020] nitroGLYCERIN     [START ON 08/26/2020] norepinephrine     [START ON 08/26/2020] tranexamic acid (CYKLOKAPRON) infusion ([facility])     [START ON 08/26/2020] vancomycin      Assessment: 70 yo M with chest pain, elevated troponin. Pharmacy consulted to dose IV heparin. Planning for cardiac cath 6/8. Not on anticoagulation PTA  Today, 08/25/2020: Hgb/Plt remain stable WNL SCr WNL; no recent baseline for comparison Heparin level low on 850 units/hr No bleeding or infusion issues noted per RN; level drawn from opposite arm as heparin infusion  Goal of Therapy:  Heparin level 0.3-0.7 units/ml Monitor platelets by anticoagulation protocol: Yes   Plan:  Increase heparin to 950 units/hr Recheck 8 hr heparin level Daily CBC and heparin level Monitor for s/s bleeding or worsening  thrombosis  Alanda Slim, PharmD, [facility] Clinical Pharmacist Please see AMION for all Pharmacists' Contact Phone Numbers 08/25/2020, 10:06 AM

## 2020-08-25 NOTE — Progress Notes (Signed)
  Echocardiogram 2D Echocardiogram has been performed.  Merrie Roof F 08/25/2020, 4:24 PM

## 2020-08-25 NOTE — Hospital Course (Addendum)
HPI: Mr. Tony Shaffer is a 70 year old male patient with past medical history significant for right bundle branch block, BPH status post prostatectomy in 2014, and hypertension who was previously referred to Dr. Audie Box in September of last year for evaluation of abnormal EKG.  He was noted to have a right bundle branch block at this time and his blood pressure was elevated during the visit.  Echocardiogram was recommended to rule out structural heart disease.  Echocardiogram was performed on 12/11/2019 which showed an EF of 60 to 65% and no significant valvular disease.  Since his visit he had had some substernal chest pressure radiating across his chest since fall of last year.  This past spring he increased his exercise level and the chest pain came back with activity.  Over the past few months he has noticed increasing intensity of the chest pain and frequency.  He reached out to his primary care provider last week who obtained some blood work.  On 08/23/2020 he was outside playing with his dog and experienced 2 episodes of chest pain relieved by rest.  His lab results showed elevated D-dimer and he was instructed to go to the emergency room.   During his time in the [facility] long ED ,they did a repeat D-dimer which was normal however serial enzymes were mildly elevated.  His initial troponin was 52 and subsequently went up to 85.  His blood pressure was 814 systolic.  He was placed on IV heparin and cardiology was consulted.  He underwent cardiac catheterization on 08/24/2020 which showed ostial to proximal circumflex stenosis of 40%, distal circumflex lesion of 40%, ostial to mid left main lesion of 50%, ostial to proximal LAD lesion of 99%, and a left ventricular ejection fraction of 65%.  Due to his severe LAD disease, cardiology recommended revascularization via coronary artery bypass grafting.  We are consulted for possible surgical revascularization.   He does stay pretty active at home and was  running/walking more frequently before his symptoms progressed. He does have a family history of heart disease in his mother and sister.   Dr. Orvan Seen discussed the need for coronary artery bypass grafting surgery. Potential risks, benefits, and complications of the surgery were discussed with the patient and he agreed to proceed with surgery. Pre operative carotid duplex US showed no significant internal carotid artery stenosis bilaterally. He underwent a CABG x 4 on 08/26/2020.  Hospital Course:

## 2020-08-25 NOTE — Progress Notes (Signed)
CARDIAC REHAB PHASE I   Preop education completed with pt and family. Pt educated on importance of IS, walks, and sternal precautions postop. Pt given cardiac surgery booklet, [facility] care guide, and in-the-tube sheet. Pt and family deny questions or concerns at this time. Will continue to follow pt throughout his stay.  [place], RN BSN 08/25/2020 3:10 PM

## 2020-08-25 NOTE — Progress Notes (Signed)
Attempted pre-CABG Dopplers, however patient is speaking with a provider at this time. Will attempt again later as schedule permits.  08/25/2020 9:17 AM Kelby Aline., MHA, RVT, RDCS, RDMS

## 2020-08-25 NOTE — Anesthesia Preprocedure Evaluation (Addendum)
Anesthesia Evaluation  Patient identified by MRN, date of birth, ID band Patient awake    Reviewed: Allergy & Precautions, NPO status , Patient's Chart, lab work & pertinent test results  Airway Mallampati: II  TM Distance: >3 FB     Dental   Pulmonary neg pulmonary ROS,    breath sounds clear to auscultation       Cardiovascular hypertension, Pt. on medications + CAD (mod LM, severe LAD, mild Cx) and + Past MI   Rhythm:Regular Rate:Normal  11/2019 ECHO: EF 60-65%, mild LVH, Grade 1 DD, no significant valvular abnormalities   Neuro/Psych negative neurological ROS     GI/Hepatic Neg liver ROS, GERD  Medicated,  Endo/Other  negative endocrine ROS  Renal/GU negative Renal ROS     Musculoskeletal   Abdominal   Peds  Hematology   Anesthesia Other Findings   Reproductive/Obstetrics                            Anesthesia Physical Anesthesia Plan  ASA: 3  Anesthesia Plan: General   Post-op Pain Management:    Induction: Intravenous  PONV Risk Score and Plan: 2 and Treatment may vary due to age or medical condition, Ondansetron and Midazolam  Airway Management Planned: Oral ETT  Additional Equipment: Arterial line, PA Cath, TEE and Ultrasound Guidance Line Placement  Intra-op Plan:   Post-operative Plan: Post-operative intubation/ventilation  Informed Consent: I have reviewed the patients History and Physical, chart, labs and discussed the procedure including the risks, benefits and alternatives for the proposed anesthesia with the patient or authorized representative who has indicated his/her understanding and acceptance.     Dental advisory given  Plan Discussed with: CRNA and Anesthesiologist  Anesthesia Plan Comments:        Anesthesia Quick Evaluation

## 2020-08-25 NOTE — Progress Notes (Signed)
   08/25/20 1200  Clinical Encounter Type  Visited With Patient  Visit Type Spiritual support;Social support  Referral From Nurse  Consult/Referral To Chaplain  Spiritual Encounters  Spiritual Needs Prayer;Emotional  The chaplain responded to the request for prayer. The patient and chaplain talked about the [place]. The patient has strong faith. The patient is having a procedure tomorrow and talked about his confidence in God's ability to bring him out of surgery. The patient is celebrating his 70th birthday today. The chaplain provided social support, spiritual support, and said a prayer for the patient and his pending procedure. The chaplain will follow up as needed.

## 2020-08-25 NOTE — Progress Notes (Signed)
ANTICOAGULATION CONSULT NOTE  Pharmacy Consult for Heparin Indication: chest pain/ACS  No Known Allergies  Patient Measurements: Height: 5\' 9"  (175.3 cm) Weight: 75.7 kg (166 lb 12.8 oz) IBW/kg (Calculated) : 70.7 Heparin Dosing Wt: 75 kg  Vital Signs: Temp: 98.3 F (36.8 C) (06/09 1651) Temp Source: Oral (06/09 1651) BP: 174/88 (06/09 1651) Pulse Rate: 77 (06/09 1651)  Labs: Recent Labs    08/23/20 2110 08/23/20 2341 08/24/20 0500 08/24/20 0800 08/25/20 0824 08/25/20 1842  HGB 13.8  --  13.5  --  14.4  --   HCT 43.1  --  42.3  --  45.1  --   PLT 279  --  266  --  283  --   HEPARINUNFRC  --   --   --  0.79* 0.16* 0.12*  CREATININE 1.22  --   --   --  1.11  --   TROPONINIHS 52* 85*  --   --   --   --      Estimated Creatinine Clearance: 61.9 mL/min (by C-G formula based on SCr of 1.11 mg/dL).   Medications:  Infusions:   sodium chloride     [START ON 08/26/2020]  ceFAZolin (ANCEF) IV     [START ON 08/26/2020]  ceFAZolin (ANCEF) IV     [START ON 08/26/2020] dexmedetomidine     [START ON 08/26/2020] heparin 30,000 units/NS 1000 mL solution for CELLSAVER     heparin 950 Units/hr (08/25/20 1144)   [START ON 08/26/2020] milrinone     [START ON 08/26/2020] nitroGLYCERIN     [START ON 08/26/2020] norepinephrine     [START ON 08/26/2020] tranexamic acid (CYKLOKAPRON) infusion ([facility])     [START ON 08/26/2020] vancomycin      Assessment: 70 yo M with chest pain, elevated troponin. Pharmacy consulted to dose IV heparin. Planning for cardiac cath 6/8. Not on anticoagulation PTA  Heparin level this evening is SUBtherapeutic despite a rate increase earlier today (HL 0.12 << 0.16, goal of 0.3-0.7). Noted to be running in a L-AC PIV site - some beeping when patient bends arm but not excessive per patient report.   Goal of Therapy:  Heparin level 0.3-0.7 units/ml Monitor platelets by anticoagulation protocol: Yes   Plan:  - Increase Heparin to 1100 units/hr (11 ml/hr) - Will  continue to monitor for any signs/symptoms of bleeding and will follow up with heparin level in 8 hours vs off in AM for CABG  Thank you for allowing pharmacy to be a part of this patient's care.  Alycia Rossetti, PharmD, BCPS Clinical Pharmacist Clinical phone for 08/25/2020: [phone] 08/25/2020 8:45 PM   **Pharmacist phone directory can now be found on [email or web address] ([email or web address]).  Listed under [place].

## 2020-08-25 NOTE — Progress Notes (Signed)
RT NOTES: ABG obtained and walked to lab and hand delivered to lab tech.

## 2020-08-26 ENCOUNTER — Inpatient Hospital Stay (HOSPITAL_COMMUNITY): Payer: Medicare HMO

## 2020-08-26 ENCOUNTER — Inpatient Hospital Stay (HOSPITAL_COMMUNITY): Payer: Medicare HMO | Admitting: Anesthesiology

## 2020-08-26 ENCOUNTER — Inpatient Hospital Stay (HOSPITAL_COMMUNITY)
Admission: EM | Disposition: A | Discharge status: Home Health | Payer: Self-pay | Source: Ambulatory Visit | Attending: Cardiothoracic Surgery

## 2020-08-26 DIAGNOSIS — I2510 Atherosclerotic heart disease of native coronary artery without angina pectoris: Secondary | ICD-10-CM | POA: Diagnosis present

## 2020-08-26 DIAGNOSIS — Z951 Presence of aortocoronary bypass graft: Secondary | ICD-10-CM

## 2020-08-26 HISTORY — PX: CORONARY ARTERY BYPASS GRAFT: SHX141

## 2020-08-26 HISTORY — PX: ENDOVEIN HARVEST OF GREATER SAPHENOUS VEIN: SHX5059

## 2020-08-26 HISTORY — PX: TEE WITHOUT CARDIOVERSION: SHX5443

## 2020-08-26 HISTORY — DX: Presence of aortocoronary bypass graft: Z95.1

## 2020-08-26 LAB — POCT I-STAT, CHEM 8
BUN: 13 mg/dL (ref 8–23)
BUN: 12 mg/dL (ref 8–23)
BUN: 11 mg/dL (ref 8–23)
BUN: 10 mg/dL (ref 8–23)
Calcium, Ion: 1.27 mmol/L (ref 1.15–1.40)
Calcium, Ion: 1.25 mmol/L (ref 1.15–1.40)
Calcium, Ion: 0.97 mmol/L — ABNORMAL LOW (ref 1.15–1.40)
Calcium, Ion: 1.03 mmol/L — ABNORMAL LOW (ref 1.15–1.40)
Chloride: 102 mmol/L (ref 98–111)
Chloride: 103 mmol/L (ref 98–111)
Chloride: 101 mmol/L (ref 98–111)
Chloride: 107 mmol/L (ref 98–111)
Creatinine, Ser: 0.9 mg/dL (ref 0.61–1.24)
Creatinine, Ser: 0.9 mg/dL (ref 0.61–1.24)
Creatinine, Ser: 0.8 mg/dL (ref 0.61–1.24)
Creatinine, Ser: 0.7 mg/dL (ref 0.61–1.24)
Glucose, Bld: 114 mg/dL — ABNORMAL HIGH (ref 70–99)
Glucose, Bld: 129 mg/dL — ABNORMAL HIGH (ref 70–99)
Glucose, Bld: 109 mg/dL — ABNORMAL HIGH (ref 70–99)
Glucose, Bld: 160 mg/dL — ABNORMAL HIGH (ref 70–99)
HCT: 38 % — ABNORMAL LOW (ref 39.0–52.0)
HCT: 35 % — ABNORMAL LOW (ref 39.0–52.0)
HCT: 25 % — ABNORMAL LOW (ref 39.0–52.0)
HCT: 28 % — ABNORMAL LOW (ref 39.0–52.0)
Hemoglobin: 12.9 g/dL — ABNORMAL LOW (ref 13.0–17.0)
Hemoglobin: 11.9 g/dL — ABNORMAL LOW (ref 13.0–17.0)
Hemoglobin: 8.5 g/dL — ABNORMAL LOW (ref 13.0–17.0)
Hemoglobin: 9.5 g/dL — ABNORMAL LOW (ref 13.0–17.0)
Potassium: 3.8 mmol/L (ref 3.5–5.1)
Potassium: 4.5 mmol/L (ref 3.5–5.1)
Potassium: 6.8 mmol/L (ref 3.5–5.1)
Potassium: 5.4 mmol/L — ABNORMAL HIGH (ref 3.5–5.1)
Sodium: 137 mmol/L (ref 135–145)
Sodium: 136 mmol/L (ref 135–145)
Sodium: 132 mmol/L — ABNORMAL LOW (ref 135–145)
Sodium: 138 mmol/L (ref 135–145)
TCO2: 25 mmol/L (ref 22–32)
TCO2: 24 mmol/L (ref 22–32)
TCO2: 25 mmol/L (ref 22–32)
TCO2: 22 mmol/L (ref 22–32)

## 2020-08-26 LAB — POCT I-STAT 7, (LYTES, BLD GAS, ICA,H+H)
Acid-base deficit: 1 mmol/L (ref 0.0–2.0)
Acid-base deficit: 3 mmol/L — ABNORMAL HIGH (ref 0.0–2.0)
Acid-base deficit: 3 mmol/L — ABNORMAL HIGH (ref 0.0–2.0)
Acid-base deficit: 6 mmol/L — ABNORMAL HIGH (ref 0.0–2.0)
Acid-base deficit: 3 mmol/L — ABNORMAL HIGH (ref 0.0–2.0)
Acid-base deficit: 3 mmol/L — ABNORMAL HIGH (ref 0.0–2.0)
Bicarbonate: 24.1 mmol/L (ref 20.0–28.0)
Bicarbonate: 22.1 mmol/L (ref 20.0–28.0)
Bicarbonate: 21.9 mmol/L (ref 20.0–28.0)
Bicarbonate: 19.1 mmol/L — ABNORMAL LOW (ref 20.0–28.0)
Bicarbonate: 22.7 mmol/L (ref 20.0–28.0)
Bicarbonate: 22.4 mmol/L (ref 20.0–28.0)
Calcium, Ion: 0.95 mmol/L — ABNORMAL LOW (ref 1.15–1.40)
Calcium, Ion: 1.01 mmol/L — ABNORMAL LOW (ref 1.15–1.40)
Calcium, Ion: 1.01 mmol/L — ABNORMAL LOW (ref 1.15–1.40)
Calcium, Ion: 1.09 mmol/L — ABNORMAL LOW (ref 1.15–1.40)
Calcium, Ion: 1.08 mmol/L — ABNORMAL LOW (ref 1.15–1.40)
Calcium, Ion: 1.07 mmol/L — ABNORMAL LOW (ref 1.15–1.40)
HCT: 26 % — ABNORMAL LOW (ref 39.0–52.0)
HCT: 26 % — ABNORMAL LOW (ref 39.0–52.0)
HCT: 26 % — ABNORMAL LOW (ref 39.0–52.0)
HCT: 27 % — ABNORMAL LOW (ref 39.0–52.0)
HCT: 26 % — ABNORMAL LOW (ref 39.0–52.0)
HCT: 25 % — ABNORMAL LOW (ref 39.0–52.0)
Hemoglobin: 8.8 g/dL — ABNORMAL LOW (ref 13.0–17.0)
Hemoglobin: 8.8 g/dL — ABNORMAL LOW (ref 13.0–17.0)
Hemoglobin: 8.8 g/dL — ABNORMAL LOW (ref 13.0–17.0)
Hemoglobin: 9.2 g/dL — ABNORMAL LOW (ref 13.0–17.0)
Hemoglobin: 8.8 g/dL — ABNORMAL LOW (ref 13.0–17.0)
Hemoglobin: 8.5 g/dL — ABNORMAL LOW (ref 13.0–17.0)
O2 Saturation: 100 %
O2 Saturation: 100 %
O2 Saturation: 100 %
O2 Saturation: 99 %
O2 Saturation: 98 %
O2 Saturation: 94 %
Patient temperature: 34.9
Patient temperature: 37.7
Patient temperature: 38.3
Potassium: 4.9 mmol/L (ref 3.5–5.1)
Potassium: 6.2 mmol/L — ABNORMAL HIGH (ref 3.5–5.1)
Potassium: 5.4 mmol/L — ABNORMAL HIGH (ref 3.5–5.1)
Potassium: 4.4 mmol/L (ref 3.5–5.1)
Potassium: 4.2 mmol/L (ref 3.5–5.1)
Potassium: 3.9 mmol/L (ref 3.5–5.1)
Sodium: 135 mmol/L (ref 135–145)
Sodium: 137 mmol/L (ref 135–145)
Sodium: 139 mmol/L (ref 135–145)
Sodium: 143 mmol/L (ref 135–145)
Sodium: 144 mmol/L (ref 135–145)
Sodium: 144 mmol/L (ref 135–145)
TCO2: 25 mmol/L (ref 22–32)
TCO2: 23 mmol/L (ref 22–32)
TCO2: 23 mmol/L (ref 22–32)
TCO2: 20 mmol/L — ABNORMAL LOW (ref 22–32)
TCO2: 24 mmol/L (ref 22–32)
TCO2: 24 mmol/L (ref 22–32)
pCO2 arterial: 39.5 mmHg (ref 32.0–48.0)
pCO2 arterial: 36.5 mmHg (ref 32.0–48.0)
pCO2 arterial: 38.8 mmHg (ref 32.0–48.0)
pCO2 arterial: 33.2 mmHg (ref 32.0–48.0)
pCO2 arterial: 43.2 mmHg (ref 32.0–48.0)
pCO2 arterial: 41.6 mmHg (ref 32.0–48.0)
pH, Arterial: 7.394 (ref 7.350–7.450)
pH, Arterial: 7.39 (ref 7.350–7.450)
pH, Arterial: 7.36 (ref 7.350–7.450)
pH, Arterial: 7.358 (ref 7.350–7.450)
pH, Arterial: 7.332 — ABNORMAL LOW (ref 7.350–7.450)
pH, Arterial: 7.344 — ABNORMAL LOW (ref 7.350–7.450)
pO2, Arterial: 374 mmHg — ABNORMAL HIGH (ref 83.0–108.0)
pO2, Arterial: 272 mmHg — ABNORMAL HIGH (ref 83.0–108.0)
pO2, Arterial: 398 mmHg — ABNORMAL HIGH (ref 83.0–108.0)
pO2, Arterial: 140 mmHg — ABNORMAL HIGH (ref 83.0–108.0)
pO2, Arterial: 114 mmHg — ABNORMAL HIGH (ref 83.0–108.0)
pO2, Arterial: 80 mmHg — ABNORMAL LOW (ref 83.0–108.0)

## 2020-08-26 LAB — BASIC METABOLIC PANEL
Anion gap: 8 (ref 5–15)
Anion gap: 8 (ref 5–15)
BUN: 14 mg/dL (ref 8–23)
BUN: 9 mg/dL (ref 8–23)
CO2: 25 mmol/L (ref 22–32)
CO2: 23 mmol/L (ref 22–32)
Calcium: 9.1 mg/dL (ref 8.9–10.3)
Calcium: 7.3 mg/dL — ABNORMAL LOW (ref 8.9–10.3)
Chloride: 101 mmol/L (ref 98–111)
Chloride: 111 mmol/L (ref 98–111)
Creatinine, Ser: 1.17 mg/dL (ref 0.61–1.24)
Creatinine, Ser: 0.99 mg/dL (ref 0.61–1.24)
GFR, Estimated: >60 mL/min (ref >60)
GFR, Estimated: >60 mL/min (ref >60)
Glucose, Bld: 110 mg/dL — ABNORMAL HIGH (ref 70–99)
Glucose, Bld: 139 mg/dL — ABNORMAL HIGH (ref 70–99)
Potassium: 3.9 mmol/L (ref 3.5–5.1)
Potassium: 4.1 mmol/L (ref 3.5–5.1)
Sodium: 134 mmol/L — ABNORMAL LOW (ref 135–145)
Sodium: 142 mmol/L (ref 135–145)

## 2020-08-26 LAB — HEPARIN LEVEL (UNFRACTIONATED): Heparin Unfractionated: 0.63 IU/mL (ref 0.30–0.70)

## 2020-08-26 LAB — CBC
HCT: 42.6 % (ref 39.0–52.0)
HCT: 29.6 % — ABNORMAL LOW (ref 39.0–52.0)
HCT: 28.4 % — ABNORMAL LOW (ref 39.0–52.0)
Hemoglobin: 13.6 g/dL (ref 13.0–17.0)
Hemoglobin: 9.5 g/dL — ABNORMAL LOW (ref 13.0–17.0)
Hemoglobin: 9.1 g/dL — ABNORMAL LOW (ref 13.0–17.0)
MCH: 28 pg (ref 26.0–34.0)
MCH: 28.8 pg (ref 26.0–34.0)
MCH: 28.5 pg (ref 26.0–34.0)
MCHC: 31.9 g/dL (ref 30.0–36.0)
MCHC: 32.1 g/dL (ref 30.0–36.0)
MCHC: 32 g/dL (ref 30.0–36.0)
MCV: 87.8 fL (ref 80.0–100.0)
MCV: 89.7 fL (ref 80.0–100.0)
MCV: 89 fL (ref 80.0–100.0)
Platelets: 267 10*3/uL (ref 150–400)
Platelets: 126 10*3/uL — ABNORMAL LOW (ref 150–400)
Platelets: 172 10*3/uL (ref 150–400)
RBC: 4.85 MIL/uL (ref 4.22–5.81)
RBC: 3.3 MIL/uL — ABNORMAL LOW (ref 4.22–5.81)
RBC: 3.19 MIL/uL — ABNORMAL LOW (ref 4.22–5.81)
RDW: 13.2 % (ref 11.5–15.5)
RDW: 13.2 % (ref 11.5–15.5)
RDW: 13.2 % (ref 11.5–15.5)
WBC: 9.1 10*3/uL (ref 4.0–10.5)
WBC: 8.8 10*3/uL (ref 4.0–10.5)
WBC: 11.1 10*3/uL — ABNORMAL HIGH (ref 4.0–10.5)
nRBC: 0 % (ref 0.0–0.2)
nRBC: 0 % (ref 0.0–0.2)
nRBC: 0 % (ref 0.0–0.2)

## 2020-08-26 LAB — POCT I-STAT EG7
Acid-base deficit: 3 mmol/L — ABNORMAL HIGH (ref 0.0–2.0)
Bicarbonate: 22 mmol/L (ref 20.0–28.0)
Calcium, Ion: 1 mmol/L — ABNORMAL LOW (ref 1.15–1.40)
HCT: 26 % — ABNORMAL LOW (ref 39.0–52.0)
Hemoglobin: 8.8 g/dL — ABNORMAL LOW (ref 13.0–17.0)
O2 Saturation: 76 %
Potassium: 6.2 mmol/L — ABNORMAL HIGH (ref 3.5–5.1)
Sodium: 133 mmol/L — ABNORMAL LOW (ref 135–145)
TCO2: 23 mmol/L (ref 22–32)
pCO2, Ven: 37.9 mmHg — ABNORMAL LOW (ref 44.0–60.0)
pH, Ven: 7.372 (ref 7.250–7.430)
pO2, Ven: 41 mmHg (ref 32.0–45.0)

## 2020-08-26 LAB — GLUCOSE, CAPILLARY
Glucose-Capillary: 111 mg/dL — ABNORMAL HIGH (ref 70–99)
Glucose-Capillary: 87 mg/dL (ref 70–99)
Glucose-Capillary: 88 mg/dL (ref 70–99)
Glucose-Capillary: 153 mg/dL — ABNORMAL HIGH (ref 70–99)
Glucose-Capillary: 149 mg/dL — ABNORMAL HIGH (ref 70–99)
Glucose-Capillary: 154 mg/dL — ABNORMAL HIGH (ref 70–99)
Glucose-Capillary: 141 mg/dL — ABNORMAL HIGH (ref 70–99)
Glucose-Capillary: 155 mg/dL — ABNORMAL HIGH (ref 70–99)
Glucose-Capillary: 175 mg/dL — ABNORMAL HIGH (ref 70–99)
Glucose-Capillary: 156 mg/dL — ABNORMAL HIGH (ref 70–99)
Glucose-Capillary: 115 mg/dL — ABNORMAL HIGH (ref 70–99)

## 2020-08-26 LAB — PROTIME-INR
INR: 1.4 — ABNORMAL HIGH (ref 0.8–1.2)
Prothrombin Time: 17.4 seconds — ABNORMAL HIGH (ref 11.4–15.2)

## 2020-08-26 LAB — PLATELET COUNT: Platelets: 189 10*3/uL (ref 150–400)

## 2020-08-26 LAB — HEMOGLOBIN AND HEMATOCRIT, BLOOD
HCT: 28.2 % — ABNORMAL LOW (ref 39.0–52.0)
Hemoglobin: 9.1 g/dL — ABNORMAL LOW (ref 13.0–17.0)

## 2020-08-26 LAB — HEMOGLOBIN A1C
Hgb A1c MFr Bld: 5.7 % — ABNORMAL HIGH (ref 4.8–5.6)
Mean Plasma Glucose: 117 mg/dL

## 2020-08-26 LAB — APTT: aPTT: 35 seconds (ref 24–36)

## 2020-08-26 LAB — MAGNESIUM: Magnesium: 3 mg/dL — ABNORMAL HIGH (ref 1.7–2.4)

## 2020-08-26 SURGERY — CORONARY ARTERY BYPASS GRAFTING (CABG)
Anesthesia: General | Site: Leg Upper | Laterality: Right

## 2020-08-26 MED ORDER — BUPIVACAINE LIPOSOME 1.3 % IJ SUSP
INTRAMUSCULAR | Status: AC
  Filled 2020-08-26: qty 20

## 2020-08-26 MED ORDER — CHLORHEXIDINE GLUCONATE CLOTH 2 % EX PADS
6.0000 | MEDICATED_PAD | Freq: Every day | CUTANEOUS | Status: DC
  Administered 2020-08-26 – 2020-08-28 (×3): 6 via TOPICAL

## 2020-08-26 MED ORDER — PANTOPRAZOLE SODIUM 40 MG PO TBEC: 40.0000 mg | DELAYED_RELEASE_TABLET | Freq: Every day | ORAL | Status: DC

## 2020-08-26 MED ORDER — PHENYLEPHRINE HCL-NACL 20-0.9 MG/250ML-% IV SOLN: 0.0000 ug/min | INTRAVENOUS | Status: DC

## 2020-08-26 MED ORDER — STERILE WATER FOR INJECTION IJ SOLN
INTRAMUSCULAR | Status: DC | PRN
  Administered 2020-08-26: 10 mL

## 2020-08-26 MED ORDER — FAMOTIDINE IN NACL 20-0.9 MG/50ML-% IV SOLN
20.0000 mg | Freq: Two times a day (BID) | INTRAVENOUS | Status: AC
  Administered 2020-08-26 (×2): 20 mg via INTRAVENOUS
  Filled 2020-08-26 (×2): qty 50

## 2020-08-26 MED ORDER — ALBUMIN HUMAN 5 % IV SOLN: INTRAVENOUS | Status: DC | PRN

## 2020-08-26 MED ORDER — PANTOPRAZOLE SODIUM 40 MG PO TBEC
40.0000 mg | DELAYED_RELEASE_TABLET | Freq: Two times a day (BID) | ORAL | Status: DC
  Administered 2020-08-27 – 2020-08-31 (×9): 40 mg via ORAL
  Filled 2020-08-26 (×9): qty 1

## 2020-08-26 MED ORDER — POTASSIUM CHLORIDE 10 MEQ/50ML IV SOLN
10.0000 meq | INTRAVENOUS | Status: AC
Start: 2020-08-26 — End: 2020-08-26

## 2020-08-26 MED ORDER — ROCURONIUM BROMIDE 10 MG/ML (PF) SYRINGE
PREFILLED_SYRINGE | INTRAVENOUS | Status: DC | PRN
  Administered 2020-08-26 (×2): 50 mg via INTRAVENOUS
  Administered 2020-08-26: 20 mg via INTRAVENOUS
  Administered 2020-08-26: 50 mg via INTRAVENOUS

## 2020-08-26 MED ORDER — ASPIRIN 81 MG PO CHEW: 324.0000 mg | CHEWABLE_TABLET | Freq: Every day | ORAL | Status: DC

## 2020-08-26 MED ORDER — SODIUM CHLORIDE (PF) 0.9 % IJ SOLN
INTRAMUSCULAR | Status: AC
  Filled 2020-08-26: qty 10

## 2020-08-26 MED ORDER — METOPROLOL TARTRATE 12.5 MG HALF TABLET: 12.5000 mg | ORAL_TABLET | Freq: Two times a day (BID) | ORAL | Status: DC

## 2020-08-26 MED ORDER — PROTAMINE SULFATE 10 MG/ML IV SOLN
INTRAVENOUS | Status: AC
  Filled 2020-08-26: qty 25

## 2020-08-26 MED ORDER — ARTIFICIAL TEARS OPHTHALMIC OINT
TOPICAL_OINTMENT | OPHTHALMIC | Status: DC | PRN
  Administered 2020-08-26: 1 via OPHTHALMIC

## 2020-08-26 MED ORDER — SODIUM CHLORIDE 0.9 % IV SOLN: INTRAVENOUS | Status: DC

## 2020-08-26 MED ORDER — ONDANSETRON HCL 4 MG/2ML IJ SOLN
4.0000 mg | Freq: Four times a day (QID) | INTRAMUSCULAR | Status: DC | PRN
  Administered 2020-08-26 – 2020-08-29 (×2): 4 mg via INTRAVENOUS
  Filled 2020-08-26 (×2): qty 2

## 2020-08-26 MED ORDER — METOPROLOL TARTRATE 25 MG/10 ML ORAL SUSPENSION: 12.5000 mg | Freq: Two times a day (BID) | ORAL | Status: DC

## 2020-08-26 MED ORDER — 0.9 % SODIUM CHLORIDE (POUR BTL) OPTIME
TOPICAL | Status: DC | PRN
  Administered 2020-08-26: 5000 mL

## 2020-08-26 MED ORDER — LEVALBUTEROL TARTRATE 45 MCG/ACT IN AERO: 2.0000 | INHALATION_SPRAY | Freq: Four times a day (QID) | RESPIRATORY_TRACT | Status: DC

## 2020-08-26 MED ORDER — MAGNESIUM SULFATE 4 GM/100ML IV SOLN
4.0000 g | Freq: Once | INTRAVENOUS | Status: AC
  Administered 2020-08-26: 4 g via INTRAVENOUS
  Filled 2020-08-26: qty 100

## 2020-08-26 MED ORDER — CHLORHEXIDINE GLUCONATE 0.12 % MT SOLN
15.0000 mL | OROMUCOSAL | Status: AC
  Administered 2020-08-26: 15 mL via OROMUCOSAL

## 2020-08-26 MED ORDER — ALBUMIN HUMAN 5 % IV SOLN
250.0000 mL | INTRAVENOUS | Status: AC | PRN
  Administered 2020-08-26 (×4): 12.5 g via INTRAVENOUS
  Filled 2020-08-26 (×3): qty 250

## 2020-08-26 MED ORDER — STERILE WATER FOR INJECTION IJ SOLN
INTRAMUSCULAR | Status: AC
  Filled 2020-08-26: qty 10

## 2020-08-26 MED ORDER — PROPOFOL 10 MG/ML IV BOLUS
INTRAVENOUS | Status: AC
  Filled 2020-08-26: qty 20

## 2020-08-26 MED ORDER — ALBUMIN HUMAN 5 % IV SOLN
12.5000 g | Freq: Once | INTRAVENOUS | Status: AC
  Administered 2020-08-26: 12.5 g via INTRAVENOUS

## 2020-08-26 MED ORDER — METOPROLOL TARTRATE 5 MG/5ML IV SOLN: 2.5000 mg | INTRAVENOUS | Status: DC | PRN

## 2020-08-26 MED ORDER — LACTATED RINGERS IV SOLN: INTRAVENOUS | Status: DC | PRN

## 2020-08-26 MED ORDER — ACETAMINOPHEN 160 MG/5ML PO SOLN: 1000.0000 mg | Freq: Four times a day (QID) | ORAL | Status: DC

## 2020-08-26 MED ORDER — SODIUM CHLORIDE 0.9% FLUSH
3.0000 mL | Freq: Two times a day (BID) | INTRAVENOUS | Status: DC
  Administered 2020-08-27 (×2): 3 mL via INTRAVENOUS

## 2020-08-26 MED ORDER — ACETAMINOPHEN 500 MG PO TABS
1000.0000 mg | ORAL_TABLET | Freq: Four times a day (QID) | ORAL | Status: DC
  Administered 2020-08-27 – 2020-08-31 (×15): 1000 mg via ORAL
  Filled 2020-08-26 (×15): qty 2

## 2020-08-26 MED ORDER — BUPIVACAINE LIPOSOME 1.3 % IJ SUSP
INTRAMUSCULAR | Status: DC | PRN
  Administered 2020-08-26: 50 mL

## 2020-08-26 MED ORDER — BUPIVACAINE HCL (PF) 0.5 % IJ SOLN
INTRAMUSCULAR | Status: AC
  Filled 2020-08-26: qty 30

## 2020-08-26 MED ORDER — ACETAMINOPHEN 160 MG/5ML PO SOLN: 650.0000 mg | Freq: Once | ORAL | Status: AC

## 2020-08-26 MED ORDER — ORAL CARE MOUTH RINSE
15.0000 mL | OROMUCOSAL | Status: DC
  Administered 2020-08-26: 15 mL via OROMUCOSAL

## 2020-08-26 MED ORDER — LEVALBUTEROL HCL 1.25 MG/0.5ML IN NEBU
1.2500 mg | INHALATION_SOLUTION | Freq: Three times a day (TID) | RESPIRATORY_TRACT | Status: DC
  Administered 2020-08-27 (×2): 1.25 mg via RESPIRATORY_TRACT
  Filled 2020-08-26 (×2): qty 0.5

## 2020-08-26 MED ORDER — SODIUM CHLORIDE 0.9 % IV SOLN: INTRAVENOUS | Status: DC | PRN

## 2020-08-26 MED ORDER — INSULIN REGULAR(HUMAN) IN NACL 100-0.9 UT/100ML-% IV SOLN: INTRAVENOUS | Status: DC

## 2020-08-26 MED ORDER — PROTAMINE SULFATE 10 MG/ML IV SOLN
INTRAVENOUS | Status: DC | PRN
  Administered 2020-08-26: 240 mg via INTRAVENOUS

## 2020-08-26 MED ORDER — SODIUM CHLORIDE 0.9% FLUSH
10.0000 mL | Freq: Two times a day (BID) | INTRAVENOUS | Status: DC
  Administered 2020-08-26 – 2020-08-28 (×5): 10 mL

## 2020-08-26 MED ORDER — MIDAZOLAM HCL 2 MG/2ML IJ SOLN
INTRAMUSCULAR | Status: AC
  Filled 2020-08-26: qty 2

## 2020-08-26 MED ORDER — BISACODYL 10 MG RE SUPP: 10.0000 mg | Freq: Every day | RECTAL | Status: DC

## 2020-08-26 MED ORDER — DOCUSATE SODIUM 100 MG PO CAPS
200.0000 mg | ORAL_CAPSULE | Freq: Every day | ORAL | Status: DC
  Administered 2020-08-27 – 2020-08-28 (×2): 200 mg via ORAL
  Filled 2020-08-26 (×2): qty 2

## 2020-08-26 MED ORDER — SODIUM CHLORIDE 0.9% FLUSH: 10.0000 mL | INTRAVENOUS | Status: DC | PRN

## 2020-08-26 MED ORDER — NITROGLYCERIN IN D5W 200-5 MCG/ML-% IV SOLN
0.0000 ug/min | INTRAVENOUS | Status: DC
  Filled 2020-08-26: qty 250

## 2020-08-26 MED ORDER — DEXMEDETOMIDINE HCL IN NACL 400 MCG/100ML IV SOLN
0.0000 ug/kg/h | INTRAVENOUS | Status: DC
Start: 2020-08-26 — End: 2020-08-28
  Filled 2020-08-26: qty 100

## 2020-08-26 MED ORDER — NOREPINEPHRINE 4 MG/250ML-% IV SOLN
0.0000 ug/min | INTRAVENOUS | Status: DC
  Filled 2020-08-26: qty 250

## 2020-08-26 MED ORDER — PLATELET RICH PLASMA OPTIME
Status: DC | PRN
  Administered 2020-08-26: 10 mL

## 2020-08-26 MED ORDER — SODIUM CHLORIDE 0.9% FLUSH: 3.0000 mL | INTRAVENOUS | Status: DC | PRN

## 2020-08-26 MED ORDER — LACTATED RINGERS IV SOLN
500.0000 mL | Freq: Once | INTRAVENOUS | Status: AC | PRN
  Administered 2020-08-26: 500 mL via INTRAVENOUS

## 2020-08-26 MED ORDER — LEVALBUTEROL HCL 1.25 MG/0.5ML IN NEBU
1.2500 mg | INHALATION_SOLUTION | Freq: Four times a day (QID) | RESPIRATORY_TRACT | Status: DC
  Administered 2020-08-26 (×2): 1.25 mg via RESPIRATORY_TRACT
  Filled 2020-08-26 (×2): qty 0.5

## 2020-08-26 MED ORDER — TRAMADOL HCL 50 MG PO TABS
50.0000 mg | ORAL_TABLET | ORAL | Status: DC | PRN
  Administered 2020-08-27 – 2020-08-28 (×4): 50 mg via ORAL
  Filled 2020-08-26 (×4): qty 1

## 2020-08-26 MED ORDER — PLATELET POOR PLASMA OPTIME
Status: DC | PRN
  Administered 2020-08-26: 10 mL

## 2020-08-26 MED ORDER — CHLORHEXIDINE GLUCONATE 0.12% ORAL RINSE (MEDLINE KIT): 15.0000 mL | Freq: Two times a day (BID) | OROMUCOSAL | Status: DC

## 2020-08-26 MED ORDER — PLASMA-LYTE A IV SOLN
INTRAVENOUS | Status: DC | PRN
  Administered 2020-08-26: 1000 mL

## 2020-08-26 MED ORDER — ORAL CARE MOUTH RINSE
15.0000 mL | Freq: Two times a day (BID) | OROMUCOSAL | Status: DC
  Administered 2020-08-26 – 2020-08-31 (×10): 15 mL via OROMUCOSAL

## 2020-08-26 MED ORDER — LACTATED RINGERS IV SOLN: INTRAVENOUS | Status: DC

## 2020-08-26 MED ORDER — THROMBIN 5000 UNITS EX SOLR
INTRAVENOUS | Status: DC | PRN
  Administered 2020-08-26: 2 mL

## 2020-08-26 MED ORDER — HEPARIN SODIUM (PORCINE) 1000 UNIT/ML IJ SOLN
INTRAMUSCULAR | Status: DC | PRN
  Administered 2020-08-26: 24000 [IU] via INTRAVENOUS

## 2020-08-26 MED ORDER — SODIUM BICARBONATE 8.4 % IV SOLN
100.0000 meq | Freq: Once | INTRAVENOUS | Status: AC
  Administered 2020-08-26: 100 meq via INTRAVENOUS

## 2020-08-26 MED ORDER — LIDOCAINE HCL URETHRAL/MUCOSAL 2 % EX GEL
CUTANEOUS | Status: DC | PRN
  Administered 2020-08-26: 1

## 2020-08-26 MED ORDER — CHLORHEXIDINE GLUCONATE CLOTH 2 % EX PADS: 6.0000 | MEDICATED_PAD | Freq: Every day | CUTANEOUS | Status: DC

## 2020-08-26 MED ORDER — FENTANYL CITRATE (PF) 100 MCG/2ML IJ SOLN
INTRAMUSCULAR | Status: AC
  Filled 2020-08-26: qty 2

## 2020-08-26 MED ORDER — PROPOFOL 10 MG/ML IV BOLUS
INTRAVENOUS | Status: DC | PRN
  Administered 2020-08-26: 120 mg via INTRAVENOUS
  Administered 2020-08-26: 20 mg via INTRAVENOUS
  Administered 2020-08-26: 10 mg via INTRAVENOUS
  Administered 2020-08-26 (×2): 20 mg via INTRAVENOUS

## 2020-08-26 MED ORDER — LIDOCAINE HCL (PF) 2 % IJ SOLN
INTRAMUSCULAR | Status: DC | PRN
  Administered 2020-08-26: 80 mg via INTRADERMAL

## 2020-08-26 MED ORDER — ACETAMINOPHEN 650 MG RE SUPP
650.0000 mg | Freq: Once | RECTAL | Status: AC
  Administered 2020-08-26: 650 mg via RECTAL
  Filled 2020-08-26: qty 1

## 2020-08-26 MED ORDER — MORPHINE SULFATE (PF) 2 MG/ML IV SOLN
1.0000 mg | INTRAVENOUS | Status: DC | PRN
Start: 2020-08-26 — End: 2020-08-28
  Administered 2020-08-26: 1 mg via INTRAVENOUS
  Administered 2020-08-26: 2 mg via INTRAVENOUS
  Administered 2020-08-26: 1 mg via INTRAVENOUS
  Administered 2020-08-27 (×4): 2 mg via INTRAVENOUS
  Administered 2020-08-27: 4 mg via INTRAVENOUS
  Filled 2020-08-26 (×9): qty 1

## 2020-08-26 MED ORDER — EPHEDRINE SULFATE-NACL 50-0.9 MG/10ML-% IV SOSY
PREFILLED_SYRINGE | INTRAVENOUS | Status: DC | PRN
  Administered 2020-08-26: 5 mg via INTRAVENOUS

## 2020-08-26 MED ORDER — PHENYLEPHRINE 40 MCG/ML (10ML) SYRINGE FOR IV PUSH (FOR BLOOD PRESSURE SUPPORT)
PREFILLED_SYRINGE | INTRAVENOUS | Status: DC | PRN
  Administered 2020-08-26: 40 ug via INTRAVENOUS
  Administered 2020-08-26 (×3): 80 ug via INTRAVENOUS

## 2020-08-26 MED ORDER — LIDOCAINE HCL (PF) 2 % IJ SOLN
INTRAMUSCULAR | Status: AC
  Filled 2020-08-26: qty 5

## 2020-08-26 MED ORDER — BISACODYL 5 MG PO TBEC
10.0000 mg | DELAYED_RELEASE_TABLET | Freq: Every day | ORAL | Status: DC
  Administered 2020-08-27 – 2020-08-28 (×2): 10 mg via ORAL
  Filled 2020-08-26 (×2): qty 2

## 2020-08-26 MED ORDER — SODIUM CHLORIDE 0.45 % IV SOLN: INTRAVENOUS | Status: DC | PRN

## 2020-08-26 MED ORDER — HEMOSTATIC AGENTS (NO CHARGE) OPTIME
TOPICAL | Status: DC | PRN
  Administered 2020-08-26 (×2): 1 via TOPICAL

## 2020-08-26 MED ORDER — HEPARIN SODIUM (PORCINE) 1000 UNIT/ML IJ SOLN
INTRAMUSCULAR | Status: AC
  Filled 2020-08-26: qty 1

## 2020-08-26 MED ORDER — INSULIN REGULAR(HUMAN) IN NACL 100-0.9 UT/100ML-% IV SOLN
INTRAVENOUS | Status: DC | PRN
  Administered 2020-08-26: .7 [IU]/h via INTRAVENOUS

## 2020-08-26 MED ORDER — PLASMA-LYTE A IV SOLN: INTRAVENOUS | Status: DC

## 2020-08-26 MED ORDER — CEFAZOLIN SODIUM-DEXTROSE 2-4 GM/100ML-% IV SOLN
2.0000 g | Freq: Three times a day (TID) | INTRAVENOUS | Status: AC
  Administered 2020-08-26 – 2020-08-28 (×6): 2 g via INTRAVENOUS
  Filled 2020-08-26 (×6): qty 100

## 2020-08-26 MED ORDER — MIDAZOLAM HCL (PF) 10 MG/2ML IJ SOLN
INTRAMUSCULAR | Status: AC
  Filled 2020-08-26: qty 2

## 2020-08-26 MED ORDER — MIDAZOLAM HCL 5 MG/5ML IJ SOLN
INTRAMUSCULAR | Status: DC | PRN
  Administered 2020-08-26 (×8): 1 mg via INTRAVENOUS
  Administered 2020-08-26: 2 mg via INTRAVENOUS
  Administered 2020-08-26 (×2): 1 mg via INTRAVENOUS
  Administered 2020-08-26: 2 mg via INTRAVENOUS
  Administered 2020-08-26 (×2): 1 mg via INTRAVENOUS

## 2020-08-26 MED ORDER — FENTANYL CITRATE (PF) 250 MCG/5ML IJ SOLN
INTRAMUSCULAR | Status: DC | PRN
  Administered 2020-08-26: 200 ug via INTRAVENOUS
  Administered 2020-08-26 (×5): 50 ug via INTRAVENOUS
  Administered 2020-08-26: 100 ug via INTRAVENOUS
  Administered 2020-08-26 (×2): 50 ug via INTRAVENOUS
  Administered 2020-08-26: 100 ug via INTRAVENOUS
  Administered 2020-08-26 (×3): 50 ug via INTRAVENOUS
  Administered 2020-08-26 (×2): 100 ug via INTRAVENOUS
  Administered 2020-08-26: 50 ug via INTRAVENOUS

## 2020-08-26 MED ORDER — MIDAZOLAM HCL 2 MG/2ML IJ SOLN: 2.0000 mg | INTRAMUSCULAR | Status: DC | PRN

## 2020-08-26 MED ORDER — SODIUM CHLORIDE 0.9 % IV SOLN
250.0000 mL | INTRAVENOUS | Status: DC
  Administered 2020-08-27: 250 mL via INTRAVENOUS

## 2020-08-26 MED ORDER — FENTANYL CITRATE (PF) 250 MCG/5ML IJ SOLN
INTRAMUSCULAR | Status: AC
  Filled 2020-08-26: qty 25

## 2020-08-26 MED ORDER — VANCOMYCIN HCL IN DEXTROSE 1-5 GM/200ML-% IV SOLN
1000.0000 mg | Freq: Once | INTRAVENOUS | Status: AC
  Administered 2020-08-26: 1000 mg via INTRAVENOUS
  Filled 2020-08-26: qty 200

## 2020-08-26 MED ORDER — OXYCODONE HCL 5 MG PO TABS
5.0000 mg | ORAL_TABLET | ORAL | Status: DC | PRN
Start: 2020-08-26 — End: 2020-08-31
  Administered 2020-08-28 – 2020-08-29 (×2): 5 mg via ORAL
  Filled 2020-08-26 (×2): qty 1

## 2020-08-26 MED ORDER — VANCOMYCIN HCL 1000 MG IV SOLR
INTRAVENOUS | Status: AC
  Filled 2020-08-26: qty 3000

## 2020-08-26 MED ORDER — DEXTROSE 50 % IV SOLN: 0.0000 mL | INTRAVENOUS | Status: DC | PRN

## 2020-08-26 MED ORDER — ASPIRIN EC 325 MG PO TBEC
325.0000 mg | DELAYED_RELEASE_TABLET | Freq: Every day | ORAL | Status: DC
  Administered 2020-08-27 – 2020-08-29 (×3): 325 mg via ORAL
  Filled 2020-08-26 (×3): qty 1

## 2020-08-26 MED ORDER — VANCOMYCIN HCL 1000 MG IV SOLR
INTRAVENOUS | Status: DC | PRN
  Administered 2020-08-26: 3 g

## 2020-08-26 MED ORDER — ROCURONIUM BROMIDE 10 MG/ML (PF) SYRINGE
PREFILLED_SYRINGE | INTRAVENOUS | Status: AC
  Filled 2020-08-26: qty 10

## 2020-08-26 MED FILL — Heparin Sodium (Porcine) Inj 1000 Unit/ML: INTRAMUSCULAR | Qty: 30 | Status: AC

## 2020-08-26 MED FILL — Potassium Chloride Inj 2 mEq/ML: INTRAVENOUS | Qty: 40 | Status: AC

## 2020-08-26 MED FILL — Magnesium Sulfate Inj 50%: INTRAMUSCULAR | Qty: 10 | Status: AC

## 2020-08-26 SURGICAL SUPPLY — 89 items
ADH SKN CLS APL DERMABOND .7 (GAUZE/BANDAGES/DRESSINGS) ×3
APL SRG 7X2 LUM MLBL SLNT (VASCULAR PRODUCTS)
APPLICATOR TIP COSEAL (VASCULAR PRODUCTS) IMPLANT
BAG DECANTER FOR FLEXI CONT (MISCELLANEOUS) ×4 IMPLANT
BLADE CLIPPER SURG (BLADE) ×4 IMPLANT
BLADE STERNUM SYSTEM 6 (BLADE) ×4 IMPLANT
BLADE SURG 11 STRL SS (BLADE) ×1 IMPLANT
BNDG ELASTIC 4X5.8 VLCR STR LF (GAUZE/BANDAGES/DRESSINGS) ×4 IMPLANT
BNDG ELASTIC 6X5.8 VLCR STR LF (GAUZE/BANDAGES/DRESSINGS) ×4 IMPLANT
BNDG GAUZE ELAST 4 BULKY (GAUZE/BANDAGES/DRESSINGS) ×4 IMPLANT
CANISTER SUCT 3000ML PPV (MISCELLANEOUS) ×4 IMPLANT
CANNULA NON VENT 20FR 12 (CANNULA) ×1 IMPLANT
CATH COUDE FOLEY 2W 5CC 16FR (CATHETERS) ×1 IMPLANT
CATH COUDE FOLEY 5CC 14FR (CATHETERS) ×1 IMPLANT
CATH CPB KIT HENDRICKSON (MISCELLANEOUS) ×4 IMPLANT
CATH FOLEY 2W COUNCIL 5CC 16FR (CATHETERS) ×1 IMPLANT
CATH ROBINSON RED A/P 12FR (CATHETERS) ×1 IMPLANT
CATH ROBINSON RED A/P 18FR (CATHETERS) ×8 IMPLANT
CLIP VESOCCLUDE SM WIDE 24/CT (CLIP) ×12 IMPLANT
CONTAINER PROTECT SURGISLUSH (MISCELLANEOUS) ×5 IMPLANT
DERMABOND ADVANCED (GAUZE/BANDAGES/DRESSINGS) ×1
DERMABOND ADVANCED .7 DNX12 (GAUZE/BANDAGES/DRESSINGS) ×3 IMPLANT
DRAIN CHANNEL 28F RND 3/8 FF (WOUND CARE) ×12 IMPLANT
DRAPE CARDIOVASCULAR INCISE (DRAPES) ×4
DRAPE SRG 135X102X78XABS (DRAPES) ×3 IMPLANT
DRAPE WARM FLUID 44X44 (DRAPES) ×4 IMPLANT
DRSG AQUACEL AG ADV 3.5X14 (GAUZE/BANDAGES/DRESSINGS) ×4 IMPLANT
ELECT CAUTERY BLADE 6.4 (BLADE) ×4 IMPLANT
ELECT REM PT RETURN 9FT ADLT (ELECTROSURGICAL) ×8
ELECTRODE REM PT RTRN 9FT ADLT (ELECTROSURGICAL) ×6 IMPLANT
FELT TEFLON 1X6 (MISCELLANEOUS) ×7 IMPLANT
GAUZE SPONGE 4X4 12PLY STRL (GAUZE/BANDAGES/DRESSINGS) ×8 IMPLANT
GAUZE SPONGE 4X4 12PLY STRL LF (GAUZE/BANDAGES/DRESSINGS) ×2 IMPLANT
GOWN STRL REUS W/ TWL LRG LVL3 (GOWN DISPOSABLE) ×12 IMPLANT
GOWN STRL REUS W/TWL LRG LVL3 (GOWN DISPOSABLE) ×16
GUIDEWIRE ANG ZIPWIRE 038X150 (WIRE) ×1 IMPLANT
GUIDEWIRE STR DUAL SENSOR (WIRE) ×1 IMPLANT
INSERT FOGARTY XLG (MISCELLANEOUS) ×4 IMPLANT
INSERT SUTURE HOLDER (MISCELLANEOUS) ×4 IMPLANT
KIT APPLICATOR RATIO 11:1 (KITS) ×1 IMPLANT
KIT BASIN OR (CUSTOM PROCEDURE TRAY) ×4 IMPLANT
KIT SUCTION CATH 14FR (SUCTIONS) ×4 IMPLANT
KIT TURNOVER KIT B (KITS) ×4 IMPLANT
KIT VASOVIEW HEMOPRO 2 VH 4000 (KITS) ×4 IMPLANT
NDL 18GX1X1/2 (RX/OR ONLY) (NEEDLE) ×3 IMPLANT
NEEDLE 18GX1X1/2 (RX/OR ONLY) (NEEDLE) ×4 IMPLANT
NS IRRIG 1000ML POUR BTL (IV SOLUTION) ×20 IMPLANT
PACK E OPEN HEART (SUTURE) ×4 IMPLANT
PACK OPEN HEART (CUSTOM PROCEDURE TRAY) ×4 IMPLANT
PACK PLATELET PROCEDURE 60 (MISCELLANEOUS) ×1 IMPLANT
PACK SPY-PHI (KITS) ×1 IMPLANT
PAD ARMBOARD 7.5X6 YLW CONV (MISCELLANEOUS) ×8 IMPLANT
PAD ELECT DEFIB RADIOL ZOLL (MISCELLANEOUS) ×4 IMPLANT
PENCIL BUTTON HOLSTER BLD 10FT (ELECTRODE) ×4 IMPLANT
POSITIONER HEAD DONUT 9IN (MISCELLANEOUS) ×4 IMPLANT
POWDER SURGICEL 3.0 GRAM (HEMOSTASIS) ×4 IMPLANT
PUMP SARN DELFIN (MISCELLANEOUS) ×1 IMPLANT
PUNCH AORTIC ROTATE 4.0MM (MISCELLANEOUS) ×1 IMPLANT
SEALANT SURG COSEAL 8ML (VASCULAR PRODUCTS) ×1 IMPLANT
SET CYSTO W/LG BORE CLAMP LF (SET/KITS/TRAYS/PACK) ×1 IMPLANT
SET MPS 3-ND DEL (MISCELLANEOUS) ×1 IMPLANT
SUT BONE WAX W31G (SUTURE) ×4 IMPLANT
SUT MNCRL AB 3-0 PS2 18 (SUTURE) ×8 IMPLANT
SUT MNCRL AB 4-0 PS2 18 (SUTURE) ×1 IMPLANT
SUT PDS AB 1 CTX 36 (SUTURE) ×8 IMPLANT
SUT PROLENE 3 0 SH DA (SUTURE) ×4 IMPLANT
SUT PROLENE 5 0 C 1 36 (SUTURE) IMPLANT
SUT PROLENE 6 0 C 1 30 (SUTURE) ×12 IMPLANT
SUT PROLENE 8 0 BV175 6 (SUTURE) ×2 IMPLANT
SUT PROLENE BLUE 7 0 (SUTURE) ×4 IMPLANT
SUT SILK 2 0 SH CR/8 (SUTURE) IMPLANT
SUT SILK 3 0 SH CR/8 (SUTURE) IMPLANT
SUT STEEL 6MS V (SUTURE) ×4 IMPLANT
SUT STEEL SZ 6 DBL 3X14 BALL (SUTURE) ×4 IMPLANT
SUT VIC AB 2-0 CT1 27 (SUTURE) ×4
SUT VIC AB 2-0 CT1 TAPERPNT 27 (SUTURE) IMPLANT
SUT VIC AB 3-0 X1 27 (SUTURE) IMPLANT
SYR 30ML LL (SYRINGE) ×4 IMPLANT
SYSTEM SAHARA CHEST DRAIN ATS (WOUND CARE) ×4 IMPLANT
TAPE CLOTH SURG 4X10 WHT LF (GAUZE/BANDAGES/DRESSINGS) ×1 IMPLANT
TAPE PAPER 2X10 WHT MICROPORE (GAUZE/BANDAGES/DRESSINGS) ×1 IMPLANT
TIP DUAL SPRAY TOPICAL (TIP) ×2 IMPLANT
TOWEL GREEN STERILE (TOWEL DISPOSABLE) ×4 IMPLANT
TOWEL GREEN STERILE FF (TOWEL DISPOSABLE) ×4 IMPLANT
TRAY FOLEY SLVR 16FR TEMP STAT (SET/KITS/TRAYS/PACK) ×4 IMPLANT
TUBING INSUFF HIGH FLOW RTP (TUBING) ×1 IMPLANT
TUBING LAP HI FLOW INSUFFLATIO (TUBING) ×4 IMPLANT
UNDERPAD 30X36 HEAVY ABSORB (UNDERPADS AND DIAPERS) ×4 IMPLANT
WATER STERILE IRR 1000ML POUR (IV SOLUTION) ×8 IMPLANT

## 2020-08-26 NOTE — Anesthesia Postprocedure Evaluation (Signed)
Anesthesia Post Note  Patient: Tony Shaffer  Procedure(s) Performed: CORONARY ARTERY BYPASS GRAFTING (CABG), ON PUMP, TIMES FOUR, USING LEFT INTERNAL MAMMARY ARTERY AND RIGHT ENDOSCOPICALLY HARVESTED GREATER SAPHENOUS VEIN (Chest) TRANSESOPHAGEAL ECHOCARDIOGRAM (TEE) INDOCYANINE Karson Chicas FLUORESCENCE IMAGING (ICG) ENDOVEIN HARVEST OF GREATER SAPHENOUS VEIN (Right: Leg Upper)     Patient location during evaluation: SICU Anesthesia Type: General Level of consciousness: patient remains intubated per anesthesia plan Pain management: pain level controlled Vital Signs Assessment: post-procedure vital signs reviewed and stable Respiratory status: patient remains intubated per anesthesia plan Cardiovascular status: stable Postop Assessment: no apparent nausea or vomiting Anesthetic complications: no   No notable events documented.  Last Vitals:  Vitals:   08/26/20 1744 08/26/20 1745  BP:  126/67  Pulse: 90 90  Resp: (!) 27 (!) 31  Temp: 37.9 C 37.9 C  SpO2: 96% 96%    Last Pain:  Vitals:   08/26/20 1744  TempSrc:   PainSc: 9                  Sanay Belmar

## 2020-08-26 NOTE — Plan of Care (Signed)
  Problem: Activity: Goal: Risk for activity intolerance will decrease Outcome: Completed/Met   Problem: Nutrition: Goal: Adequate nutrition will be maintained Outcome: Completed/Met   Problem: Coping: Goal: Level of anxiety will decrease Outcome: Completed/Met   Problem: Elimination: Goal: Will not experience complications related to bowel motility Outcome: Completed/Met   Problem: Elimination: Goal: Will not experience complications related to urinary retention Outcome: Completed/Met   Problem: Pain Managment: Goal: General experience of comfort will improve Outcome: Completed/Met   Problem: Safety: Goal: Ability to remain free from injury will improve Outcome: Completed/Met   Problem: Skin Integrity: Goal: Risk for impaired skin integrity will decrease Outcome: Completed/Met

## 2020-08-26 NOTE — Anesthesia Procedure Notes (Signed)
Central Venous Catheter Insertion Performed by: anesthesiologist Start/End6/12/2020 6:55 AM, 08/26/2020 7:15 AM Patient location: Pre-op. Preanesthetic checklist: patient identified, IV checked, site marked, risks and benefits discussed, surgical consent, monitors and equipment checked, pre-op evaluation, timeout performed and anesthesia consent Lidocaine 1% used for infiltration and patient sedated Hand hygiene performed  and maximum sterile barriers used  Catheter size: 8.5 Fr Sheath introducer Procedure performed using ultrasound guided technique. Ultrasound Notes:anatomy identified, needle tip was noted to be adjacent to the nerve/plexus identified, no ultrasound evidence of intravascular and/or intraneural injection and image(s) printed for medical record Attempts: 1 Following insertion, line sutured and dressing applied. Post procedure assessment: blood return through all ports, free fluid flow and no air  Patient tolerated the procedure well with no immediate complications.

## 2020-08-26 NOTE — Progress Notes (Signed)
Orvan Seen MD repositioned Swan. Much better waveform and more appropriate numbers. Will utilize it rather than hooking up Flotrak.

## 2020-08-26 NOTE — Brief Op Note (Addendum)
08/23/2020 - 08/26/2020  11:38 AM  PATIENT:  Tony Shaffer  70 y.o. male  PRE-OPERATIVE DIAGNOSIS:  Coronary Artery Disease  POST-OPERATIVE DIAGNOSIS:  Coronary Artery Disease  PROCEDURE:  TRANSESOPHAGEAL ECHOCARDIOGRAM (TEE), CORONARY ARTERY BYPASS GRAFTING (CABG), ON PUMP, TIMES FOUR, (LIMA to LAD, SVG SEQUENTIALLY to DIAGONAL and OM, SVG to PDA) USING LEFT INTERNAL MAMMARY ARTERY AND RIGHT ENDOSCOPICALLY HARVESTED GREATER SAPHENOUS VEIN, and INDOCYANINE GREEN FLUORESCENCE IMAGING (ICG)  RIGHT EVH HARVEST TIME: 34 minutes; RIGHT EVH PREP TIME: 14 minutes  SURGEON:  Surgeon(s) and Role:    Wonda Olds, MD - Primary    McKenzie, Candee Furbish, MD  PHYSICIAN ASSISTANT: Lars Pinks PA-C  ASSISTANTS: Ara Kussmaul RNFA   ANESTHESIA:   general  EBL:  Per anesthesia, perfusion record   DRAINS:  Chest tubes placed in the mediastinal and pleural spaces    LOCAL MEDICATIONS USED:  OTHER Exparel  COUNTS CORRECT:  YES  DICTATION: .Dragon Dictation  PLAN OF CARE: Admit to inpatient   PATIENT DISPOSITION:  ICU - intubated and hemodynamically stable.   Delay start of Pharmacological VTE agent (>24hrs) due to surgical blood loss or risk of bleeding: yes  BASELINE WEIGHT: 75.4 kg Agee with documentation; it should be noted that intra-operative urology was required to place foley catheter due to patient's prior prostate surgeries. This catheter should be left in place for at least one week after surgery Kunal Levario Z. Orvan Seen, [place]

## 2020-08-26 NOTE — Progress Notes (Signed)
Patient ID: Tony Shaffer, male   DOB: [birth year], 70 y.o.   MRN: [id number]    Subjective:  Seen prior to going to OR No chest pain    Objective:  Vitals:   08/26/20 0724 08/26/20 0725 08/26/20 0726 08/26/20 0727  BP:    (!) 145/78  Pulse: 75 70 72 75  Resp: 19 15 17 19   Temp:      TempSrc:      SpO2: 100% 100% 100% 100%  Weight:      Height:        Intake/Output from previous day:  Intake/Output Summary (Last 24 hours) at 08/26/2020 9735 Last data filed at 08/26/2020 0500 Gross per 24 hour  Intake 1112.85 ml  Output 1200 ml  Net -87.15 ml    Physical Exam: Affect appropriate Healthy:  appears stated age HEENT: normal Neck supple with no adenopathy JVP normal no bruits no thyromegaly Lungs clear with no wheezing and good diaphragmatic motion Heart:  S1/S2 no murmur, no rub, gallop or click PMI normal Abdomen: benighn, BS positve, no tenderness, no AAA no bruit.  No HSM or HJR Distal pulses intact with no bruits No edema Neuro non-focal Skin warm and dry No muscular weakness   Lab Results: Basic Metabolic Panel: Recent Labs    08/25/20 0824 08/26/20 0317 08/26/20 0802  NA 135 134* 137  K 4.0 3.9 3.8  CL 100 101 102  CO2 25 25  --   GLUCOSE 139* 110* 114*  BUN 13 14 13   CREATININE 1.11 1.17 0.90  CALCIUM 9.0 9.1  --    Liver Function Tests: Recent Labs    08/23/20 2110  AST 22  ALT 32  ALKPHOS 56  BILITOT 0.5  PROT 7.5  ALBUMIN 4.3   No results for input(s): LIPASE, AMYLASE in the last 72 hours. CBC: Recent Labs    08/23/20 2110 08/24/20 0500 08/25/20 0824 08/26/20 0317 08/26/20 0802  WBC 7.2   < > 6.9 9.1  --   NEUTROABS 4.4  --   --   --   --   HGB 13.8   < > 14.4 13.6 12.9*  HCT 43.1   < > 45.1 42.6 38.0*  MCV 88.1   < > 88.3 87.8  --   PLT 279   < > 283 267  --    < > = values in this interval not displayed.   Cardiac Enzymes: No results for input(s): CKTOTAL, CKMB, CKMBINDEX, TROPONINI in the last 72 hours. BNP: Invalid  input(s): POCBNP D-Dimer: Recent Labs    08/23/20 2110  DDIMER <0.27   Hemoglobin A1C: No results for input(s): HGBA1C in the last 72 hours. Fasting Lipid Panel: Recent Labs    08/24/20 2022  CHOL 201*  HDL 53  LDLCALC 122*  TRIG 131  CHOLHDL 3.8   Thyroid Function Tests: No results for input(s): TSH, T4TOTAL, T3FREE, THYROIDAB in the last 72 hours.  Invalid input(s): FREET3 Anemia Panel: No results for input(s): VITAMINB12, FOLATE, FERRITIN, TIBC, IRON, RETICCTPCT in the last 72 hours.  Imaging: CARDIAC CATHETERIZATION  Result Date: 08/24/2020  Ost Cx to Prox Cx lesion is 40% stenosed.  Dist Cx lesion is 40% stenosed.  Ost LM to Mid LM lesion is 50% stenosed.  Ost LAD to Prox LAD lesion is 99% stenosed.  The left ventricular systolic function is normal.  LV end diastolic pressure is normal.  The left ventricular ejection fraction is greater than 65% by visual estimate.  There  is no mitral valve regurgitation.  1. Moderate distal left main stenosis 2. Severe, heavily calcified ostial/proximal LAD stenosis. This is a functional chronic occlusion. The LAD also fills from right to left collaterals through a small, early branch. 3. Mild disease in the dominant Circumflex 4. Small non-dominant RCA 5. Normal LV systolic function Recommendations: We will ask  CT surgery to see him tomorrow to review candidacy for bypass surgery. I think CABG is the best option for revascularization. I have reviewed the films with Dr. Fletcher Anon as well. Continue ASA and statin. Will restart IV heparin 6 hours post sheath pull.   ECHOCARDIOGRAM COMPLETE  Result Date: 08/25/2020    ECHOCARDIOGRAM REPORT   Patient Name:   Tony Shaffer Date of Exam: 08/25/2020 Medical Rec #:  [id number]       Height:       69.0 in Accession #:    [id number]      Weight:       166.8 lb Date of Birth:  08-Oct-1950        BSA:          1.913 m Patient Age:    43 years        BP:           155/102 mmHg Patient Gender: M                HR:           76 bpm. Exam Location:  Inpatient Procedure: Color Doppler, Cardiac Doppler, 2D Echo and Intracardiac            Opacification Agent Indications:    CAD  History:        Patient has prior history of Echocardiogram examinations, most                 recent 12/11/2019. CAD, Arrythmias:RBBB, Signs/Symptoms:Chest                 Pain; Risk Factors:Hypertension.  Sonographer:    Merrie Roof RDCS Referring Phys: [id number] [place]  1. Left ventricular ejection fraction, by estimation, is 55 to 60%. The left ventricle has normal function. The left ventricle demonstrates regional wall motion abnormalities with apical hypokinesis. Left ventricular diastolic parameters are consistent with Grade I diastolic dysfunction (impaired relaxation).  2. Right ventricular systolic function is normal. The right ventricular size is normal. Tricuspid regurgitation signal is inadequate for assessing PA pressure.  3. The mitral valve is normal in structure. No evidence of mitral valve regurgitation. No evidence of mitral stenosis.  4. The aortic valve is tricuspid. Aortic valve regurgitation is not visualized. Mild aortic valve sclerosis is present, with no evidence of aortic valve stenosis.  5. The IVC was not visualized. FINDINGS  Left Ventricle: Left ventricular ejection fraction, by estimation, is 55 to 60%. The left ventricle has normal function. The left ventricle demonstrates regional wall motion abnormalities. The left ventricular internal cavity size was normal in size. There is no left ventricular hypertrophy. Left ventricular diastolic parameters are consistent with Grade I diastolic dysfunction (impaired relaxation). Right Ventricle: The right ventricular size is normal. No increase in right ventricular wall thickness. Right ventricular systolic function is normal. Tricuspid regurgitation signal is inadequate for assessing PA pressure. Left Atrium: Left atrial size was normal in size. Right  Atrium: Right atrial size was normal in size. Pericardium: There is no evidence of pericardial effusion. Mitral Valve: The mitral valve is normal in structure. No evidence of mitral  valve regurgitation. No evidence of mitral valve stenosis. Tricuspid Valve: The tricuspid valve is normal in structure. Tricuspid valve regurgitation is not demonstrated. Aortic Valve: The aortic valve is tricuspid. Aortic valve regurgitation is not visualized. Mild aortic valve sclerosis is present, with no evidence of aortic valve stenosis. Aortic valve mean gradient measures 2.0 mmHg. Aortic valve peak gradient measures 4.4 mmHg. Aortic valve area, by VTI measures 2.78 cm. Pulmonic Valve: The pulmonic valve was normal in structure. Pulmonic valve regurgitation is not visualized. Aorta: The aortic root is normal in size and structure. IAS/Shunts: No atrial level shunt detected by color flow Doppler.  LEFT VENTRICLE PLAX 2D LVIDd:         5.20 cm  Diastology LVIDs:         2.90 cm  LV e' medial:    6.64 cm/s LV PW:         0.70 cm  LV E/e' medial:  6.2 LV IVS:        0.70 cm  LV e' lateral:   7.94 cm/s LVOT diam:     2.00 cm  LV E/e' lateral: 5.2 LV SV:         54 LV SV Index:   28 LVOT Area:     3.14 cm  RIGHT VENTRICLE RV Basal diam:  2.50 cm RV Mid diam:    1.90 cm RV S prime:     11.70 cm/s TAPSE (M-mode): 2.2 cm LEFT ATRIUM           Index       RIGHT ATRIUM           Index LA diam:      3.00 cm 1.57 cm/m  RA Area:     10.60 cm LA Vol (A2C): 39.7 ml 20.76 ml/m RA Volume:   19.00 ml  9.93 ml/m LA Vol (A4C): 11.3 ml 5.91 ml/m  AORTIC VALVE AV Area (Vmax):    2.61 cm AV Area (Vmean):   2.55 cm AV Area (VTI):     2.78 cm AV Vmax:           105.00 cm/s AV Vmean:          67.200 cm/s AV VTI:            0.193 m AV Peak Grad:      4.4 mmHg AV Mean Grad:      2.0 mmHg LVOT Vmax:         87.20 cm/s LVOT Vmean:        54.500 cm/s LVOT VTI:          0.171 m LVOT/AV VTI ratio: 0.89  AORTA Ao Root diam: 3.10 cm Ao Asc diam:  3.20 cm  MITRAL VALVE MV Area (PHT): 2.36 cm    SHUNTS MV Decel Time: 321 msec    Systemic VTI:  0.17 m MV E velocity: 41.10 cm/s  Systemic Diam: 2.00 cm MV A velocity: 78.00 cm/s MV E/A ratio:  0.53 Loralie Champagne MD Electronically signed by Loralie Champagne MD Signature Date/Time: 08/25/2020/4:51:58 PM    Final    VAS US DOPPLER PRE CABG  Result Date: 08/25/2020 PREOPERATIVE VASCULAR EVALUATION Patient Name:  Tony Shaffer  Date of Exam:   08/25/2020 Medical Rec #: [id number]        Accession #:    [id number] Date of Birth: 1950-06-04         Patient Gender: M Patient Age:   70Y Exam Location:  [facility] Procedure:  VAS US DOPPLER PRE CABG Referring Phys: [id number] BROADUS Z ATKINS --------------------------------------------------------------------------------  Indications:      Pre-CABG. Risk Factors:     Hypertension. Comparison Study: No prior study Performing Technologist: Maudry Mayhew MHA, RVT, RDCS, RDMS  Examination Guidelines: A complete evaluation includes B-mode imaging, spectral Doppler, color Doppler, and power Doppler as needed of all accessible portions of each vessel. Bilateral testing is considered an integral part of a complete examination. Limited examinations for reoccurring indications may be performed as noted.  Right Carotid Findings: +----------+-------+--------+--------+-----------------------+-----------------+           PSV    EDV cm/sStenosisDescribe               Comments                    cm/s                                                            +----------+-------+--------+--------+-----------------------+-----------------+ CCA Prox  88     13                                                       +----------+-------+--------+--------+-----------------------+-----------------+ CCA Distal89     18                                     intimal                                                                   thickening         +----------+-------+--------+--------+-----------------------+-----------------+ ICA Prox  67     18              calcific and                                                              heterogenous                             +----------+-------+--------+--------+-----------------------+-----------------+ ICA Distal83     26                                                       +----------+-------+--------+--------+-----------------------+-----------------+ ECA       93     8                                                        +----------+-------+--------+--------+-----------------------+-----------------+  Portions of this table do not appear on this page. +----------+--------+-------+----------------+------------+           PSV cm/sEDV cmsDescribe        Arm Pressure +----------+--------+-------+----------------+------------+ Subclavian170            Multiphasic, WNL             +----------+--------+-------+----------------+------------+ +---------+--------+--+--------+--+---------+ VertebralPSV cm/s55EDV cm/s13Antegrade +---------+--------+--+--------+--+---------+ Left Carotid Findings: +----------+--------+--------+--------+----------------------+--------+           PSV cm/sEDV cm/sStenosisDescribe              Comments +----------+--------+--------+--------+----------------------+--------+ CCA Prox  76      14                                             +----------+--------+--------+--------+----------------------+--------+ CCA Distal99      19                                             +----------+--------+--------+--------+----------------------+--------+ ICA Prox  110     19              heterogenous and focal         +----------+--------+--------+--------+----------------------+--------+ ICA Distal88      32                                              +----------+--------+--------+--------+----------------------+--------+ ECA       72      10                                             +----------+--------+--------+--------+----------------------+--------+ +----------+--------+--------+----------------+------------+ SubclavianPSV cm/sEDV cm/sDescribe        Arm Pressure +----------+--------+--------+----------------+------------+           112             Multiphasic, WNL             +----------+--------+--------+----------------+------------+ +---------+--------+--+--------+-+---------+ VertebralPSV cm/s28EDV cm/s8Antegrade +---------+--------+--+--------+-+---------+  ABI Findings: +--------+------------------+-----+---------+--------+ Right   Rt Pressure (mmHg)IndexWaveform Comment  +--------+------------------+-----+---------+--------+ [id number]                    triphasic         +--------+------------------+-----+---------+--------+ PTA                            triphasic         +--------+------------------+-----+---------+--------+ DP                             biphasic          +--------+------------------+-----+---------+--------+ +--------+------------------+-----+---------+-------+ Left    Lt Pressure (mmHg)IndexWaveform Comment +--------+------------------+-----+---------+-------+ [id number]                    triphasic        +--------+------------------+-----+---------+-------+ PTA                            triphasic        +--------+------------------+-----+---------+-------+  DP                             biphasic         +--------+------------------+-----+---------+-------+  Right Doppler Findings: +-----------+--------+-----+---------+--------------------+ Site       PressureIndexDoppler  Comments             +-----------+--------+-----+---------+--------------------+ Brachial   202          triphasic                      +-----------+--------+-----+---------+--------------------+ Radial                  triphasic                     +-----------+--------+-----+---------+--------------------+ Ulnar                   triphasic                     +-----------+--------+-----+---------+--------------------+ Palmar Arch                      Within normal limits +-----------+--------+-----+---------+--------------------+  Left Doppler Findings: +-----------+--------+-----+---------+-----------------------------------------+ Site       PressureIndexDoppler  Comments                                  +-----------+--------+-----+---------+-----------------------------------------+ Brachial   206          triphasic                                          +-----------+--------+-----+---------+-----------------------------------------+ Radial                  triphasic                                          +-----------+--------+-----+---------+-----------------------------------------+ Ulnar                   triphasic                                          +-----------+--------+-----+---------+-----------------------------------------+ Palmar Arch                      Signal is unaffected with radial                                           compression, decreases >50% with ulnar                                     compression.                              +-----------+--------+-----+---------+-----------------------------------------+  Summary: Right Carotid: Velocities in the right ICA are consistent with a 1-39% stenosis. Left Carotid: Velocities in  the left ICA are consistent with a 1-39% stenosis. Vertebrals:  Bilateral vertebral arteries demonstrate antegrade flow. Subclavians: Normal flow hemodynamics were seen in bilateral subclavian              arteries.  Electronically signed by Monica Martinez MD on 08/25/2020 at 7:43:43 PM.    Final     Cardiac Studies:  ECG: SR  no acute ST changes    Telemetry:  NSR 08/26/2020   Echo:   Medications:    [MAR Hold] aspirin EC  81 mg Oral Daily   [MAR Hold] atorvastatin  40 mg Oral Daily   bisacodyl  5 mg Oral Once   epinephrine  0-10 mcg/min Intravenous To OR   [MAR Hold] fluticasone  2 spray Each Nare BID   heparin-papaverine-plasmalyte irrigation   Irrigation To OR   insulin   Intravenous To OR   [MAR Hold] losartan  25 mg Oral Daily   magnesium sulfate  40 mEq Other To OR   [MAR Hold] pantoprazole  40 mg Oral BID   potassium chloride  80 mEq Other To OR   [MAR Hold] sodium chloride flush  3 mL Intravenous Q12H   tranexamic acid  15 mg/kg Intravenous To OR   tranexamic acid  2 mg/kg Intracatheter To OR      [MAR Hold] sodium chloride      ceFAZolin (ANCEF) IV      ceFAZolin (ANCEF) IV     heparin 30,000 units/NS 1000 mL solution for CELLSAVER     heparin Stopped (08/26/20 0615)   milrinone     nitroGLYCERIN     norepinephrine     tranexamic acid (CYKLOKAPRON) infusion ([facility])     vancomycin      Assessment/Plan:   Angina: cath 6/8 with LM disease CABG with Dr Orvan Seen this am first case Pre CABG dopplers no significant carotid plaque   Jenkins Rouge 08/26/2020, 8:38 AM   Patient ID: Tony Shaffer, male   DOB: 09/18/1950, 70 y.o.   MRN: [id number]

## 2020-08-26 NOTE — Transfer of Care (Signed)
Immediate Anesthesia Transfer of Care Note  Patient: Tony Shaffer  Procedure(s) Performed: CORONARY ARTERY BYPASS GRAFTING (CABG), ON PUMP, TIMES FOUR, USING LEFT INTERNAL MAMMARY ARTERY AND RIGHT ENDOSCOPICALLY HARVESTED GREATER SAPHENOUS VEIN (Chest) TRANSESOPHAGEAL ECHOCARDIOGRAM (TEE) INDOCYANINE GREEN FLUORESCENCE IMAGING (ICG) ENDOVEIN HARVEST OF GREATER SAPHENOUS VEIN (Right: Leg Upper)  Patient Location: ICU  Anesthesia Type:General  Level of Consciousness: Patient remains intubated per anesthesia plan  Airway & Oxygen Therapy: Patient placed on Ventilator (see vital sign flow sheet for setting)  Post-op Assessment: Report given to RN and Post -op Vital signs reviewed and stable  Post vital signs: Reviewed  Last Vitals:  Vitals Value Taken Time  BP    Temp 35.1 C 08/26/20 1303  Pulse 92 08/26/20 1303  Resp 16 08/26/20 1303  SpO2 99 % 08/26/20 1303  Vitals shown include unvalidated device data.  Last Pain:  Vitals:   08/26/20 0638  TempSrc: Oral  PainSc:       Patients Stated Pain Goal: 0 (51/88/41 6606)  Complications: No notable events documented.

## 2020-08-26 NOTE — Procedures (Signed)
   CC: Difficult foley placement   HPI: Mr Geter is a 70yo who is currently undergoing Coronary artery bypass grafting and the nursing staff was unable to place a foley catheter.  Blood pressure 115/65, pulse 93, temperature (!) 101.12 F (38.4 C), resp. rate (!) 27, height 5\' 9"  (1.753 m), weight 75.4 kg, SpO2 95 %. NED. A&Ox3.   No respiratory distress   Abd soft, NT, ND Normal phallus with bilateral descended testicles  Cystoscopy Procedure Note  Patient identification was confirmed, and patient was prepped using Betadine solution.       Pre-Procedure: - Inspection reveals a normal caliber ureteral meatus.  Procedure: The flexible cystoscope was introduced without difficulty - No urethral strictures/lesions are present. -  Enlarged  prostate with previous TURP defect. Large false passage at bladder neck - Normal bladder neck - Bilateral ureteral orifices identified - Bladder mucosa  reveals no ulcers, tumors, or lesions - No bladder stones - No trabeculation  After passing the cystoscope into the bladder a sensor wire was introduced into the bladder. The cystoscope was removed and an 18 french council foley was placed over the wire. The wire was then removed and the foley balloon was inflated with 10cc water.   Post-Procedure: - Patient tolerated the procedure well  Assessment/ Plan: Traumatic foley placement: The patient foley should in place for 1 week prior to attempting a voiding trial  Nicolette Bang, MD

## 2020-08-26 NOTE — Anesthesia Procedure Notes (Signed)
Central Venous Catheter Insertion Performed by: Belinda Block, MD, anesthesiologist Start/End6/12/2020 6:55 AM, 08/26/2020 7:15 AM Patient location: Pre-op. Preanesthetic checklist: patient identified, IV checked, site marked, risks and benefits discussed, surgical consent, monitors and equipment checked, pre-op evaluation, timeout performed and anesthesia consent Position: Trendelenburg Lidocaine 1% used for infiltration and patient sedated Hand hygiene performed , maximum sterile barriers used  and Seldinger technique used PA cath was placed.Sheath introducer Swan type:thermodilution Procedure performed without using ultrasound guided technique. Ultrasound Notes:anatomy identified and needle tip was noted to be adjacent to the nerve/plexus identified Attempts: 1 Following insertion, line sutured, dressing applied and Biopatch. Post procedure assessment: blood return through all ports  Patient tolerated the procedure well with no immediate complications.

## 2020-08-26 NOTE — Progress Notes (Signed)
      [address]       [place] [address]             [phone]      S/p CABG x 4  Recently extubated  BP 126/67   Pulse 90   Temp 100.22 F (37.9 C)   Resp (!) 31   Ht 5\' 9"  (1.753 m)   Wt 75.4 kg   SpO2 96%   BMI 24.54 kg/m   Intake/Output Summary (Last 24 hours) at 08/26/2020 1749 Last data filed at 08/26/2020 1700 Gross per 24 hour  Intake 6296.41 ml  Output 7975 ml  Net -1678.59 ml   Blood tinged urine Minimal CT output Hct = 29 Stable early postop  Oreatha Fabry C. Roxan Hockey, MD [facility] [phone]

## 2020-08-26 NOTE — Anesthesia Procedure Notes (Addendum)
Procedure Name: Intubation Date/Time: 08/26/2020 7:56 AM Performed by: Sherlie Ban, RN Pre-anesthesia Checklist: Patient identified, Emergency Drugs available, Suction available and Patient being monitored Patient Re-evaluated:Patient Re-evaluated prior to induction Oxygen Delivery Method: Circle system utilized Preoxygenation: Pre-oxygenation with 100% oxygen Induction Type: IV induction Ventilation: Mask ventilation without difficulty and Oral airway inserted - appropriate to patient size Laryngoscope Size: Mac and 4 Grade View: Grade I Tube type: Oral Tube size: 8.0 mm Number of attempts: 1 Airway Equipment and Method: Stylet and Oral airway Placement Confirmation: ETT inserted through vocal cords under direct vision, positive ETCO2 and breath sounds checked- equal and bilateral Secured at: 23 cm Tube secured with: Tape Dental Injury: Teeth and Oropharynx as per pre-operative assessment  Comments: Intubation by [facility]

## 2020-08-26 NOTE — H&P (Signed)
History and Physical Interval Note:  08/26/2020 7:24 AM  Tony Shaffer  has presented today for surgery, with the diagnosis of Coronary Artery Disease.  The various methods of treatment have been discussed with the patient and family. After consideration of risks, benefits and other options for treatment, the patient has consented to  Procedure(s): CORONARY ARTERY BYPASS GRAFTING (CABG) (N/A) TRANSESOPHAGEAL ECHOCARDIOGRAM (TEE) (N/A) INDOCYANINE GREEN FLUORESCENCE IMAGING (ICG) (N/A) as a surgical intervention.  The patient's history has been reviewed, patient examined, no change in status, stable for surgery.  I have reviewed the patient's chart and labs.  Questions were answered to the patient's satisfaction.     Wonda Olds

## 2020-08-26 NOTE — Anesthesia Procedure Notes (Addendum)
Arterial Line Insertion Start/End6/12/2020 7:50 AM, 08/26/2020 7:55 AM Performed by: Janene Harvey, CRNA, CRNA  Patient location: Pre-op. Preanesthetic checklist: patient identified, IV checked, risks and benefits discussed, surgical consent, monitors and equipment checked, pre-op evaluation and anesthesia consent Lidocaine 1% used for infiltration Left, radial was placed Catheter size: 20 Fr Hand hygiene performed  and maximum sterile barriers used   Attempts: 1 Procedure performed without using ultrasound guided technique. Following insertion, dressing applied and Biopatch. Post procedure assessment: normal and unchanged  Patient tolerated the procedure well with no immediate complications. Additional procedure comments: Placed by C. Ernestine Conrad, [place].Marland Kitchen

## 2020-08-26 NOTE — Progress Notes (Signed)
  Echocardiogram Echocardiogram Transesophageal has been performed.  Tony Shaffer 08/26/2020, 9:16 AM

## 2020-08-26 NOTE — Progress Notes (Signed)
Paged Atkins MD in regards to giving all 4 albumin and 500 LR bolus, ABG results, and about the Swan being coiled in the RA. Orders received to give 6 albumin total, 2 amps of bicarb, and to remove swan and hook up Flotrak.

## 2020-08-26 NOTE — Procedures (Signed)
Extubation Procedure Note  Patient Details:   Name: Tony Shaffer DOB: Nov 11, 1950 MRN: [id number]   Airway Documentation:    Vent end date: 08/26/20 Vent end time: 1730   Evaluation  O2 sats: stable throughout Complications: No apparent complications Patient did tolerate procedure well. Bilateral Breath Sounds: Clear, Diminished   Pt extubated to 4L [place] per rapid wean protocol. NIF was -20 and VC was 658ml. Pt had positive cuff leak prior to extubation. No stridor noted. Pt able to voice his name.   Vilinda Blanks 08/26/2020, 5:32 PM

## 2020-08-27 ENCOUNTER — Inpatient Hospital Stay (HOSPITAL_COMMUNITY): Payer: Medicare HMO

## 2020-08-27 LAB — CBC
HCT: 24.1 % — ABNORMAL LOW (ref 39.0–52.0)
HCT: 24.5 % — ABNORMAL LOW (ref 39.0–52.0)
Hemoglobin: 7.6 g/dL — ABNORMAL LOW (ref 13.0–17.0)
Hemoglobin: 7.6 g/dL — ABNORMAL LOW (ref 13.0–17.0)
MCH: 28.5 pg (ref 26.0–34.0)
MCH: 28.1 pg (ref 26.0–34.0)
MCHC: 31.5 g/dL (ref 30.0–36.0)
MCHC: 31 g/dL (ref 30.0–36.0)
MCV: 90.3 fL (ref 80.0–100.0)
MCV: 90.7 fL (ref 80.0–100.0)
Platelets: 125 10*3/uL — ABNORMAL LOW (ref 150–400)
Platelets: 141 10*3/uL — ABNORMAL LOW (ref 150–400)
RBC: 2.67 MIL/uL — ABNORMAL LOW (ref 4.22–5.81)
RBC: 2.7 MIL/uL — ABNORMAL LOW (ref 4.22–5.81)
RDW: 13.3 % (ref 11.5–15.5)
RDW: 13.5 % (ref 11.5–15.5)
WBC: 7.7 10*3/uL (ref 4.0–10.5)
WBC: 10 10*3/uL (ref 4.0–10.5)
nRBC: 0 % (ref 0.0–0.2)
nRBC: 0 % (ref 0.0–0.2)

## 2020-08-27 LAB — BASIC METABOLIC PANEL
Anion gap: 6 (ref 5–15)
Anion gap: 7 (ref 5–15)
BUN: 9 mg/dL (ref 8–23)
BUN: 9 mg/dL (ref 8–23)
CO2: 23 mmol/L (ref 22–32)
CO2: 24 mmol/L (ref 22–32)
Calcium: 7.4 mg/dL — ABNORMAL LOW (ref 8.9–10.3)
Calcium: 7.9 mg/dL — ABNORMAL LOW (ref 8.9–10.3)
Chloride: 109 mmol/L (ref 98–111)
Chloride: 107 mmol/L (ref 98–111)
Creatinine, Ser: 1.05 mg/dL (ref 0.61–1.24)
Creatinine, Ser: 1.07 mg/dL (ref 0.61–1.24)
GFR, Estimated: >60 mL/min (ref >60)
GFR, Estimated: >60 mL/min (ref >60)
Glucose, Bld: 127 mg/dL — ABNORMAL HIGH (ref 70–99)
Glucose, Bld: 132 mg/dL — ABNORMAL HIGH (ref 70–99)
Potassium: 4 mmol/L (ref 3.5–5.1)
Potassium: 3.9 mmol/L (ref 3.5–5.1)
Sodium: 138 mmol/L (ref 135–145)
Sodium: 138 mmol/L (ref 135–145)

## 2020-08-27 LAB — GLUCOSE, CAPILLARY
Glucose-Capillary: 110 mg/dL — ABNORMAL HIGH (ref 70–99)
Glucose-Capillary: 114 mg/dL — ABNORMAL HIGH (ref 70–99)
Glucose-Capillary: 123 mg/dL — ABNORMAL HIGH (ref 70–99)
Glucose-Capillary: 126 mg/dL — ABNORMAL HIGH (ref 70–99)
Glucose-Capillary: 126 mg/dL — ABNORMAL HIGH (ref 70–99)
Glucose-Capillary: 132 mg/dL — ABNORMAL HIGH (ref 70–99)
Glucose-Capillary: 134 mg/dL — ABNORMAL HIGH (ref 70–99)
Glucose-Capillary: 135 mg/dL — ABNORMAL HIGH (ref 70–99)
Glucose-Capillary: 140 mg/dL — ABNORMAL HIGH (ref 70–99)

## 2020-08-27 LAB — MAGNESIUM
Magnesium: 2.5 mg/dL — ABNORMAL HIGH (ref 1.7–2.4)
Magnesium: 2.4 mg/dL (ref 1.7–2.4)

## 2020-08-27 LAB — ECHO INTRAOPERATIVE TEE
Height: 69 in
Weight: 2659.2 oz

## 2020-08-27 MED ORDER — LEVALBUTEROL HCL 1.25 MG/0.5ML IN NEBU: 1.2500 mg | INHALATION_SOLUTION | Freq: Four times a day (QID) | RESPIRATORY_TRACT | Status: DC | PRN

## 2020-08-27 MED ORDER — METOPROLOL TARTRATE 25 MG PO TABS
25.0000 mg | ORAL_TABLET | Freq: Two times a day (BID) | ORAL | Status: DC
  Administered 2020-08-27 – 2020-08-30 (×8): 25 mg via ORAL
  Filled 2020-08-27 (×8): qty 1

## 2020-08-27 MED ORDER — INSULIN ASPART 100 UNIT/ML IJ SOLN
0.0000 [IU] | INTRAMUSCULAR | Status: DC
  Administered 2020-08-27 (×2): 2 [IU] via SUBCUTANEOUS

## 2020-08-27 MED ORDER — ENOXAPARIN SODIUM 40 MG/0.4ML IJ SOSY
40.0000 mg | PREFILLED_SYRINGE | Freq: Every day | INTRAMUSCULAR | Status: DC
  Administered 2020-08-27 – 2020-08-30 (×4): 40 mg via SUBCUTANEOUS
  Filled 2020-08-27 (×4): qty 0.4

## 2020-08-27 MED ORDER — INSULIN DETEMIR 100 UNIT/ML ~~LOC~~ SOLN
10.0000 [IU] | Freq: Two times a day (BID) | SUBCUTANEOUS | Status: DC
  Administered 2020-08-27 (×2): 10 [IU] via SUBCUTANEOUS
  Filled 2020-08-27 (×4): qty 0.1

## 2020-08-27 NOTE — Progress Notes (Signed)
      [address]       [place] [address]             [phone]      POD # 1 CABG  Up in chair. Pain much better  BP 119/61   Pulse 96   Temp 98.4 F (36.9 C)   Resp (!) 27   Ht 5\' 9"  (1.753 m)   Wt 82.7 kg   SpO2 94%   BMI 26.92 kg/m   Intake/Output Summary (Last 24 hours) at 08/27/2020 1845 Last data filed at 08/27/2020 1000 Gross per 24 hour  Intake 2013.8 ml  Output 1620 ml  Net 393.8 ml   Creatinine 1.07, K= 3.9 Hct 24 stable  Doing well POD # 1  Caroly Purewal C. Roxan Hockey, MD [facility] [phone]

## 2020-08-27 NOTE — Progress Notes (Signed)
1 Day Post-Op Procedure(s) (LRB): CORONARY ARTERY BYPASS GRAFTING (CABG), ON PUMP, TIMES FOUR, USING LEFT INTERNAL MAMMARY ARTERY AND RIGHT ENDOSCOPICALLY HARVESTED GREATER SAPHENOUS VEIN (N/A) TRANSESOPHAGEAL ECHOCARDIOGRAM (TEE) (N/A) INDOCYANINE GREEN FLUORESCENCE IMAGING (ICG) (N/A) ENDOVEIN HARVEST OF GREATER SAPHENOUS VEIN (Right) Subjective: C/o incisional pain, nausea earlier- resolved  Objective: Vital signs in last 24 hours: Temp:  [94.28 F (34.6 C)-101.12 F (38.4 C)] 99.14 F (37.3 C) (06/11 0800) Pulse Rate:  [79-99] 83 (06/11 0800) Cardiac Rhythm: Normal sinus rhythm (06/10 2000) Resp:  [12-35] 21 (06/11 0800) BP: (87-167)/(53-100) 121/61 (06/11 0800) SpO2:  [85 %-100 %] 100 % (06/11 0800) Arterial Line BP: (98-216)/(45-79) 155/57 (06/11 0800) FiO2 (%):  [40 %-50 %] 40 % (06/10 1704) Weight:  [82.7 kg] 82.7 kg (06/11 0500)  Hemodynamic parameters for last 24 hours: PAP: (9-73)/(-5-24) 26/23 CO:  [2.1 L/min-5.2 L/min] 5.2 L/min CI:  [1.1 L/min/m2-2.8 L/min/m2] 2.7 L/min/m2  Intake/Output from previous day: 06/10 0701 - 06/11 0700 In: 7762.2 [I.V.:5513.4; Blood:375; IV Piggyback:1873.8] Out: 9465 [Urine:7805; Blood:950; Chest Tube:710] Intake/Output this shift: No intake/output data recorded.  General appearance: alert, cooperative, and no distress Neurologic: intact Heart: regular rate and rhythm Lungs: diminished breath sounds bibasilar Abdomen: nondistended, hypoactive BS  Lab Results: Recent Labs    08/26/20 1837 08/26/20 1839 08/27/20 0419  WBC 11.1*  --  7.7  HGB 9.1* 8.5* 7.6*  HCT 28.4* 25.0* 24.1*  PLT 172  --  125*   BMET:  Recent Labs    08/26/20 1837 08/26/20 1839 08/27/20 0419  NA 142 144 138  K 4.1 3.9 4.0  CL 111  --  109  CO2 23  --  23  GLUCOSE 139*  --  127*  BUN 9  --  9  CREATININE 0.99  --  1.05  CALCIUM 7.3*  --  7.4*    PT/INR:  Recent Labs    08/26/20 1244  LABPROT 17.4*  INR 1.4*   ABG    Component Value  Date/Time   PHART 7.344 (L) 08/26/2020 1839   HCO3 22.4 08/26/2020 1839   TCO2 24 08/26/2020 1839   ACIDBASEDEF 3.0 (H) 08/26/2020 1839   O2SAT 94.0 08/26/2020 1839   CBG (last 3)  Recent Labs    08/27/20 0312 08/27/20 0403 08/27/20 0616  GLUCAP 134* 123* 126*    Assessment/Plan: S/P Procedure(s) (LRB): CORONARY ARTERY BYPASS GRAFTING (CABG), ON PUMP, TIMES FOUR, USING LEFT INTERNAL MAMMARY ARTERY AND RIGHT ENDOSCOPICALLY HARVESTED GREATER SAPHENOUS VEIN (N/A) TRANSESOPHAGEAL ECHOCARDIOGRAM (TEE) (N/A) INDOCYANINE GREEN FLUORESCENCE IMAGING (ICG) (N/A) ENDOVEIN HARVEST OF GREATER SAPHENOUS VEIN (Right) POD # 1 NEURO- intact CV- hemodynamics stable, in SR, still on some nitroglycerin for BP  Dc Swan and A line, increase Lopressor, wean nitro RESP_ IS  RENAL- creatinine and lytes OK, I/O negative  Urine minimally blood tinged, Keep Foley x 1 week ENDO- CBG well controlled, transition off insulin drip Gi- advance diet as tolerated Anemia secondary to ABL- follow Thrombocytopenia- mild at 125K, monitor SCD + enoxaparin for DVT prophylaxis Dc chest tubes mobilize   LOS: 3 days    Tony Shaffer 08/27/2020

## 2020-08-27 NOTE — Evaluation (Signed)
Physical Therapy Evaluation Patient Details Name: Tony Shaffer MRN: [id number] DOB: 1950-08-27 Today's Date: 08/27/2020   History of Present Illness  pt is a 70 y/o male presenting 76 with radiating CP with activity over the last month.  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  PMHx:  BPH, HTN, GERD  Clinical Impression  Pt admitted with/for CABG.  Pt needing minimal assist overall for mobility and initiated cardiac info and sternal precautions..  Pt currently limited functionally due to the problems listed below.  (see problems list.)  Pt will benefit from PT to maximize function and safety to be able to get home safely with available assist.     Follow Up Recommendations Home health PT;Supervision - Intermittent    Equipment Recommendations  Other (comment) (TBA)    Recommendations for Other Services       Precautions / Restrictions Precautions Precautions: Sternal;Fall Precaution Booklet Issued: No      Mobility  Bed Mobility               General bed mobility comments: OOB in the chair on arrival    Transfers Overall transfer level: Needs assistance   Transfers: Sit to/from Stand Sit to Stand: Min assist         General transfer comment: cues for sternal prec, prep for standing.  assist forward and minor boost.  Ambulation/Gait Ambulation/Gait assistance: Min assist Gait Distance (Feet): 80 Feet Assistive device:  (EVA walker) Gait Pattern/deviations: Step-through pattern   Gait velocity interpretation: <1.8 ft/sec, indicate of risk for recurrent falls General Gait Details: slow, tentative, short steps.  Painful when EVA got to far in front.  Stairs            Wheelchair Mobility    Modified Rankin (Stroke Patients Only)       Balance Overall balance assessment: Needs assistance   Sitting balance-Leahy Scale: Fair       Standing balance-Leahy Scale: Poor Standing balance comment: reliant on AD or external support                              Pertinent Vitals/Pain Pain Assessment: Faces Faces Pain Scale: Hurts whole lot Pain Location: sternal Pain Descriptors / Indicators: Moaning;Grimacing;Guarding;Discomfort Pain Intervention(s): Monitored during session    Home Living Family/patient expects to be discharged to:: Private residence Living Arrangements: Alone Available Help at Discharge: Family;Other (Comment) (brother staying with him) Type of Home: House Home Access: Stairs to enter Entrance Stairs-Rails: None Entrance Stairs-Number of Steps: 2-3 Home Layout: One level Home Equipment: None      Prior Function Level of Independence: Independent         Comments: brother drives, I with ADL's, retired.     Hand Dominance   Dominant Hand: Right    Extremity/Trunk Assessment   Upper Extremity Assessment Upper Extremity Assessment: Overall WFL for tasks assessed    Lower Extremity Assessment Lower Extremity Assessment: Overall WFL for tasks assessed    Cervical / Trunk Assessment Cervical / Trunk Assessment: Normal  Communication   Communication: No difficulties  Cognition Arousal/Alertness: Awake/alert Behavior During Therapy: WFL for tasks assessed/performed Overall Cognitive Status: Within Functional Limits for tasks assessed                                        General Comments General comments (skin integrity, edema, etc.): vss on 3L  [place] --SpO2 in the middle 90's and HR in the 90's. Reinforced sternal precautions for each task and in general.    Exercises     Assessment/Plan    PT Assessment Patient needs continued PT services  PT Problem List Decreased activity tolerance;Decreased mobility;Decreased knowledge of use of DME;Decreased knowledge of precautions;Cardiopulmonary status limiting activity;Pain       PT Treatment Interventions DME instruction;Gait training;Stair training;Functional mobility training;Therapeutic activities;Patient/family education    PT  Goals (Current goals can be found in the Care Plan section)  Acute Rehab PT Goals Patient Stated Goal: independent and active PT Goal Formulation: With patient Time For Goal Achievement: 09/10/20 Potential to Achieve Goals: Good    Frequency Min 3X/week   Barriers to discharge        Co-evaluation               AM-PAC PT "6 Clicks" Mobility  Outcome Measure Help needed turning from your back to your side while in a flat bed without using bedrails?: A Little Help needed moving from lying on your back to sitting on the side of a flat bed without using bedrails?: A Little Help needed moving to and from a bed to a chair (including a wheelchair)?: A Little Help needed standing up from a chair using your arms (e.g., wheelchair or bedside chair)?: A Little Help needed to walk in hospital room?: A Little Help needed climbing 3-5 steps with a railing? : A Little 6 Click Score: 18    End of Session Equipment Utilized During Treatment: Oxygen Activity Tolerance: Patient tolerated treatment well Patient left: in chair;with call bell/phone within reach Nurse Communication: Mobility status PT Visit Diagnosis: Unsteadiness on feet (R26.81);Other abnormalities of gait and mobility (R26.89);Pain;Difficulty in walking, not elsewhere classified (R26.2) Pain - part of body:  (sternal)    Time: 8115-7262 PT Time Calculation (min) (ACUTE ONLY): 39 min   Charges:   PT Evaluation $PT Eval Moderate Complexity: 1 Mod PT Treatments $Gait Training: 8-22 mins $Therapeutic Activity: 8-22 mins        08/27/2020  Ginger Carne., PT Acute Rehabilitation Services [phone]  (pager) [phone]  (office)  [name] 08/27/2020, 6:24 PM

## 2020-08-28 ENCOUNTER — Inpatient Hospital Stay (HOSPITAL_COMMUNITY): Payer: Medicare HMO

## 2020-08-28 LAB — CBC
HCT: 25.8 % — ABNORMAL LOW (ref 39.0–52.0)
Hemoglobin: 8.2 g/dL — ABNORMAL LOW (ref 13.0–17.0)
MCH: 29.1 pg (ref 26.0–34.0)
MCHC: 31.8 g/dL (ref 30.0–36.0)
MCV: 91.5 fL (ref 80.0–100.0)
Platelets: 140 10*3/uL — ABNORMAL LOW (ref 150–400)
RBC: 2.82 MIL/uL — ABNORMAL LOW (ref 4.22–5.81)
RDW: 13.5 % (ref 11.5–15.5)
WBC: 10.1 10*3/uL (ref 4.0–10.5)
nRBC: 0 % (ref 0.0–0.2)

## 2020-08-28 LAB — BASIC METABOLIC PANEL
Anion gap: 9 (ref 5–15)
BUN: 12 mg/dL (ref 8–23)
CO2: 24 mmol/L (ref 22–32)
Calcium: 8.2 mg/dL — ABNORMAL LOW (ref 8.9–10.3)
Chloride: 103 mmol/L (ref 98–111)
Creatinine, Ser: 1.17 mg/dL (ref 0.61–1.24)
GFR, Estimated: >60 mL/min (ref >60)
Glucose, Bld: 111 mg/dL — ABNORMAL HIGH (ref 70–99)
Potassium: 4.1 mmol/L (ref 3.5–5.1)
Sodium: 136 mmol/L (ref 135–145)

## 2020-08-28 LAB — GLUCOSE, CAPILLARY
Glucose-Capillary: 143 mg/dL — ABNORMAL HIGH (ref 70–99)
Glucose-Capillary: 110 mg/dL — ABNORMAL HIGH (ref 70–99)
Glucose-Capillary: 97 mg/dL (ref 70–99)
Glucose-Capillary: 110 mg/dL — ABNORMAL HIGH (ref 70–99)
Glucose-Capillary: 92 mg/dL (ref 70–99)
Glucose-Capillary: 167 mg/dL — ABNORMAL HIGH (ref 70–99)
Glucose-Capillary: 111 mg/dL — ABNORMAL HIGH (ref 70–99)

## 2020-08-28 MED ORDER — ~~LOC~~ CARDIAC SURGERY, PATIENT & FAMILY EDUCATION: Freq: Once | Status: AC

## 2020-08-28 MED ORDER — SODIUM CHLORIDE 0.9 % IV SOLN: 250.0000 mL | INTRAVENOUS | Status: DC | PRN

## 2020-08-28 MED ORDER — POTASSIUM CHLORIDE CRYS ER 10 MEQ PO TBCR
20.0000 meq | EXTENDED_RELEASE_TABLET | Freq: Every day | ORAL | Status: DC
  Administered 2020-08-28 – 2020-08-31 (×4): 20 meq via ORAL
  Filled 2020-08-28 (×6): qty 2

## 2020-08-28 MED ORDER — FUROSEMIDE 40 MG PO TABS
40.0000 mg | ORAL_TABLET | Freq: Every day | ORAL | Status: DC
  Administered 2020-08-28 – 2020-08-31 (×4): 40 mg via ORAL
  Filled 2020-08-28 (×4): qty 1

## 2020-08-28 MED ORDER — INSULIN ASPART 100 UNIT/ML IJ SOLN: 0.0000 [IU] | Freq: Three times a day (TID) | INTRAMUSCULAR | Status: DC

## 2020-08-28 MED ORDER — SODIUM CHLORIDE 0.9% FLUSH
3.0000 mL | Freq: Two times a day (BID) | INTRAVENOUS | Status: DC
  Administered 2020-08-29 – 2020-08-31 (×6): 3 mL via INTRAVENOUS

## 2020-08-28 MED ORDER — ALUM & MAG HYDROXIDE-SIMETH 200-200-20 MG/5ML PO SUSP: 15.0000 mL | Freq: Four times a day (QID) | ORAL | Status: DC | PRN

## 2020-08-28 MED ORDER — MAGNESIUM HYDROXIDE 400 MG/5ML PO SUSP: 30.0000 mL | Freq: Every day | ORAL | Status: DC | PRN

## 2020-08-28 MED ORDER — SODIUM CHLORIDE 0.9% FLUSH
3.0000 mL | INTRAVENOUS | Status: DC | PRN
Start: 2020-08-28 — End: 2020-08-31

## 2020-08-28 NOTE — Progress Notes (Signed)
Pt. Arrived via wheelchair. CCMD notified and pt. Admitted..VSS/NSR-83 with a GCS-15. Orders received and carried out. Will continue to monitor cosely.

## 2020-08-28 NOTE — Progress Notes (Signed)
Spoke with RN concerning DC central line order RN is aware

## 2020-08-28 NOTE — Progress Notes (Signed)
2 Days Post-Op Procedure(s) (LRB): CORONARY ARTERY BYPASS GRAFTING (CABG), ON PUMP, TIMES FOUR, USING LEFT INTERNAL MAMMARY ARTERY AND RIGHT ENDOSCOPICALLY HARVESTED GREATER SAPHENOUS VEIN (N/A) TRANSESOPHAGEAL ECHOCARDIOGRAM (TEE) (N/A) INDOCYANINE GREEN FLUORESCENCE IMAGING (ICG) (N/A) ENDOVEIN HARVEST OF GREATER SAPHENOUS VEIN (Right) Subjective: Some incisional pain, better than yesterday  Objective: Vital signs in last 24 hours: Temp:  [97.5 F (36.4 C)-99.14 F (37.3 C)] 97.5 F (36.4 C) (06/12 0719) Pulse Rate:  [68-150] 74 (06/12 0700) Cardiac Rhythm: Normal sinus rhythm (06/11 2000) Resp:  [13-27] 21 (06/12 0700) BP: (102-140)/(54-67) 127/65 (06/12 0700) SpO2:  [92 %-100 %] 97 % (06/12 0700) Arterial Line BP: (136-165)/(46-61) 165/61 (06/11 1015) FiO2 (%):  [28 %-36 %] 28 % (06/11 1334) Weight:  [80.1 kg] 80.1 kg (06/12 0500)  Hemodynamic parameters for last 24 hours: PAP: (25-26)/(20-23) 26/22 CO:  [5.2 L/min] 5.2 L/min CI:  [2.7 L/min/m2] 2.7 L/min/m2  Intake/Output from previous day: 06/11 0701 - 06/12 0700 In: 1259.7 [P.O.:400; I.V.:467.6; IV Piggyback:392.1] Out: 705 [Urine:705] Intake/Output this shift: No intake/output data recorded.  General appearance: alert, cooperative, and no distress Neurologic: intact Heart: regular rate and rhythm Lungs: diminished breath sounds bibasilar  Lab Results: Recent Labs    08/27/20 1458 08/28/20 0325  WBC 10.0 10.1  HGB 7.6* 8.2*  HCT 24.5* 25.8*  PLT 141* 140*   BMET:  Recent Labs    08/27/20 1458 08/28/20 0325  NA 138 136  K 3.9 4.1  CL 107 103  CO2 24 24  GLUCOSE 132* 111*  BUN 9 12  CREATININE 1.07 1.17  CALCIUM 7.9* 8.2*    PT/INR:  Recent Labs    08/26/20 1244  LABPROT 17.4*  INR 1.4*   ABG    Component Value Date/Time   PHART 7.344 (L) 08/26/2020 1839   HCO3 22.4 08/26/2020 1839   TCO2 24 08/26/2020 1839   ACIDBASEDEF 3.0 (H) 08/26/2020 1839   O2SAT 94.0 08/26/2020 1839   CBG  (last 3)  Recent Labs    08/27/20 2349 08/28/20 0325 08/28/20 0716  GLUCAP 114* 110* 97    Assessment/Plan: S/P Procedure(s) (LRB): CORONARY ARTERY BYPASS GRAFTING (CABG), ON PUMP, TIMES FOUR, USING LEFT INTERNAL MAMMARY ARTERY AND RIGHT ENDOSCOPICALLY HARVESTED GREATER SAPHENOUS VEIN (N/A) TRANSESOPHAGEAL ECHOCARDIOGRAM (TEE) (N/A) INDOCYANINE GREEN FLUORESCENCE IMAGING (ICG) (N/A) ENDOVEIN HARVEST OF GREATER SAPHENOUS VEIN (Right) Plan for transfer to step-down: see transfer orders POD # 2 Doing well  CV- in Sr, Bp Ok  ASA, beta blocker, statin RESP- continue IS for atelectasis RENAL- creatinine and lytes OK  Weight still up- will start PO Lasix ENDO- CBG well controlled  Dc levemir, change CBG, SSI to AC and HS Anemia secondary to ABL- Hgb up slightly today Continue cardiac rehab SCD + Enoxaparin for DVT propylaxis   LOS: 4 days    Melrose Nakayama 08/28/2020

## 2020-08-28 NOTE — Plan of Care (Signed)
  Problem: Clinical Measurements: Goal: Ability to maintain clinical measurements within normal limits will improve Outcome: Progressing Goal: Will remain free from infection Outcome: Progressing Goal: Diagnostic test results will improve Outcome: Progressing Goal: Respiratory complications will improve Outcome: Progressing Goal: Cardiovascular complication will be avoided Outcome: Progressing   Problem: Education: Goal: Understanding of CV disease, CV risk reduction, and recovery process will improve Outcome: Progressing Goal: Individualized Educational Video(s) Outcome: Progressing   Problem: Education: Goal: Knowledge of General Education information will improve Description: Including pain rating scale, medication(s)/side effects and non-pharmacologic comfort measures Outcome: Progressing

## 2020-08-29 ENCOUNTER — Encounter (HOSPITAL_COMMUNITY): Payer: Self-pay | Admitting: Cardiothoracic Surgery

## 2020-08-29 ENCOUNTER — Inpatient Hospital Stay (HOSPITAL_COMMUNITY): Payer: Medicare HMO

## 2020-08-29 LAB — BASIC METABOLIC PANEL
Anion gap: 6 (ref 5–15)
BUN: 11 mg/dL (ref 8–23)
CO2: 24 mmol/L (ref 22–32)
Calcium: 8.1 mg/dL — ABNORMAL LOW (ref 8.9–10.3)
Chloride: 105 mmol/L (ref 98–111)
Creatinine, Ser: 1.06 mg/dL (ref 0.61–1.24)
GFR, Estimated: >60 mL/min (ref >60)
Glucose, Bld: 116 mg/dL — ABNORMAL HIGH (ref 70–99)
Potassium: 4 mmol/L (ref 3.5–5.1)
Sodium: 135 mmol/L (ref 135–145)

## 2020-08-29 LAB — CBC
HCT: 26.3 % — ABNORMAL LOW (ref 39.0–52.0)
Hemoglobin: 8.3 g/dL — ABNORMAL LOW (ref 13.0–17.0)
MCH: 28.5 pg (ref 26.0–34.0)
MCHC: 31.6 g/dL (ref 30.0–36.0)
MCV: 90.4 fL (ref 80.0–100.0)
Platelets: 171 10*3/uL (ref 150–400)
RBC: 2.91 MIL/uL — ABNORMAL LOW (ref 4.22–5.81)
RDW: 13.5 % (ref 11.5–15.5)
WBC: 11 10*3/uL — ABNORMAL HIGH (ref 4.0–10.5)
nRBC: 0.2 % (ref 0.0–0.2)

## 2020-08-29 LAB — GLUCOSE, CAPILLARY
Glucose-Capillary: 106 mg/dL — ABNORMAL HIGH (ref 70–99)
Glucose-Capillary: 93 mg/dL (ref 70–99)
Glucose-Capillary: 103 mg/dL — ABNORMAL HIGH (ref 70–99)
Glucose-Capillary: 120 mg/dL — ABNORMAL HIGH (ref 70–99)

## 2020-08-29 MED ORDER — LOSARTAN POTASSIUM 25 MG PO TABS
25.0000 mg | ORAL_TABLET | Freq: Every day | ORAL | Status: DC
  Administered 2020-08-29 – 2020-08-31 (×3): 25 mg via ORAL
  Filled 2020-08-29 (×3): qty 1

## 2020-08-29 MED ORDER — COLCHICINE 0.3 MG HALF TABLET
0.3000 mg | ORAL_TABLET | Freq: Two times a day (BID) | ORAL | Status: DC
  Administered 2020-08-29 – 2020-08-31 (×4): 0.3 mg via ORAL
  Filled 2020-08-29 (×6): qty 1

## 2020-08-29 NOTE — Discharge Summary (Signed)
Physician Discharge Summary       [address]       [place] [address]             [id number]    Patient ID: Tony Shaffer MRN: [id number] DOB/AGE: 10-24-1950 70 y.o.  Admit date: 08/23/2020 Discharge date: 08/31/2020  Admission Diagnoses: NSTEMI (non-ST elevated myocardial infarction) ([facility]) 2. Coronary artery disease  Discharge Diagnoses:  1. S/P CABG x 4 2. Expected post op blood loss anemia 3. History of hypertension 4. History of BPH 5. History of GERD (gastroesophageal reflux disease)  6. History of nocturnal leg cramps 7. History of seasonal allergies 8. History of new growth on his bladder    Consults: urology  Procedure (s):  TRANSESOPHAGEAL ECHOCARDIOGRAM (TEE), CORONARY ARTERY BYPASS GRAFTING (CABG), ON PUMP, TIMES FOUR, (LIMA to LAD, SVG SEQUENTIALLY to DIAGONAL and OM, SVG to PDA) USING LEFT INTERNAL MAMMARY ARTERY AND RIGHT ENDOSCOPICALLY HARVESTED GREATER SAPHENOUS VEIN, and INDOCYANINE GREEN FLUORESCENCE IMAGING (ICG) by Dr. Orvan Seen on 26/12/2020.  History of Presenting Illness: Mr. Tony Shaffer is a 70 year old male patient with past medical history significant for right bundle branch block, BPH status post prostatectomy in 2014, and hypertension who was previously referred to Dr. Audie Box in September of last year for evaluation of abnormal EKG.  He was noted to have a right bundle branch block at this time and his blood pressure was elevated during the visit.  Echocardiogram was recommended to rule out structural heart disease.  Echocardiogram was performed on 12/11/2019 which showed an EF of 60 to 65% and no significant valvular disease.  Since his visit he had had some substernal chest pressure radiating across his chest since fall of last year.  This past spring he increased his exercise level and the chest pain came back with activity.  Over the past few months he has noticed increasing intensity of the chest pain and frequency.  He reached out  to his primary care provider last week who obtained some blood work.  On 08/23/2020 he was outside playing with his dog and experienced 2 episodes of chest pain relieved by rest.  His lab results showed elevated D-dimer and he was instructed to go to the emergency room.   During his time in the [facility] long ED ,they did a repeat D-dimer which was normal however serial enzymes were mildly elevated.  His initial troponin was 52 and subsequently went up to 85.  His blood pressure was 349 systolic.  He was placed on IV heparin and cardiology was consulted.  He underwent cardiac catheterization on 08/24/2020 which showed ostial to proximal circumflex stenosis of 40%, distal circumflex lesion of 40%, ostial to mid left main lesion of 50%, ostial to proximal LAD lesion of 99%, and a left ventricular ejection fraction of 65%.  Due to his severe LAD disease, cardiology recommended revascularization via coronary artery bypass grafting.  We are consulted for possible surgical revascularization.   He does stay pretty active at home and was running/walking more frequently before his symptoms progressed. He does have a family history of heart disease in his mother and sister.   Dr. Orvan Seen discussed the need for coronary artery bypass grafting surgery. Potential risks, benefits, and complications of the surgery were discussed with the patient and he agreed to proceed with surgery. Pre operative carotid duplex US showed no significant internal carotid artery stenosis bilaterally. He underwent a CABG x 4 on 08/26/2020.  Brief Hospital Course:  Patient was extubated early  evening after surgery. He remained afebrile and hemodynamically stable. He was weaned off Nitro drip. Gordy Councilman, a line, and chest tubes were removed early in his post operative course. Foley remained as urologist had to place prior to surgery and recommended it remain for one week before trying voiding trial. He was started on Lopressor. He was volume  overloaded and diuresed accordingly. He had expected post op blood loss anemia. He did not require a post op transfusion. His H and H on 06/13 was stable at  8.3 and 26.3. He had mild thrombocytopenia but this resolved as platelet count was up to 171,000. He was weaned off the Insulin drip. Pre op HGA1C was 5.7. He likely has pre diabetes. He will need further surveillance by his medical doctor after discharge. He was felt surgically stable for transfer from the ICU to 4E for further convalescence on 06/12. He is ambulating on room air. He is tolerating a diet and has had a bowel movement. His wounds are clean, dry, and healing without signs of infection. Epicardial pacing wires were removed on 06/13. Chest tube sutures will be removed at discharge. He presented with ACS/NSTEMI. He was started on Plavix 75 mg daily and ec asa was decreased to 81 mg daily on 06/14. Of note, patient lives alone. He has made arrangements for his brother, friend, and neighbor to check on him. He does not want to go to SNF.  An appointment has been made with urology for foley removal and voiding trial on Frdiay 06/17. As discussed with Dr. Orvan Seen, the patient is felt surgically stable for discharge today.   Latest Vital Signs: Blood pressure 138/73, pulse 87, temperature 97.9 F (36.6 C), temperature source Oral, resp. rate 20, height 5\' 9"  (1.753 m), weight 76.7 kg, SpO2 100 %.  Physical Exam: Cardiovascular: RRR Pulmonary: Crackles Abdomen: Soft, non tender, bowel sounds present. Extremities: Tracebilateral lower extremity edema. Wounds: Sternal wound is clean and dry.  No erythema or signs of infection. RLE wound is clean and dry   Discharge Condition:Stable and discharged to home.  Recent laboratory studies:  Lab Results  Component Value Date   WBC 11.0 (H) 08/29/2020   HGB 8.3 (L) 08/29/2020   HCT 26.3 (L) 08/29/2020   MCV 90.4 08/29/2020   PLT 171 08/29/2020   Lab Results  Component Value Date   NA 135  08/29/2020   K 4.0 08/29/2020   CL 105 08/29/2020   CO2 24 08/29/2020   CREATININE 1.06 08/29/2020   GLUCOSE 116 (H) 08/29/2020      Diagnostic Studies: DG Chest 1 View  Result Date: 08/26/2020 CLINICAL DATA:  Catheter placement EXAM: CHEST  1 VIEW COMPARISON:  Earlier same day FINDINGS: Endotracheal tube tip 3 cm above the carina. Swan-Ganz catheter placed from a right internal jugular approach has its tip in the right main pulmonary artery. Bilateral chest tubes are in place. No pneumothorax. No pulmonary edema. No significant atelectasis. IMPRESSION: Swan-Ganz catheter tip now in the right main pulmonary artery. No other change. No pneumothorax. Electronically Signed   By: Nelson Chimes M.D.   On: 08/26/2020 15:11   DG Chest 2 View  Result Date: 08/29/2020 CLINICAL DATA:  CABG EXAM: CHEST - 2 VIEW COMPARISON:  08/28/2020 FINDINGS: Right jugular sheath removed.  No pneumothorax. Bibasilar atelectasis unchanged. Minimal pleural effusion bilaterally. Negative for edema. IMPRESSION: Slight bibasilar atelectasis and minimal bilateral effusions. No pneumothorax. Electronically Signed   By: Franchot Gallo M.D.   On: 08/29/2020 07:27  DG Chest 2 View  Result Date: 08/23/2020 CLINICAL DATA:  Chest pain EXAM: CHEST - 2 VIEW COMPARISON:  10/06/2012 FINDINGS: Lungs are well expanded, symmetric, and clear. No pneumothorax or pleural effusion. Cardiac size within normal limits. Pulmonary vascularity is normal. Osseous structures are age-appropriate. No acute bone abnormality. IMPRESSION: No active cardiopulmonary disease. Electronically Signed   By: Fidela Salisbury MD   On: 08/23/2020 21:51   DG Thoracic Spine W/Swimmers  Result Date: 08/20/2020 CLINICAL DATA:  Low back pain. EXAM: THORACIC SPINE - 3 VIEWS COMPARISON:  None. FINDINGS: Degenerative changes in the lower cervical spine with anterior osteophytes. Focal degenerative disc disease in the midthoracic spine. No fracture or malalignment. No other  significant abnormalities. IMPRESSION: Degenerative changes in the lower cervical spine. Focal degenerative disc disease in the midthoracic spine. Electronically Signed   By: Dorise Bullion III M.D   On: 08/20/2020 09:10   DG Lumbar Spine Complete  Result Date: 08/20/2020 CLINICAL DATA:  Low back pain. EXAM: LUMBAR SPINE - COMPLETE 4+ VIEW COMPARISON:  None. FINDINGS: No fracture or traumatic malalignment. Mild degenerative disc disease at several levels with small anterior osteophytes. Lower lumbar facet degenerative changes. Mild calcified atherosclerosis in the abdominal aorta. IMPRESSION: Degenerative disc disease with small anterior osteophytes and lower lumbar facet degenerative changes. Mild calcified atherosclerosis in the abdominal aorta. Electronically Signed   By: Dorise Bullion III M.D   On: 08/20/2020 09:12   CARDIAC CATHETERIZATION  Result Date: 08/24/2020  Ost Cx to Prox Cx lesion is 40% stenosed.  Dist Cx lesion is 40% stenosed.  Ost LM to Mid LM lesion is 50% stenosed.  Ost LAD to Prox LAD lesion is 99% stenosed.  The left ventricular systolic function is normal.  LV end diastolic pressure is normal.  The left ventricular ejection fraction is greater than 65% by visual estimate.  There is no mitral valve regurgitation.  1. Moderate distal left main stenosis 2. Severe, heavily calcified ostial/proximal LAD stenosis. This is a functional chronic occlusion. The LAD also fills from right to left collaterals through a small, early branch. 3. Mild disease in the dominant Circumflex 4. Small non-dominant RCA 5. Normal LV systolic function Recommendations: We will ask  CT surgery to see him tomorrow to review candidacy for bypass surgery. I think CABG is the best option for revascularization. I have reviewed the films with Dr. Fletcher Anon as well. Continue ASA and statin. Will restart IV heparin 6 hours post sheath pull.   DG Chest Port 1 View  Result Date: 08/28/2020 CLINICAL DATA:  Coronary  artery bypass graft EXAM: PORTABLE CHEST 1 VIEW COMPARISON:  1 day prior FINDINGS: Removal of Swan-Ganz catheter. Right IJ Cordis sheath remains. Midline trachea. Prior median sternotomy. Moderate cardiomegaly. Moderate right hemidiaphragm elevation. Probable small left pleural effusion. Removal of bilateral chest tubes. No pneumothorax. Nonspecific pulmonary interstitial thickening is similar. Bibasilar atelectasis is not significantly changed. IMPRESSION: Removal of support apparatus, without pneumothorax or other acute complication. Probable small left pleural effusion with bibasilar atelectasis. Electronically Signed   By: Abigail Miyamoto M.D.   On: 08/28/2020 08:00   DG Chest Port 1 View  Result Date: 08/27/2020 CLINICAL DATA:  Chest pain.  Bypass surgery. EXAM: PORTABLE CHEST 1 VIEW COMPARISON:  Chest x-ray 08/26/2020 FINDINGS: The endotracheal tube has been removed. The right IJ Swan-Ganz catheter is in good position. The tip is in the proximal right pulmonary artery. Bilateral chest tubes are stable.  No pneumothorax. Mediastinal drain tubes are in place. Epicardial  pacer wires are noted. Underlying emphysematous changes and pulmonary scarring. No overlying pulmonary edema or large pleural effusions. IMPRESSION: Postoperative support apparatus in good position. The endotracheal tube has been removed. No pneumothorax or pleural effusions. Electronically Signed   By: Marijo Sanes M.D.   On: 08/27/2020 10:08   DG Chest Port 1 View  Result Date: 08/26/2020 CLINICAL DATA:  CABG EXAM: PORTABLE CHEST 1 VIEW COMPARISON:  08/23/2020 FINDINGS: Interval postsurgical changes to the chest status post coronary artery bypass graft. Endotracheal tube terminates 2.6 cm above the carina. Right IJ central venous catheter appears coiled within the right atrium. Mediastinal drain and bilateral chest tubes in place. Stable heart size. Atherosclerotic calcification of the aortic knob. Mild right basilar atelectasis. No  pneumothorax. IMPRESSION: 1. Interval postsurgical changes to the chest status post coronary artery bypass graft. 2. Right IJ central venous catheter appears coiled within the right atrium. Recommend repositioning. Electronically Signed   By: Davina Poke D.O.   On: 08/26/2020 13:38   ECHOCARDIOGRAM COMPLETE  Result Date: 08/25/2020    ECHOCARDIOGRAM REPORT   Patient Name:   GLADE STRAUSSER Date of Exam: 08/25/2020 Medical Rec #:  [id number]       Height:       69.0 in Accession #:    [id number]      Weight:       166.8 lb Date of Birth:  1950-11-25        BSA:          1.913 m Patient Age:    19 years        BP:           155/102 mmHg Patient Gender: M               HR:           76 bpm. Exam Location:  Inpatient Procedure: Color Doppler, Cardiac Doppler, 2D Echo and Intracardiac            Opacification Agent Indications:    CAD  History:        Patient has prior history of Echocardiogram examinations, most                 recent 12/11/2019. CAD, Arrythmias:RBBB, Signs/Symptoms:Chest                 Pain; Risk Factors:Hypertension.  Sonographer:    Merrie Roof RDCS Referring Phys: [id number] [place]  1. Left ventricular ejection fraction, by estimation, is 55 to 60%. The left ventricle has normal function. The left ventricle demonstrates regional wall motion abnormalities with apical hypokinesis. Left ventricular diastolic parameters are consistent with Grade I diastolic dysfunction (impaired relaxation).  2. Right ventricular systolic function is normal. The right ventricular size is normal. Tricuspid regurgitation signal is inadequate for assessing PA pressure.  3. The mitral valve is normal in structure. No evidence of mitral valve regurgitation. No evidence of mitral stenosis.  4. The aortic valve is tricuspid. Aortic valve regurgitation is not visualized. Mild aortic valve sclerosis is present, with no evidence of aortic valve stenosis.  5. The IVC was not visualized. FINDINGS  Left  Ventricle: Left ventricular ejection fraction, by estimation, is 55 to 60%. The left ventricle has normal function. The left ventricle demonstrates regional wall motion abnormalities. The left ventricular internal cavity size was normal in size. There is no left ventricular hypertrophy. Left ventricular diastolic parameters are consistent with Grade I diastolic dysfunction (impaired relaxation). Right Ventricle: The  right ventricular size is normal. No increase in right ventricular wall thickness. Right ventricular systolic function is normal. Tricuspid regurgitation signal is inadequate for assessing PA pressure. Left Atrium: Left atrial size was normal in size. Right Atrium: Right atrial size was normal in size. Pericardium: There is no evidence of pericardial effusion. Mitral Valve: The mitral valve is normal in structure. No evidence of mitral valve regurgitation. No evidence of mitral valve stenosis. Tricuspid Valve: The tricuspid valve is normal in structure. Tricuspid valve regurgitation is not demonstrated. Aortic Valve: The aortic valve is tricuspid. Aortic valve regurgitation is not visualized. Mild aortic valve sclerosis is present, with no evidence of aortic valve stenosis. Aortic valve mean gradient measures 2.0 mmHg. Aortic valve peak gradient measures 4.4 mmHg. Aortic valve area, by VTI measures 2.78 cm. Pulmonic Valve: The pulmonic valve was normal in structure. Pulmonic valve regurgitation is not visualized. Aorta: The aortic root is normal in size and structure. IAS/Shunts: No atrial level shunt detected by color flow Doppler.  LEFT VENTRICLE PLAX 2D LVIDd:         5.20 cm  Diastology LVIDs:         2.90 cm  LV e' medial:    6.64 cm/s LV PW:         0.70 cm  LV E/e' medial:  6.2 LV IVS:        0.70 cm  LV e' lateral:   7.94 cm/s LVOT diam:     2.00 cm  LV E/e' lateral: 5.2 LV SV:         54 LV SV Index:   28 LVOT Area:     3.14 cm  RIGHT VENTRICLE RV Basal diam:  2.50 cm RV Mid diam:    1.90 cm  RV S prime:     11.70 cm/s TAPSE (M-mode): 2.2 cm LEFT ATRIUM           Index       RIGHT ATRIUM           Index LA diam:      3.00 cm 1.57 cm/m  RA Area:     10.60 cm LA Vol (A2C): 39.7 ml 20.76 ml/m RA Volume:   19.00 ml  9.93 ml/m LA Vol (A4C): 11.3 ml 5.91 ml/m  AORTIC VALVE AV Area (Vmax):    2.61 cm AV Area (Vmean):   2.55 cm AV Area (VTI):     2.78 cm AV Vmax:           105.00 cm/s AV Vmean:          67.200 cm/s AV VTI:            0.193 m AV Peak Grad:      4.4 mmHg AV Mean Grad:      2.0 mmHg LVOT Vmax:         87.20 cm/s LVOT Vmean:        54.500 cm/s LVOT VTI:          0.171 m LVOT/AV VTI ratio: 0.89  AORTA Ao Root diam: 3.10 cm Ao Asc diam:  3.20 cm MITRAL VALVE MV Area (PHT): 2.36 cm    SHUNTS MV Decel Time: 321 msec    Systemic VTI:  0.17 m MV E velocity: 41.10 cm/s  Systemic Diam: 2.00 cm MV A velocity: 78.00 cm/s MV E/A ratio:  0.53 Loralie Champagne MD Electronically signed by Loralie Champagne MD Signature Date/Time: 08/25/2020/4:51:58 PM    Final    ECHO INTRAOPERATIVE TEE  Result Date: 08/27/2020  *INTRAOPERATIVE TRANSESOPHAGEAL REPORT *  Patient Name:   JAMA KRICHBAUM Date of Exam: 08/26/2020 Medical Rec #:  [id number]       Height:       69.0 in Accession #:    [id number]      Weight:       166.2 lb Date of Birth:  1950/07/20        BSA:          1.91 m Patient Age:    7 years        BP:           159/93 mmHg Patient Gender: M               HR:           62 bpm. Exam Location:  Inpatient Transesophogeal exam was perform intraoperatively during surgical procedure. Patient was closely monitored under general anesthesia during the entirety of examination. Indications:     I25.750 Atherosclerosis of native coronary artery of                  transplanted heart with unstable angina Sonographer:     Roseanna Rainbow Performing Phys: [id number] Z ATKINS Diagnosing Phys: Belinda Block MD Complications: No known complications during this procedure. POST-OP IMPRESSIONS - Left Ventricle: Post CABG: The  patient came off bypass on the initial attempt. There did not appear to be any new findings from preop exam. Left ventricular contraction did improve with volume replacement. The TEE that had been placed uneventfully after induction was removed at the end of case without difficulty. The patient was later taken to the SICU in stable conditon. PRE-OP FINDINGS  Left Ventricle: The left ventricle has low normal systolic function, with an ejection fraction of 50-55%. Right Ventricle: The right ventricle has normal systolic function. Left Atrium: The left atrial appendage is well visualized and there is no evidence of thrombus present. Mitral Valve: The mitral valve is normal in structure. Tricuspid Valve: The tricuspid valve was normal in structure. Aortic Valve: The aortic valve is tricuspid.  Belinda Block MD Electronically signed by Belinda Block MD Signature Date/Time: 08/27/2020/8:35:59 AM   Final    VAS US DOPPLER PRE CABG  Result Date: 08/25/2020 PREOPERATIVE VASCULAR EVALUATION Patient Name:  SEDDRICK FLAX  Date of Exam:   08/25/2020 Medical Rec #: [id number]        Accession #:    [id number] Date of Birth: 09-Oct-1950         Patient Gender: M Patient Age:   70Y Exam Location:  [facility] Procedure:      VAS US DOPPLER PRE CABG Referring Phys: [id number] [place] --------------------------------------------------------------------------------  Indications:      Pre-CABG. Risk Factors:     Hypertension. Comparison Study: No prior study Performing Technologist: Maudry Mayhew MHA, RVT, RDCS, RDMS  Examination Guidelines: A complete evaluation includes B-mode imaging, spectral Doppler, color Doppler, and power Doppler as needed of all accessible portions of each vessel. Bilateral testing is considered an integral part of a complete examination. Limited examinations for reoccurring indications may be performed as noted.  Right Carotid Findings:  +----------+-------+--------+--------+-----------------------+-----------------+           PSV    EDV cm/sStenosisDescribe               Comments                    cm/s                                                            +----------+-------+--------+--------+-----------------------+-----------------+  CCA Prox  88     13                                                       +----------+-------+--------+--------+-----------------------+-----------------+ CCA Distal89     18                                     intimal                                                                   thickening        +----------+-------+--------+--------+-----------------------+-----------------+ ICA Prox  67     18              calcific and                                                              heterogenous                             +----------+-------+--------+--------+-----------------------+-----------------+ ICA Distal83     26                                                       +----------+-------+--------+--------+-----------------------+-----------------+ ECA       93     8                                                        +----------+-------+--------+--------+-----------------------+-----------------+ Portions of this table do not appear on this page. +----------+--------+-------+----------------+------------+           PSV cm/sEDV cmsDescribe        Arm Pressure +----------+--------+-------+----------------+------------+ Subclavian170            Multiphasic, WNL             +----------+--------+-------+----------------+------------+ +---------+--------+--+--------+--+---------+ VertebralPSV cm/s55EDV cm/s13Antegrade +---------+--------+--+--------+--+---------+ Left Carotid Findings: +----------+--------+--------+--------+----------------------+--------+           PSV cm/sEDV cm/sStenosisDescribe               Comments +----------+--------+--------+--------+----------------------+--------+ CCA Prox  76      14                                             +----------+--------+--------+--------+----------------------+--------+ CCA Distal99      19                                             +----------+--------+--------+--------+----------------------+--------+  ICA Prox  110     19              heterogenous and focal         +----------+--------+--------+--------+----------------------+--------+ ICA Distal88      32                                             +----------+--------+--------+--------+----------------------+--------+ ECA       72      10                                             +----------+--------+--------+--------+----------------------+--------+ +----------+--------+--------+----------------+------------+ SubclavianPSV cm/sEDV cm/sDescribe        Arm Pressure +----------+--------+--------+----------------+------------+           112             Multiphasic, WNL             +----------+--------+--------+----------------+------------+ +---------+--------+--+--------+-+---------+ VertebralPSV cm/s28EDV cm/s8Antegrade +---------+--------+--+--------+-+---------+  ABI Findings: +--------+------------------+-----+---------+--------+ Right   Rt Pressure (mmHg)IndexWaveform Comment  +--------+------------------+-----+---------+--------+ [id number]                    triphasic         +--------+------------------+-----+---------+--------+ PTA                            triphasic         +--------+------------------+-----+---------+--------+ DP                             biphasic          +--------+------------------+-----+---------+--------+ +--------+------------------+-----+---------+-------+ Left    Lt Pressure (mmHg)IndexWaveform Comment +--------+------------------+-----+---------+-------+ [id number]                     triphasic        +--------+------------------+-----+---------+-------+ PTA                            triphasic        +--------+------------------+-----+---------+-------+ DP                             biphasic         +--------+------------------+-----+---------+-------+  Right Doppler Findings: +-----------+--------+-----+---------+--------------------+ Site       PressureIndexDoppler  Comments             +-----------+--------+-----+---------+--------------------+ Brachial   202          triphasic                     +-----------+--------+-----+---------+--------------------+ Radial                  triphasic                     +-----------+--------+-----+---------+--------------------+ Ulnar                   triphasic                     +-----------+--------+-----+---------+--------------------+ Palmar Arch  Within normal limits +-----------+--------+-----+---------+--------------------+  Left Doppler Findings: +-----------+--------+-----+---------+-----------------------------------------+ Site       PressureIndexDoppler  Comments                                  +-----------+--------+-----+---------+-----------------------------------------+ Brachial   206          triphasic                                          +-----------+--------+-----+---------+-----------------------------------------+ Radial                  triphasic                                          +-----------+--------+-----+---------+-----------------------------------------+ Ulnar                   triphasic                                          +-----------+--------+-----+---------+-----------------------------------------+ Palmar Arch                      Signal is unaffected with radial                                           compression, decreases >50% with ulnar                                     compression.                               +-----------+--------+-----+---------+-----------------------------------------+  Summary: Right Carotid: Velocities in the right ICA are consistent with a 1-39% stenosis. Left Carotid: Velocities in the left ICA are consistent with a 1-39% stenosis. Vertebrals:  Bilateral vertebral arteries demonstrate antegrade flow. Subclavians: Normal flow hemodynamics were seen in bilateral subclavian              arteries.  Electronically signed by Monica Martinez MD on 08/25/2020 at 7:43:43 PM.    Final        Discharge Medications: Allergies as of 08/31/2020   No Known Allergies      Medication List     STOP taking these medications    ibuprofen 200 MG tablet Commonly known as: ADVIL       TAKE these medications    aspirin EC 81 MG tablet Take 81 mg by mouth daily. Swallow whole.   atorvastatin 40 MG tablet Commonly known as: LIPITOR Take 1 tablet (40 mg total) by mouth daily.   calcium-vitamin D 500-200 MG-UNIT tablet Commonly known as: OSCAL WITH D Take 1 tablet by mouth.   clopidogrel 75 MG tablet Commonly known as: PLAVIX Take 1 tablet (75 mg total) by mouth daily.   colchicine 0.6 MG tablet Take 0.5 tablets (0.3 mg total) by mouth 2 (two) times daily.   fluticasone 50 MCG/ACT nasal spray  Commonly known as: FLONASE Place 2 sprays into both nostrils 2 (two) times daily.   furosemide 40 MG tablet Commonly known as: LASIX Take 1 tablet (40 mg total) by mouth daily.   losartan 25 MG tablet Commonly known as: COZAAR Take 25 mg by mouth daily.   Metoprolol Tartrate 37.5 MG Tabs Take 37.5 mg by mouth 2 (two) times daily.   pantoprazole 40 MG tablet Commonly known as: PROTONIX Take 1 tablet by mouth 2 (two) times daily.   potassium chloride SA 20 MEQ tablet Commonly known as: KLOR-CON Take 1 tablet (20 mEq total) by mouth daily. For 5 days then stop.   tiZANidine 4 MG tablet Commonly known as: ZANAFLEX Take 4 mg by mouth every 8 (eight) hours  as needed.   traMADol 50 MG tablet Commonly known as: ULTRAM Take 1 tablet (50 mg total) by mouth every 6 (six) hours as needed for moderate pain.               Durable Medical Equipment  (From admission, onward)           Start     Ordered   08/30/20 0911  For home use only DME Walker rolling  Once       Question Answer Comment  Walker: With 5 Inch Wheels   Patient needs a walker to treat with the following condition Physical deconditioning      08/30/20 0910          The patient has been discharged on:   1.Beta Blocker:  Yes [  x ]                              No   [   ]                              If No, reason:  2.Ace Inhibitor/ARB: Yes [  x ]                                     No  [    ]                                     If No, reason:  3.Statin:   Yes [   x]                  No  [   ]                  If No, reason:  4.Ecasa:  Yes  [  x ]                  No   [  ]                  If No, reason:  Patient had ACS upon admission:  Plavix/P2Y12 inhibitor: Yes [  x ]                                      No  [   ]       Follow Up Appointments:  Follow-up Information     Wonda Olds, MD. Go on 09/22/2020.   Specialty: Cardiothoracic Surgery Why: Appointment time is at 1:45 pm Contact information: [place] [address] [phone]         Loel Dubonnet, NP. Go on 08/27/2020.   Specialty: Cardiology Why: Appointment time is at 11:15 am Contact information: [address] [place] [address] [phone]         Sonia Side., FNP. Call.   Specialty: Family Medicine Why: for a follow up appointment for about one month, regarding further surveillance of HGA1C 5.7 (pre diabetes) Contact information: [place] [address] [phone]         [facility]. Go on 09/02/2020.   Why: Appointment is with Dr. Junious Silk regarding removal of  foley catheter and voiding trial. Plese be there at 8:15 am for an 8:30 am appointment Contact information: [place] Follow up.   Why: rolling walker arranged- to be delivered to room prior to discharge Contact information: [place] [address] [phone]         [facility] Follow up.   Specialty: [place] Why: HHPT arranged- they will contact you post discharge within 48hr to schedule initial visit Contact information: [place] [address] [phone]                 Signed: Sharalyn Ink [facility] 08/31/2020, 8:21 AM

## 2020-08-29 NOTE — Progress Notes (Signed)
Mobility Specialist - Progress Note   08/29/20 1356  Mobility  Activity Ambulated in hall  Level of Assistance Standby assist, set-up cues, supervision of patient - no hands on  Assistive Device Front wheel walker  Distance Ambulated (ft) 350 ft  Mobility Ambulated with assistance in hallway  Mobility Response Tolerated well  Mobility performed by Mobility specialist  $Mobility charge 1 Mobility   Pt asx throughout ambulation. Pt back in bed after walk, call bell at side. VSS throughout.  Pricilla Handler Mobility Specialist Mobility Specialist Phone: [phone]

## 2020-08-29 NOTE — Progress Notes (Signed)
CARDIAC REHAB PHASE I   PRE:  Rate/Rhythm: 79 SR  BP:  Sitting: 117/93      SaO2: 96 RA  MODE:  Ambulation: 300 ft   POST:  Rate/Rhythm: 89 SR  BP:  Sitting: 146/82    SaO2: 100 RA   Pt ambulated 359ft in hallway with EVA. Pt denies CP, SOB, or dizziness. Pt returned to recliner. Encouraged continued ambulation and IS use. Instructed pt to use front wheel rolling walker with next walk. New IS given. Will continue to follow.  [id number] Rufina Falco, RN BSN 08/29/2020 11:24 AM

## 2020-08-29 NOTE — Progress Notes (Signed)
EPW discontinued per order w/o difficulty.  Wires intact upon removal, no discharge noted, patient tolerated well.  Bed rest initiated, vital signs Q15 per protocol.  Patient aware.

## 2020-08-29 NOTE — Progress Notes (Signed)
Physical Therapy Treatment Patient Details Name: Tony Shaffer MRN: [id number] DOB: [birth year] Today's Date: 08/29/2020    History of Present Illness pt is a 70 y/o male presenting 2 with radiating CP with activity over the last month.  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  PMHx:  BPH, HTN, GERD    PT Comments    Pt received in chair, agreeable to therapy session and with good participation and tolerance for gait and stair training. Pt VSS on RA during mobility (unable to obtain good pulse oximeter signal during ambulation but SpO2 WNL post-exertion) and with some chest tightness reported toward end of gait trial, RN notified and distance limited once symptoms reported. Pt performed steps x5 with min guard and cues for sequencing/Move in the Tube precautions needed. Pt continues to benefit from PT services to progress toward functional mobility goals. Will plan to progress gait distance next session within tolerance. Continue to recommend HHPT.   Follow Up Recommendations  Home health PT;Supervision - Intermittent     Equipment Recommendations  Other (comment) (TBA, likely RW)    Recommendations for Other Services       Precautions / Restrictions Precautions Precautions: Sternal;Fall Precaution Booklet Issued: Yes (comment) Precaution Comments: handout in room Restrictions Weight Bearing Restrictions: No Other Position/Activity Restrictions: sternal precs    Mobility  Bed Mobility      General bed mobility comments: OOB in the chair on arrival    Transfers Overall transfer level: Needs assistance Equipment used: Rolling walker (2 wheeled) Transfers: Sit to/from Stand Sit to Stand: Min assist         General transfer comment: cues for sternal prec, prep for standing. assist forward and minor boost to RW  Ambulation/Gait Ambulation/Gait assistance: Min guard Gait Distance (Feet): 150 Feet Assistive device: Rolling walker (2 wheeled) Gait Pattern/deviations: Step-through pattern    Gait velocity interpretation: <1.31 ft/sec, indicative of household ambulator General Gait Details: cues for keeping elbows tucked in and upright posture, deferred longer distance gait trial due to pt c/o increased chest pressure toward end of walk   Stairs Stairs: Yes Stairs assistance: Min guard Stair Management: One rail Right;Step to pattern;Forwards Number of Stairs: 5 (2 steps then 3 steps) General stair comments: pt ascended/descended 2 steps then 3 steps with R rail, per pt report he has 2 STE and no acute sx distress, cues for sequencing as he reports RLE is "good leg"   Wheelchair Mobility    Modified Rankin (Stroke Patients Only)       Balance Overall balance assessment: Needs assistance Sitting-balance support: No upper extremity supported Sitting balance-Leahy Scale: Fair     Standing balance support: Single extremity supported;Bilateral upper extremity supported Standing balance-Leahy Scale: Fair Standing balance comment: able to stand at RW or sink with U UE support but feels more stable using RW for gait                            Cognition Arousal/Alertness: Awake/alert Behavior During Therapy: WFL for tasks assessed/performed Overall Cognitive Status: Within Functional Limits for tasks assessed                                 General Comments: some delayed command following at times unsure if internally distracted vs slow processing.      Exercises Other Exercises Other Exercises: IS x10 reps [phone] mL    General Comments General comments (  skin integrity, edema, etc.): BP 137/78 (94) pre-mobility and no dizziness reported; HR 86-97 bpm with exertion, and SpO2 97% pre-mobilty and 100% post-exertion      Pertinent Vitals/Pain Pain Assessment: Faces Faces Pain Scale: Hurts little more Pain Location: sternal incision site and low back pain Pain Descriptors / Indicators: Grimacing;Guarding;Discomfort;Pressure Pain  Intervention(s): Monitored during session;Limited activity within patient's tolerance;Repositioned;Other (comment) (RN notified pt c/o chest "pressure" toward end of gait trial, no other s/sx distress SpO2 and HR WNL)    Home Living                      Prior Function            PT Goals (current goals can now be found in the care plan section) Acute Rehab PT Goals Patient Stated Goal: independent and active PT Goal Formulation: With patient Time For Goal Achievement: 09/10/20 Potential to Achieve Goals: Good Progress towards PT goals: Progressing toward goals    Frequency    Min 3X/week      PT Plan Current plan remains appropriate       AM-PAC PT "6 Clicks" Mobility   Outcome Measure  Help needed turning from your back to your side while in a flat bed without using bedrails?: A Little Help needed moving from lying on your back to sitting on the side of a flat bed without using bedrails?: A Little Help needed moving to and from a bed to a chair (including a wheelchair)?: A Little Help needed standing up from a chair using your arms (e.g., wheelchair or bedside chair)?: A Little Help needed to walk in hospital room?: A Little Help needed climbing 3-5 steps with a railing? : A Little 6 Click Score: 18    End of Session Equipment Utilized During Treatment: Gait belt Activity Tolerance: Patient tolerated treatment well Patient left: in chair;with call bell/phone within reach Nurse Communication: Mobility status PT Visit Diagnosis: Unsteadiness on feet (R26.81);Other abnormalities of gait and mobility (R26.89);Pain;Difficulty in walking, not elsewhere classified (R26.2) Pain - part of body:  (sternal and LBP)     Time: 3668-1594 PT Time Calculation (min) (ACUTE ONLY): 30 min  Charges:  $Gait Training: 8-22 mins $Therapeutic Activity: 8-22 mins                     Segundo Makela P., PTA Acute Rehabilitation Services Pager: [phone] Office: [place] 08/29/2020, 6:13 PM

## 2020-08-29 NOTE — Progress Notes (Addendum)
      [address]       [place], [address]             [phone]        3 Days Post-Op Procedure(s) (LRB): CORONARY ARTERY BYPASS GRAFTING (CABG), ON PUMP, TIMES FOUR, USING LEFT INTERNAL MAMMARY ARTERY AND RIGHT ENDOSCOPICALLY HARVESTED GREATER SAPHENOUS VEIN (N/A) TRANSESOPHAGEAL ECHOCARDIOGRAM (TEE) (N/A) INDOCYANINE GREEN FLUORESCENCE IMAGING (ICG) (N/A) ENDOVEIN HARVEST OF GREATER SAPHENOUS VEIN (Right)  Subjective: Patient had small bowel movement this am;loose. He has had complaints of sore throat since surgery (bruised back of throat, no thrush secondary to ETT).  Objective: Vital signs in last 24 hours: Temp:  [97.5 F (36.4 C)-99.1 F (37.3 C)] 98.6 F (37 C) (06/12 2259) Pulse Rate:  [73-87] 81 (06/12 2259) Cardiac Rhythm: Normal sinus rhythm;Bundle branch block (06/12 2019) Resp:  [17-32] 17 (06/12 2259) BP: (98-161)/(61-105) 144/76 (06/12 2259) SpO2:  [90 %-100 %] 93 % (06/12 2259) Weight:  [80.2 kg] 80.2 kg (06/13 0413)  Pre op weight  75.4 kg Current Weight  08/29/20 80.2 kg      Intake/Output from previous day: 06/12 0701 - 06/13 0700 In: 68.6 [I.V.:68.6] Out: 1475 [Urine:1475]   Physical Exam:  Cardiovascular: RRR Pulmonary: Crackles Abdomen: Soft, non tender, bowel sounds present. Extremities: Mild bilateral lower extremity edema. Wounds: Aquacel removed and sternal wound is clean and dry.  No erythema or signs of infection.  Lab Results: CBC: Recent Labs    08/28/20 0325 08/29/20 0339  WBC 10.1 11.0*  HGB 8.2* 8.3*  HCT 25.8* 26.3*  PLT 140* 171   BMET:  Recent Labs    08/28/20 0325 08/29/20 0339  NA 136 135  K 4.1 4.0  CL 103 105  CO2 24 24  GLUCOSE 111* 116*  BUN 12 11  CREATININE 1.17 1.06  CALCIUM 8.2* 8.1*    PT/INR:  Lab Results  Component Value Date   INR 1.4 (H) 08/26/2020   INR 1.0 08/25/2020   ABG:  INR: Will add last result for INR, ABG once components are confirmed Will add last 4  CBG results once components are confirmed  Assessment/Plan:  1. CV - S/p NSTEMI. SR with HR in the 80's. On Lopressor 25 mg bid. Will resume low dose Losartan for better BP control. Will likely start Plavix in am 2.  Pulmonary - On room air. PA/LAT CXR this am shows bibasilar atelectasis, small pleural effusions, no pneumothorax. Encourage incentive spirometer 3. Volume Overload - On Lasix 40 mg daily 4.  Expected post op acute blood loss anemia - H and H this am stable at 8.3 and 26.3 5. CBGs 167/111/106. Pre op HGA1C 5.7. Stop accu checks and SS PRN 6. Mild thrombocytopenia resolved as platelets up to 171,000. 7. GU-foley to remain for 1 week as difficulty foley insertion by urologist prior to surgery 8. Remove EPW 9. Stop stool softeners secondary to loose stools 10. Possible discharge 1-2 days  Aiyah Scarpelli M ZimmermanPA-C 08/29/2020,7:09 AM

## 2020-08-30 MED ORDER — CLOPIDOGREL BISULFATE 75 MG PO TABS
75.0000 mg | ORAL_TABLET | Freq: Every day | ORAL | Status: DC
  Administered 2020-08-30 – 2020-08-31 (×2): 75 mg via ORAL
  Filled 2020-08-30 (×2): qty 1

## 2020-08-30 MED ORDER — MENTHOL 3 MG MT LOZG
1.0000 | LOZENGE | OROMUCOSAL | Status: DC | PRN
  Filled 2020-08-30: qty 9

## 2020-08-30 MED ORDER — ASPIRIN EC 81 MG PO TBEC
81.0000 mg | DELAYED_RELEASE_TABLET | Freq: Every day | ORAL | Status: DC
  Administered 2020-08-30 – 2020-08-31 (×2): 81 mg via ORAL
  Filled 2020-08-30 (×2): qty 1

## 2020-08-30 MED ORDER — ASPIRIN 81 MG PO CHEW: 81.0000 mg | CHEWABLE_TABLET | Freq: Every day | ORAL | Status: DC

## 2020-08-30 NOTE — Care Management Important Message (Signed)
Important Message  Patient Details  Name: Tony Shaffer MRN: [id number] Date of Birth: 12-09-1950   Medicare Important Message Given:  Yes     Shelda Altes 08/30/2020, 1:08 PM

## 2020-08-30 NOTE — TOC Transition Note (Addendum)
Transition of Care (TOC) - CM/SW Discharge Note Marvetta Gibbons RN, BSN Transitions of Care Unit 4E- RN Case Manager See Treatment Team for direct phone #    Patient Details  Name: Tony Shaffer MRN: [id number] Date of Birth: August 30, 1950  Transition of Care [facility]) CM/SW Contact:  Dawayne Patricia, RN Phone Number: 08/30/2020, 3:09 PM   Clinical Narrative:    Pt s/p CABG, orders placed for [facility] and DME needs.  CM spoke with pt at bedside regarding transition needs. Per pt he lives at home alone with plan for brother to stay with him post discharge, brother works and will have neighbors and friends check in on him while brother is at work.  Discussed DME and HH recommendations. Pt confirmed that he needs a RW for home, agreeable to use in house provider for DME needs.  List provided for [facility] choice Per CMS guidelines from medicare.gov website with star ratings (copy placed in shadow chart), after review pt has selected [facility] as his first choice, with [facility] as backup should [facility] be unable to accept referral.   Address, phone #s and PCP all confirmed with pt in epic.  Per pt his brother can provide transport home- however not until after 2:30 when he gets off work.   Call made to [employer] for DME need- RW will be delivered to room prior to discharge tomorrow.   Call made to [facility] with [facility] for [place] referral- referral has been accepted.    Final next level of care: [place] Barriers to Discharge: No Barriers Identified   Patient Goals and CMS Choice Patient states their goals for this hospitalization and ongoing recovery are:: return home CMS Medicare.gov Compare Post Acute Care list provided to:: Patient Choice offered to / list presented to : Patient  Discharge Placement                 Home with [facility]      Discharge Plan and Services In-house Referral: NA Discharge Planning Services: CM Consult Post Acute Care Choice: Durable Medical Equipment,  Home Health          DME Arranged: Walker rolling DME Agency: [facility] Date DME Agency Contacted: 08/30/20 Time DME Agency Contacted: 1508 Representative spoke with at DME Agency: Freda Munro HH Arranged: PT [place]: [facility] [place] Date [place]: 08/30/20 Time [place]: 1508 Representative spoke with at [place]: [place] ([place]) Interventions     Readmission Risk Interventions Readmission Risk Prevention Plan 08/30/2020  Post Dischage Appt Complete  Medication Screening Complete  Transportation Screening Complete  Some recent data might be hidden

## 2020-08-30 NOTE — Progress Notes (Signed)
      [address]       [place] [address]             [phone]        4 Days Post-Op Procedure(s) (LRB): CORONARY ARTERY BYPASS GRAFTING (CABG), ON PUMP, TIMES FOUR, USING LEFT INTERNAL MAMMARY ARTERY AND RIGHT ENDOSCOPICALLY HARVESTED GREATER SAPHENOUS VEIN (N/A) TRANSESOPHAGEAL ECHOCARDIOGRAM (TEE) (N/A) INDOCYANINE GREEN FLUORESCENCE IMAGING (ICG) (N/A) ENDOVEIN HARVEST OF GREATER SAPHENOUS VEIN (Right)  Subjective: Patient had small bowel movement this am;loose. He has had complaints of sore throat since surgery (bruised back of throat, no thrush secondary to ETT).  Objective: Vital signs in last 24 hours: Temp:  [98 F (36.7 C)-99.2 F (37.3 C)] 98.4 F (36.9 C) (06/14 0400) Pulse Rate:  [81-96] 82 (06/14 0400) Cardiac Rhythm: Normal sinus rhythm (06/14 0350) Resp:  [17-27] 18 (06/14 0400) BP: (122-157)/(62-81) 145/70 (06/14 0400) SpO2:  [93 %-100 %] 98 % (06/14 0400) Weight:  [77.7 kg] 77.7 kg (06/14 0541)  Pre op weight  75.4 kg Current Weight  08/30/20 77.7 kg      Intake/Output from previous day: 06/13 0701 - 06/14 0700 In: 1260 [P.O.:1260] Out: 4000 [Urine:4000]   Physical Exam:  Cardiovascular: RRR Pulmonary: Crackles Abdomen: Soft, non tender, bowel sounds present. Extremities: Mild bilateral lower extremity edema. Wounds: Aquacel removed and sternal wound is clean and dry.  No erythema or signs of infection.  Lab Results: CBC: Recent Labs    08/28/20 0325 08/29/20 0339  WBC 10.1 11.0*  HGB 8.2* 8.3*  HCT 25.8* 26.3*  PLT 140* 171    BMET:  Recent Labs    08/28/20 0325 08/29/20 0339  NA 136 135  K 4.1 4.0  CL 103 105  CO2 24 24  GLUCOSE 111* 116*  BUN 12 11  CREATININE 1.17 1.06  CALCIUM 8.2* 8.1*     PT/INR:  Lab Results  Component Value Date   INR 1.4 (H) 08/26/2020   INR 1.0 08/25/2020   ABG:  INR: Will add last result for INR, ABG once components are confirmed Will add last 4 CBG results once  components are confirmed  Assessment/Plan:  1. CV - S/p NSTEMI. SR with HR in the 80's and SBP in the 140's.  On Lopressor 25 mg bid and Losartan 25 mg daily. Monitor BP this am as may need to increase Losartan. Will  start Plavix for ACS 2.  Pulmonary - On room air. Encourage incentive spirometer 3. Volume Overload - On Lasix 40 mg daily 4.  Expected post op acute blood loss anemia - H and H this am stable at 8.3 and 26.3 5. Mild thrombocytopenia resolved as platelets up to 171,000. 6. GU-foley to remain for 1 week as difficulty foley insertion by urologist prior to surgery 7. Patient has made arrangements for brother, neighbor and friend to assist him after discharge as he lives alone. He does NOT want to go to a SNF. Likely discharge in am  Janeshia Ciliberto M ZimmermanPA-C 08/30/2020,7:06 AM

## 2020-08-30 NOTE — Progress Notes (Signed)
Mobility Specialist: Progress Note   08/30/20 1216  Mobility  Activity Ambulated in hall  Level of Assistance Modified independent, requires aide device or extra time  Assistive Device Front wheel walker  Distance Ambulated (ft) 750 ft  Mobility Ambulated independently in hallway  Mobility Response Tolerated well  Mobility performed by Mobility specialist  Bed Position Chair  $Mobility charge 1 Mobility   Pre-Mobility: 90 HR, 138/66 BP, 99% SpO2 Post-Mobility: 96 HR, 144/71 BP, 100% SpO2  Pt c/o chest pain during ambulation with pain scale rating ranging from 2-6/10, otherwise asx. Pt back to recliner after walk with call bell at his side.   [facility] Tony Shaffer Mobility Specialist Mobility Specialist Phone: [phone]

## 2020-08-30 NOTE — Progress Notes (Signed)
CARDIAC REHAB PHASE I   PRE:  Rate/Rhythm: 94 SR  BP:  Sitting: 151/67      SaO2: 95 RA  MODE:  Ambulation: 450 ft   POST:  Rate/Rhythm: 107 ST  BP:  Sitting: in BR   Pt ambulated 413ft in hallway standby assist with front wheel walker. Pt denies SOB or dizziness, does endorse intermittent pains, but they do not last. Pt returned to BR. Encouraged continued ambulation and IS use. Pt states intermittent care at home, but has several people he can reach by phone in case of needs. Pt also requesting walker for home use, RN and CM made aware. Will continue to follow.  [id number] Rufina Falco, RN BSN 08/30/2020 9:02 AM

## 2020-08-31 ENCOUNTER — Other Ambulatory Visit: Payer: Self-pay | Admitting: Physician Assistant

## 2020-08-31 MED ORDER — METOPROLOL TARTRATE 37.5 MG PO TABS: 37.5000 mg | ORAL_TABLET | Freq: Two times a day (BID) | ORAL | 1 refills | Status: DC

## 2020-08-31 MED ORDER — CLOPIDOGREL BISULFATE 75 MG PO TABS: 75.0000 mg | ORAL_TABLET | Freq: Every day | ORAL | 1 refills | Status: DC

## 2020-08-31 MED ORDER — FUROSEMIDE 40 MG PO TABS: 40.0000 mg | ORAL_TABLET | Freq: Every day | ORAL | 0 refills | Status: DC

## 2020-08-31 MED ORDER — POTASSIUM CHLORIDE CRYS ER 20 MEQ PO TBCR: 20.0000 meq | EXTENDED_RELEASE_TABLET | Freq: Every day | ORAL | 0 refills | Status: DC

## 2020-08-31 MED ORDER — ATORVASTATIN CALCIUM 40 MG PO TABS: 40.0000 mg | ORAL_TABLET | Freq: Every day | ORAL | 0 refills | Status: DC

## 2020-08-31 MED ORDER — COLCHICINE 0.6 MG PO TABS: 0.3000 mg | ORAL_TABLET | Freq: Two times a day (BID) | ORAL | 0 refills | Status: DC

## 2020-08-31 MED ORDER — METOPROLOL TARTRATE 25 MG PO TABS
37.5000 mg | ORAL_TABLET | Freq: Two times a day (BID) | ORAL | Status: DC
  Administered 2020-08-31: 37.5 mg via ORAL
  Filled 2020-08-31: qty 1

## 2020-08-31 MED ORDER — TRAMADOL HCL 50 MG PO TABS: 50.0000 mg | ORAL_TABLET | Freq: Four times a day (QID) | ORAL | 0 refills | Status: DC | PRN

## 2020-08-31 NOTE — Progress Notes (Signed)
CARDIAC REHAB PHASE I   D/c education completed with pt. Pt educated on importance of site care and monitoring incisions daily. Encouraged continued IS use, walks, and sternal precautions. Pt given in-the-tube sheet along with heart healthy diet. Reviewed restrictions and exercise guidelines. Front wheel walker at bedside. Pt with some concerns regarding foley, RN aware. Will refer to CRP II [place].  [id number] Rufina Falco, RN BSN 08/31/2020 11:59 AM

## 2020-08-31 NOTE — Progress Notes (Signed)
Discharge instructions provided to patient. Medication changes, follow-up appointments, and incision care reviewed. All questions answered. IV removed. Patient to be escorted home by his brother.   Gailen Shelter RN

## 2020-08-31 NOTE — Progress Notes (Signed)
Physical Therapy Treatment Patient Details Name: Tony Shaffer MRN: [id number] DOB: 1950-04-29 Today's Date: 08/31/2020    History of Present Illness pt is a 70 y/o male presenting 34 with radiating CP with activity over the last month.  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  PMHx:  BPH, HTN, GERD    PT Comments    Pt received in supine, agreeable to transfer and gait training, with good participation and tolerance for tasks. Primary emphasis on body mechanics within sternal precautions especially with bed mobility and use of assistive device/incentive spirometry within precautions. Encouraged use of IS post-discharge to maintain lung capacity/endurance and use of pillow for manual splinting. Pt progressed to min guard/Supervision for all tasks and VSS. Pt continues to benefit from PT services to progress toward functional mobility goals.    Follow Up Recommendations  Home health PT;Supervision - Intermittent     Equipment Recommendations  Rolling walker with 5" wheels    Recommendations for Other Services       Precautions / Restrictions Precautions Precautions: Sternal;Fall Precaution Booklet Issued: Yes (comment) Precaution Comments: handout in room Restrictions Weight Bearing Restrictions: No Other Position/Activity Restrictions: sternal precs    Mobility  Bed Mobility Overal bed mobility: Needs Assistance Bed Mobility: Rolling;Sidelying to Sit Rolling: Supervision Sidelying to sit: Min guard       General bed mobility comments: cues for sternal precautions needed, some tactile cues and visual cues provided; fair carryover of log roll instruction    Transfers Overall transfer level: Needs assistance Equipment used: Rolling walker (2 wheeled) Transfers: Sit to/from Stand Sit to Stand: Min guard         General transfer comment: cues for sternal precs, min guard for safety especially with lowering, decreased eccentric control to sit but not totally  uncontrolled.  Ambulation/Gait Ambulation/Gait assistance: Min guard;Supervision Gait Distance (Feet): 100 Feet Assistive device: Rolling walker (2 wheeled);None Gait Pattern/deviations: Step-through pattern     General Gait Details: cues for keeping elbows tucked in and upright posture, pt performed 108ft with RW and Supervision then remainder 43ft with no AD and min guard for safety but no LOB observed   Stairs         General stair comments: pt reports comfortable with stairs from previous session   Wheelchair Mobility    Modified Rankin (Stroke Patients Only)       Balance Overall balance assessment: Needs assistance Sitting-balance support: No upper extremity supported Sitting balance-Leahy Scale: Fair     Standing balance support: Single extremity supported;Bilateral upper extremity supported Standing balance-Leahy Scale: Fair Standing balance comment: no LOB without RW                            Cognition Arousal/Alertness: Awake/alert Behavior During Therapy: WFL for tasks assessed/performed Overall Cognitive Status: Within Functional Limits for tasks assessed                                 General Comments: some slow processing anticipate pt close to baseline, calm/cooperative      Exercises Other Exercises Other Exercises: IS x10 reps 1,[phone] mL    General Comments General comments (skin integrity, edema, etc.): VSS on RA, HR 90's bpm and SpO2 100% on RA; BP 131/76 (92) prior to mobility and no dizziness reported. 1      Pertinent Vitals/Pain Pain Assessment: 0-10 Pain Score: 2  Pain Location: sternal incision site  Pain Descriptors / Indicators: Guarding;Discomfort Pain Intervention(s): Monitored during session;Repositioned;RN gave pain meds during session (pt had tylenol prior to gait trial)     PT Goals (current goals can now be found in the care plan section) Acute Rehab PT Goals Patient Stated Goal: independent  and active PT Goal Formulation: With patient Time For Goal Achievement: 09/10/20 Progress towards PT goals: Progressing toward goals    Frequency    Min 3X/week      PT Plan Current plan remains appropriate   AM-PAC PT "6 Clicks" Mobility   Outcome Measure  Help needed turning from your back to your side while in a flat bed without using bedrails?: A Little Help needed moving from lying on your back to sitting on the side of a flat bed without using bedrails?: A Little Help needed moving to and from a bed to a chair (including a wheelchair)?: A Little Help needed standing up from a chair using your arms (e.g., wheelchair or bedside chair)?: A Little Help needed to walk in hospital room?: A Little Help needed climbing 3-5 steps with a railing? : A Little 6 Click Score: 18    End of Session Equipment Utilized During Treatment: Gait belt Activity Tolerance: Patient tolerated treatment well Patient left: in chair;with call bell/phone within reach Nurse Communication: Mobility status PT Visit Diagnosis: Unsteadiness on feet (R26.81);Other abnormalities of gait and mobility (R26.89);Pain;Difficulty in walking, not elsewhere classified (R26.2) Pain - part of body:  (sternal incision)     Time: 3329-5188 PT Time Calculation (min) (ACUTE ONLY): 14 min  Charges:  $Therapeutic Activity: 8-22 mins                     Rilee Wendling P., PTA Acute Rehabilitation Services Pager: [phone] Office: [place] 08/31/2020, 2:05 PM

## 2020-08-31 NOTE — Op Note (Signed)
CARDIOTHORACIC SURGERY OPERATIVE NOTE  Date of Procedure: 08/26/20  Preoperative Diagnosis: Severe 3-vessel Coronary Artery Disease  Postoperative Diagnosis: Same  Procedure:   Coronary Artery Bypass Grafting x 4  Left Internal Mammary Artery to Distal Left Anterior Descending Coronary Artery, Saphenous Vein Graft to  Posterior Descending Coronary Artery, Saphenous Vein Graft to Obtuse Marginal Branch of Left Circumflex Coronary Artery and diagonal Branch Coronary Artery as a sequenced graft; endoscopic Vein Harvest from right thigh and Lower Leg  Surgeon: B.  Murvin Natal, MD  Assistant: Josie Saunders, PA-C  Anesthesia: General  Operative Findings: Preserved left ventricular systolic function Good quality left internal mammary artery conduit Good quality saphenous vein conduit good quality target vessels for grafting    BRIEF CLINICAL NOTE AND INDICATIONS FOR SURGERY  70 year old man presented with crescendo chest pain.  He underwent evaluation which demonstrated mildly elevated troponin level.  This led to left heart catheterization showing multivessel coronary artery disease including 50% left main disease and proximal 99% LAD lesion.  He had preserved LV function.  He is referred for CABG.  He has been thoroughly evaluated and is considered a good candidate procedure.   DETAILS OF THE OPERATIVE PROCEDURE  Preparation:  The patient is brought to the operating room on the above mentioned date and central monitoring was established by the anesthesia team including placement of Swan-Ganz catheter and radial arterial line. The patient is placed in the supine position on the operating table.  Intravenous antibiotics are administered. General endotracheal anesthesia is induced uneventfully. A Foley catheter is placed.  Baseline transesophageal echocardiogram was performed.  Findings were notable for mildly reduced LV function and intact valvular function  The patient's chest,  abdomen, both groins, and both lower extremities are prepared and draped in a sterile manner. A time out procedure is performed.   Surgical Approach and Conduit Harvest:  A median sternotomy incision was performed and the left internal mammary artery is dissected from the chest wall and prepared for bypass grafting. The left internal mammary artery is notably good quality conduit. Simultaneously, the greater saphenous vein is obtained from the patient's right thigh.  Using endoscopic vein harvest technique. The saphenous vein is notably good quality conduit. After removal of the saphenous vein, the small surgical incisions in the lower extremity are closed with absorbable suture. Following systemic heparinization, the left internal mammary artery was transected distally noted to have excellent flow.   Extracorporeal Cardiopulmonary Bypass and Myocardial Protection:  The pericardium is opened. The ascending aorta is nondiseased in appearance. The ascending aorta and the right atrium are cannulated for cardiopulmonary bypass.  Adequate heparinization is verified.     The entire pre-bypass portion of the operation was notable for stable hemodynamics.  Cardiopulmonary bypass was begun and the surface of the heart is inspected. Distal target vessels are selected for coronary artery bypass grafting. A cardioplegia cannula is placed in the ascending aorta.    The patient is allowed to cool passively to beforeC systemic temperature.  The aortic cross clamp is applied and cold blood cardioplegia is delivered initially in an antegrade fashion through the aortic root.   Iced saline slush is applied for topical hypothermia.  The initial cardioplegic arrest is rapid with early diastolic arrest.  Repeat doses of cardioplegia are administered intermittently throughout the entire cross clamp portion of the operation through the aortic root, and through subsequently placed vein grafts in order to maintain completely  flat electrocardiogram.   Coronary Artery Bypass Grafting:  The  posterior descending branch of the right coronary artery was grafted using a reversed saphenous vein graft in an end-to-side fashion.  At the site of distal anastomosis the target vessel was good quality and measured approximately 1.5 mm in diameter. The  obtuse marginal branch of the left circumflex coronary artery and the diagonal artery were grafted using a reversed saphenous vein graft in a sequenced fashion.  At the site of distal anastomoses the target vessel were good quality and measured approximately 1.5 mm in diameter. The distal left anterior coronary artery was grafted with the left internal mammary artery in an end-to-side fashion.  At the site of distal anastomosis the target vessel was good quality and measured approximately 1.5 mm in diameter. Anastomotic patency and runoff was confirmed with indocyanine green fluorescence imaging (SPY).   All proximal vein graft anastomoses were placed directly to the ascending aorta prior to removal of the aortic cross clamp.  A reanimation dose of cardioplegia was given down the aortic root and the aortic cross-clamp was then removed.   Procedure Completion:  All proximal and distal coronary anastomoses were inspected for hemostasis and appropriate graft orientation. Epicardial pacing wires are fixed to the right ventricular outflow tract and to the right atrial appendage. The patient is rewarmed to 37C temperature. The patient is weaned and disconnected from cardiopulmonary bypass.  The patient's rhythm at separation from bypass was sinus bradycardia.  The patient was weaned from cardiopulmonary bypass  without any inotropic support.   Followup transesophageal echocardiogram performed after separation from bypass revealed  no changes from the preoperative exam.  The aortic and venous cannula were removed uneventfully. Protamine was administered to reverse the anticoagulation. The  mediastinum and pleural space were inspected for hemostasis and irrigated with saline solution. The mediastinum and the left pleural space were drained using fluted chest tubes placed through separate stab incisions inferiorly.  The soft tissues anterior to the aorta were reapproximated loosely. The sternum is closed with double strength sternal wire. The soft tissues anterior to the sternum were closed in multiple layers and the skin is closed with a running subcuticular skin closure.  The post-bypass portion of the operation was notable for stable rhythm and hemodynamics.     Disposition:  The patient tolerated the procedure well and is transported to the surgical intensive care in stable condition. There are no intraoperative complications. All sponge instrument and needle counts are verified correct at completion of the operation.    B.  MD 08/31/2020 8:39 AM

## 2020-08-31 NOTE — Progress Notes (Signed)
      [address]       [place] [address]             [phone]        5 Days Post-Op Procedure(s) (LRB): CORONARY ARTERY BYPASS GRAFTING (CABG), ON PUMP, TIMES FOUR, USING LEFT INTERNAL MAMMARY ARTERY AND RIGHT ENDOSCOPICALLY HARVESTED GREATER SAPHENOUS VEIN (N/A) TRANSESOPHAGEAL ECHOCARDIOGRAM (TEE) (N/A) INDOCYANINE GREEN FLUORESCENCE IMAGING (ICG) (N/A) ENDOVEIN HARVEST OF GREATER SAPHENOUS VEIN (Right)  Subjective: Patient eating breakfast this am. He wants to go home.  Objective: Vital signs in last 24 hours: Temp:  [97.9 F (36.6 C)-99.1 F (37.3 C)] 97.9 F (36.6 C) (06/15 0345) Pulse Rate:  [87-100] 87 (06/15 0345) Cardiac Rhythm: Normal sinus rhythm (06/15 0300) Resp:  [19-20] 20 (06/15 0345) BP: (136-165)/(66-82) 138/73 (06/15 0345) SpO2:  [97 %-100 %] 100 % (06/15 0345) Weight:  [76.7 kg] 76.7 kg (06/15 0345)  Pre op weight  75.4 kg Current Weight  08/31/20 76.7 kg      Intake/Output from previous day: 06/14 0701 - 06/15 0700 In: -  Out: 1825 [Urine:1825]   Physical Exam:  Cardiovascular: RRR Pulmonary: Crackles Abdomen: Soft, non tender, bowel sounds present. Extremities: Tracebilateral lower extremity edema. Wounds: Sternal wound is clean and dry.  No erythema or signs of infection. RLE wound is clean and dry  Lab Results: CBC: Recent Labs    08/29/20 0339  WBC 11.0*  HGB 8.3*  HCT 26.3*  PLT 171    BMET:  Recent Labs    08/29/20 0339  NA 135  K 4.0  CL 105  CO2 24  GLUCOSE 116*  BUN 11  CREATININE 1.06  CALCIUM 8.1*     PT/INR:  Lab Results  Component Value Date   INR 1.4 (H) 08/26/2020   INR 1.0 08/25/2020   ABG:  INR: Will add last result for INR, ABG once components are confirmed Will add last 4 CBG results once components are confirmed  Assessment/Plan:  1. CV - S/p NSTEMI. SR with HR in the 90's and SBP in the 130-140's.  On Lopressor 25 mg bid and Losartan 25 mg daily, and  Plavix for  ACS. Will increase Lopressor to 37.5 mg bid for better HR control 2.  Pulmonary - On room air. Encourage incentive spirometer 3. Volume Overload - On Lasix 40 mg daily 4.  Expected post op acute blood loss anemia - H and H this am stable at 8.3 and 26.3 5 GU-foley to remain for 1 week as difficulty foley insertion by urologist prior to surgery. I have arranged appointment for foley to be removed and voiding on trail on Friday 06/17 6. Patient has made arrangements for brother, neighbor and friend to assist him after discharge as he lives alone. He does NOT want to go to a SNF. Discharge  Tony Shaffer M ZimmermanPA-C 08/31/2020,7:06 AM

## 2020-09-01 ENCOUNTER — Encounter (HOSPITAL_COMMUNITY): Payer: Self-pay | Admitting: Cardiothoracic Surgery

## 2020-09-01 MED FILL — Sodium Chloride IV Soln 0.9%: INTRAVENOUS | Qty: 5000 | Status: AC

## 2020-09-01 MED FILL — Electrolyte-R (PH 7.4) Solution: INTRAVENOUS | Qty: 4000 | Status: AC

## 2020-09-01 MED FILL — Mannitol IV Soln 20%: INTRAVENOUS | Qty: 500 | Status: AC

## 2020-09-01 MED FILL — Heparin Sodium (Porcine) Inj 1000 Unit/ML: INTRAMUSCULAR | Qty: 10 | Status: AC

## 2020-09-01 MED FILL — Lidocaine HCl Local Soln Prefilled Syringe 100 MG/5ML (2%): INTRAMUSCULAR | Qty: 5 | Status: AC

## 2020-09-01 MED FILL — Sodium Bicarbonate IV Soln 8.4%: INTRAVENOUS | Qty: 50 | Status: AC

## 2020-09-01 NOTE — OR Nursing (Signed)
After reviewing MD post-op notes, the chart was corrected to reflect the use of Indocyanine green imaging by surgeon.

## 2020-09-08 ENCOUNTER — Telehealth (HOSPITAL_COMMUNITY): Payer: Self-pay

## 2020-09-08 ENCOUNTER — Ambulatory Visit: Payer: Medicare HMO | Admitting: Cardiothoracic Surgery

## 2020-09-08 NOTE — Telephone Encounter (Signed)
Called patient to see if he is interested in the Cardiac Rehab Program. Patient expressed interest. Explained scheduling process and went over insurance, patient verbalized understanding. Will contact patient for scheduling once f/u has been completed.  °

## 2020-09-08 NOTE — Telephone Encounter (Signed)
Pt insurance is active and benefits verified through [place] $10, DED 0/0 met, out of pocket $3,900/$20.16 met, co-insurance 0%. no pre-authorization required. Passport, 09/08/2020_0 :25am, REF# [id number]   Will contact patient to see if he is interested in the Cardiac Rehab Program. If interested, patient will need to complete follow up appt. Once completed, patient will be contacted for scheduling upon review by the RN Navigator.

## 2020-09-12 ENCOUNTER — Telehealth: Payer: Self-pay | Admitting: *Deleted

## 2020-09-12 NOTE — Telephone Encounter (Signed)
Tony Shaffer contacted the office with concerns of possible reaction to atorvastatin. Per patient he contacted his PCP who suggested he stop the medication but to contact our office for further guidance. Patient states he has been taking atorvastatin since discharge and noticed dizziness, lightheadedness, nausea, diarrhea, and body aches after taking the medication. Patient states he has since stopped taking the medication and feels better. Per Josie Saunders, PA, patient advised to continue to not take atorvastatin until he follows up with cardiology on Friday. Patient acknowledges receipt and verbalizes understanding.

## 2020-09-16 ENCOUNTER — Other Ambulatory Visit: Payer: Self-pay

## 2020-09-16 ENCOUNTER — Encounter: Payer: Self-pay | Admitting: Family

## 2020-09-16 ENCOUNTER — Ambulatory Visit (INDEPENDENT_AMBULATORY_CARE_PROVIDER_SITE_OTHER): Payer: Medicare HMO | Admitting: Family

## 2020-09-16 VITALS — BP 120/80 | HR 100 | Ht 69.0 in | Wt 161.0 lb

## 2020-09-16 DIAGNOSIS — Z951 Presence of aortocoronary bypass graft: Secondary | ICD-10-CM

## 2020-09-16 DIAGNOSIS — I4510 Unspecified right bundle-branch block: Secondary | ICD-10-CM | POA: Diagnosis not present

## 2020-09-16 DIAGNOSIS — I25118 Atherosclerotic heart disease of native coronary artery with other forms of angina pectoris: Secondary | ICD-10-CM

## 2020-09-16 DIAGNOSIS — I444 Left anterior fascicular block: Secondary | ICD-10-CM

## 2020-09-16 DIAGNOSIS — I6523 Occlusion and stenosis of bilateral carotid arteries: Secondary | ICD-10-CM

## 2020-09-16 DIAGNOSIS — K219 Gastro-esophageal reflux disease without esophagitis: Secondary | ICD-10-CM

## 2020-09-16 DIAGNOSIS — E785 Hyperlipidemia, unspecified: Secondary | ICD-10-CM

## 2020-09-16 LAB — CBC
Hematocrit: 34.5 % — ABNORMAL LOW (ref 37.5–51.0)
Hemoglobin: 11.1 g/dL — ABNORMAL LOW (ref 13.0–17.7)
MCH: 27.3 pg (ref 26.6–33.0)
MCHC: 32.2 g/dL (ref 31.5–35.7)
MCV: 85 fL (ref 79–97)
Platelets: 766 10*3/uL — ABNORMAL HIGH (ref 150–450)
RBC: 4.06 x10E6/uL — ABNORMAL LOW (ref 4.14–5.80)
RDW: 13.9 % (ref 11.6–15.4)
WBC: 12.2 10*3/uL — ABNORMAL HIGH (ref 3.4–10.8)

## 2020-09-16 LAB — BASIC METABOLIC PANEL
BUN/Creatinine Ratio: 10 (ref 10–24)
BUN: 10 mg/dL (ref 8–27)
CO2: 18 mmol/L — ABNORMAL LOW (ref 20–29)
Calcium: 10 mg/dL (ref 8.6–10.2)
Chloride: 98 mmol/L (ref 96–106)
Creatinine, Ser: 1.02 mg/dL (ref 0.76–1.27)
Glucose: 103 mg/dL — ABNORMAL HIGH (ref 65–99)
Potassium: 5.5 mmol/L — ABNORMAL HIGH (ref 3.5–5.2)
Sodium: 134 mmol/L (ref 134–144)
eGFR: 79 mL/min/{1.73_m2} (ref >59)

## 2020-09-16 MED ORDER — ROSUVASTATIN CALCIUM 20 MG PO TABS: 20.0000 mg | ORAL_TABLET | Freq: Every day | ORAL | 5 refills | Status: DC

## 2020-09-16 MED ORDER — CLOPIDOGREL BISULFATE 75 MG PO TABS: 75.0000 mg | ORAL_TABLET | Freq: Every day | ORAL | 1 refills | Status: DC

## 2020-09-16 MED ORDER — METOPROLOL TARTRATE 50 MG PO TABS: 50.0000 mg | ORAL_TABLET | Freq: Two times a day (BID) | ORAL | 5 refills | Status: DC

## 2020-09-16 MED ORDER — LOSARTAN POTASSIUM 25 MG PO TABS: 25.0000 mg | ORAL_TABLET | Freq: Every day | ORAL | 3 refills | Status: DC

## 2020-09-16 NOTE — Patient Instructions (Addendum)
Medication Instructions:  Your physician has recommended you make the following change in your medication:   Your physician has recommended you make the following change in your medication:   STOP Atorvastatin  START Rosuvastatin 20mg  one tablet daily  CHANGE Metoprolol Tartrate 50mg  one tablet twice daily   You no longer need Furosemide or Potassium.   You may try over the counter Zyrtec (cetirizine) once per day.   *If you need a refill on your cardiac medications before your next appointment, please call your pharmacy*   Lab Work: Your physician recommends that you return for lab work today: CBC, BMP  If you have labs (blood work) drawn today and your tests are completely normal, you will receive your results only by: [place] (if you have [place]) OR A paper copy in the mail If you have any lab test that is abnormal or we need to change your treatment, we will call you to review the results.   Testing/Procedures: None ordered today.    Follow-Up: At [facility], you and your health needs are our priority.  As part of our continuing mission to provide you with exceptional heart care, we have created designated Provider Care Teams.  These Care Teams include your primary Cardiologist (physician) and Advanced Practice Providers (APPs -  Physician Assistants and Nurse Practitioners) who all work together to provide you with the care you need, when you need it.  We recommend signing up for the patient portal called "MyChart".  Sign up information is provided on this After Visit Summary.  MyChart is used to connect with patients for Virtual Visits (Telemedicine).  Patients are able to view lab/test results, encounter notes, upcoming appointments, etc.  Non-urgent messages can be sent to your provider as well.   To learn more about what you can do with MyChart, go to [email or web address].    Your next appointment:   2 month(s)  12/23/2020 arirve at 8:45  The format for  your next appointment:   In Person  Provider:   You may see Jenkins Rouge, MD or one of the following Advanced Practice Providers on your designated Care Team:   Kathyrn Drown, NP   Other Instructions  Heart Healthy Diet Recommendations: A low-salt diet is recommended. Meats should be grilled, baked, or boiled. Avoid fried foods. Focus on lean protein sources like fish or chicken with vegetables and fruits. The American Heart Association is a Microbiologist!  American Heart Association Diet and Lifeystyle Recommendations   Exercise recommendations: The American Heart Association recommends 150 minutes of moderate intensity exercise weekly. Try 30 minutes of moderate intensity exercise 4-5 times per week. This could include walking, jogging, or swimming.  For coronary artery disease often called "heart disease" we aim for optimal guideline directed medical therapy. We use the "A, B, C"s to help keep [place] on track!  A = Aspirin 81mg  daily B = Beta blocker which helps to relax the heart. This is your Metoprolol. C = Cholesterol control. You take Rosuvastatin to help control your cholesterol.  And then extras! In your case, this is Clopidogrel (Plavix) to help protect your vein grafts.

## 2020-09-16 NOTE — Progress Notes (Signed)
Office Visit    Patient Name: Tony Shaffer Date of Encounter: 09/16/2020  PCP:  Sonia Side., [facility]  Cardiologist:  Jenkins Rouge, MD  Advanced Practice Provider:  No care team member to display Electrophysiologist:  None   Chief Complaint    Tony Shaffer is a 70 y.o. male with a hx of BPH, CAD s/p CABG, RBBB, LAFB, bilateral carotid artery stenosis (08/2020 1-39%), hypertension presents today for followup after CABG.   Past Medical History    Past Medical History:  Diagnosis Date   BPH (benign prostatic hypertrophy)    URINARY FREQUENCY AND NOCTURIA   GERD (gastroesophageal reflux disease)    NOTICES GERD WHEN SLEEPING - WAKES HIM UP IF HE EATS SPICY FOODS   Hypertension    NGB (new growth of bladder)    PT STATES HE WAS TOLD HE HAS GROWTH IN HIS BLADDER   Nocturnal leg cramps    NOT MUCH OF A PROBLEM SINCE HIS DOCTOR CHANGED HIS MEDICATION   Pain    SHARP PAIN IN RT GROIN AT TIMES - PT DOES NOT KNOW THE CAUSE OF THE PAIN   Seasonal allergies    Past Surgical History:  Procedure Laterality Date   CORONARY ARTERY BYPASS GRAFT N/A 08/26/2020   Procedure: CORONARY ARTERY BYPASS GRAFTING (CABG), ON PUMP, TIMES FOUR, USING LEFT INTERNAL MAMMARY ARTERY AND RIGHT ENDOSCOPICALLY HARVESTED GREATER SAPHENOUS VEIN;  Surgeon: Wonda Olds, MD;  Location: [facility];  Service: Open Heart Surgery;  Laterality: N/A;   ENDOVEIN HARVEST OF GREATER SAPHENOUS VEIN Right 08/26/2020   Procedure: ENDOVEIN HARVEST OF GREATER SAPHENOUS VEIN;  Surgeon: Wonda Olds, MD;  Location: [facility];  Service: Open Heart Surgery;  Laterality: Right;   LEFT HEART CATH AND CORONARY ANGIOGRAPHY N/A 08/24/2020   Procedure: LEFT HEART CATH AND CORONARY ANGIOGRAPHY;  Surgeon: Burnell Blanks, MD;  Location: [facility];  Service: Cardiovascular;  Laterality: N/A;   PROSTATECTOMY N/A 10/17/2012   Procedure: TRANSVESICLE PROSTATECTOMY SUPRAPUBIC;  Surgeon:  Ailene Rud, MD;  Location: [facility];  Service: Urology;  Laterality: N/A;   TEE WITHOUT CARDIOVERSION N/A 08/26/2020   Procedure: TRANSESOPHAGEAL ECHOCARDIOGRAM (TEE);  Surgeon: Wonda Olds, MD;  Location: [place] [facility];  Service: Open Heart Surgery;  Laterality: N/A;   WISDOM TOOTH EXTRACTION      Allergies  No Known Allergies  History of Present Illness    Tony Shaffer is a 70 y.o. male with a hx of BPH, CAD s/p CABG, RBBB, LAFB, bilateral carotid artery stenosis (08/2020 1-39%), hypertension  last seen while hospitalized.  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 He was initially evaluated 11/26/19 by Dr. Audie Box due to right bundle branch block. He was without ischemic symptoms. Echocardiogram was recommended as well as improved BP control. Echo 11/2019 with normal LVEF and no significant valvular abnormalities.   He was admitted 08/23/20 with unstable angina with HS troponin 52 ? 85. Underwent LHC 08/24/20 showing ostial to proximal circumflex stenosis 40%, distal circumflex lesion 40%, ostial to mid left main stenosis 50%, ostial to proximal LAD lesion 99%, LVEF 65%.  He was recommended for CABG.  He underwent CABG x4 08/26/20.  He did require Foley catheter due to urinary retention and was discharged with Foley catheter after failing voiding trial.  Postoperative course was otherwise unremarkable.  He was discharged home 6-15/2022  He presents today for follow-up with his friend Marchia Bond.  His  chief complaint today is nonproductive cough which he associates with nasal drip.  He has been compliant with his FlonNse.  Encouraged trial addition of antihistamine such as Zyrtec.  He has been using his incentive spirometer regularly. He is walking 5 minutes about three times per day.  Does try to walk with his dog. Also working with [facility] PT. When he first got home he tells me he has lots of musculoskeletal pain but this has overall improved no recurrent angina. He did have a repeat voiding trial with urology  since discharge and foley catheter was replaced, has upcoming follow up for re-trial. Notes palpitations predominantly when he lays down for sleep. Tells me he can hear his heart rate in his ear. He stopped Atorvasatin as it was making him dizzy, he has had no recurrent dizziness since discontinuation. We discussed that this is an atypical side effect.  EKGs/Labs/Other Studies Reviewed:   The following studies were reviewed today:  Echo 08/25/20  1. Left ventricular ejection fraction, by estimation, is 55 to 60%. The  left ventricle has normal function. The left ventricle demonstrates  regional wall motion abnormalities with apical hypokinesis. Left  ventricular diastolic parameters are consistent  with Grade I diastolic dysfunction (impaired relaxation).   2. Right ventricular systolic function is normal. The right ventricular  size is normal. Tricuspid regurgitation signal is inadequate for assessing  PA pressure.   3. The mitral valve is normal in structure. No evidence of mitral valve  regurgitation. No evidence of mitral stenosis.   4. The aortic valve is tricuspid. Aortic valve regurgitation is not  visualized. Mild aortic valve sclerosis is present, with no evidence of  aortic valve stenosis.   5. The IVC was not visualized.  VAS [place] pre CABG 08/25/20 Summary:  Right Carotid: Velocities in the right ICA are consistent with a 1-39%  stenosis.   Left Carotid: Velocities in the left ICA are consistent with a 1-39%  stenosis.  Vertebrals: Bilateral vertebral arteries demonstrate antegrade flow.  Subclavians: Normal flow hemodynamics were seen in bilateral subclavian               arteries.  LHC 08/24/20  Ost Cx to Prox Cx lesion is 40% stenosed. Dist Cx lesion is 40% stenosed. Ost LM to Mid LM lesion is 50% stenosed. Ost LAD to Prox LAD lesion is 99% stenosed. The left ventricular systolic function is normal. LV end diastolic pressure is normal. The left ventricular ejection  fraction is greater than 65% by visual estimate. There is no mitral valve regurgitation.   1. Moderate distal left main stenosis 2. Severe, heavily calcified ostial/proximal LAD stenosis. This is a functional chronic occlusion. The LAD also fills from right to left collaterals through a small, early branch. 3. Mild disease in the dominant Circumflex 4. Small non-dominant RCA 5. Normal LV systolic function   Recommendations: We will ask  CT surgery to see him tomorrow to review candidacy for bypass surgery. I think CABG is the best option for revascularization. I have reviewed the films with Dr. Fletcher Anon as well. Continue ASA and statin. Will restart IV heparin 6 hours post sheath pull. EKG:  EKG is ordered today.  The ekg ordered today demonstrates normal sinus rhythm 100bpm, RBBB and LAFB have returned since last tracing. Stable TWI in V2. No acute St/T wave changes.   Recent Labs: 08/23/2020: ALT 32; B Natriuretic Peptide 56.6 08/27/2020: Magnesium 2.4 08/29/2020: BUN 11; Creatinine, Ser 1.06; Hemoglobin 8.3; Platelets 171; Potassium 4.0; Sodium  135  Recent Lipid Panel    Component Value Date/Time   CHOL 201 (H) 08/24/2020 2022   TRIG 131 08/24/2020 2022   HDL 53 08/24/2020 2022   CHOLHDL 3.8 08/24/2020 2022   VLDL 26 08/24/2020 2022   LDLCALC 122 (H) 08/24/2020 2022    Home Medications   Current Meds  Medication Sig   aspirin EC 81 MG tablet Take 81 mg by mouth daily. Swallow whole.   calcium-vitamin D (OSCAL WITH D) 500-200 MG-UNIT tablet Take 1 tablet by mouth.   clopidogrel (PLAVIX) 75 MG tablet Take 1 tablet (75 mg total) by mouth daily.   colchicine 0.6 MG tablet Take 0.5 tablets (0.3 mg total) by mouth 2 (two) times daily.   fluticasone (FLONASE) 50 MCG/ACT nasal spray Place 2 sprays into both nostrils 2 (two) times daily.   losartan (COZAAR) 25 MG tablet Take 25 mg by mouth daily.   pantoprazole (PROTONIX) 40 MG tablet Take 1 tablet by mouth 2 (two) times daily.   tiZANidine  (ZANAFLEX) 4 MG tablet Take 4 mg by mouth every 8 (eight) hours as needed.   traMADol (ULTRAM) 50 MG tablet Take 1 tablet (50 mg total) by mouth every 6 (six) hours as needed for moderate pain.   [DISCONTINUED] atorvastatin (LIPITOR) 40 MG tablet Take 1 tablet (40 mg total) by mouth daily.   [DISCONTINUED] furosemide (LASIX) 40 MG tablet Take 1 tablet (40 mg total) by mouth daily.   [DISCONTINUED] Metoprolol Tartrate 37.5 MG TABS Take 37.5 mg by mouth 2 (two) times daily.   [DISCONTINUED] potassium chloride (KLOR-CON) 20 MEQ tablet Take 1 tablet (20 mEq total) by mouth daily. For 5 days then stop.     Review of Systems    All other systems reviewed and are otherwise negative except as noted above.  Physical Exam    VS:  BP 120/80   Pulse 100   Ht 5\' 9"  (1.753 m)   Wt 161 lb (73 kg)   SpO2 99%   BMI 23.78 kg/m  , BMI Body mass index is 23.78 kg/m.  Wt Readings from Last 3 Encounters:  09/16/20 161 lb (73 kg)  08/31/20 169 lb 1.6 oz (76.7 kg)  11/26/19 173 lb 12.8 oz (78.8 kg)    GEN: Well nourished, well developed, in no acute distress. HEENT: normal. Neck: Supple, no JVD, carotid bruits, or masses. Cardiac: RRR, no murmurs, rubs, or gallops. No clubbing, cyanosis, edema.  Radials/DP/PT 2+ and equal bilaterally.  Respiratory:  Respirations regular and unlabored, clear to auscultation bilaterally. GI: Soft, nontender, nondistended. MS: No deformity or atrophy. Skin: Warm and dry, no rash. Midsternal incision with edges well approximated. Chest tube suture sites with steri strips. No exudate, erythema, signs of infection.  Neuro:  Strength and sensation are intact. Psych: Normal affect.  Assessment & Plan    CAD s/p CABG - Stable with no anginal symptoms. No indication for ischemic evaluation. EKG today with stable RBBB and LAFB. No acute ST/T wave changes. Completed few day course of Lasix/Potassium after discharge - no evidence of volume overload requiring repeat ciuresis.   GDMT Aspirin, Clopidogrel, Metoprolol, Rosuvastatin. Refills provided. Plan to complete current course of Colchicine. Increase Metoprolol Tartrate to 50mg  BID due to tachycardia. Heart healthy diet and regular cardiovascular exercise encouraged. Encouraged to participate with cardiac rehab. BMP, CBC today for monitoring.   HTN - BP well controlled. Continue current antihypertensive regimen.    GERD - Continue Protonix.   HLD, LDL goal <70 -  Intolerance to Atorvastatin with dizziness. 08/24/20 LDL 122. Start Crestor 20mg  daily. Plan to repeat lipid panel and CMP at follow up.   Carotid artery disease - 08/2020 bilateral 1-39% ICA stenosis. Continue aspirin, statin.   Disposition: Follow up in 2 month(s) with Dr. Johnsie Cancel   Signed, Loel Dubonnet, NP 09/16/2020, 11:59 AM [place]

## 2020-09-20 ENCOUNTER — Telehealth: Payer: Self-pay

## 2020-09-20 DIAGNOSIS — E875 Hyperkalemia: Secondary | ICD-10-CM

## 2020-09-20 NOTE — Telephone Encounter (Signed)
-----   Message from Loel Dubonnet, NP sent at 09/20/2020  6:53 AM EDT ----- Normal renal function. Potassium elevated - please ensure not taking potassium supplement or salt substitute or drinking electrolyte drink.   Recommend repeat BMP today or tomorrow. Recommend hold Losartan until repeat BMP obtained.   CBC overall stable. Postoperative anemia improving.

## 2020-09-20 NOTE — Telephone Encounter (Signed)
Notified pt and his brother of results and provider recommendations.  Pt expresses that he will stop eating bananas and sports drinks.  He does not use salt substitutes or potassium supplements.  I informed him to stop taking losartan at this time.  Losartan will be reviewed after BMP is obtained.  I did not remove losartan from med list at this time.

## 2020-09-21 ENCOUNTER — Other Ambulatory Visit: Payer: Medicare HMO

## 2020-09-21 ENCOUNTER — Other Ambulatory Visit: Payer: Self-pay | Admitting: Cardiothoracic Surgery

## 2020-09-21 ENCOUNTER — Other Ambulatory Visit: Payer: Self-pay

## 2020-09-21 DIAGNOSIS — E875 Hyperkalemia: Secondary | ICD-10-CM

## 2020-09-21 DIAGNOSIS — Z951 Presence of aortocoronary bypass graft: Secondary | ICD-10-CM

## 2020-09-21 LAB — BASIC METABOLIC PANEL
BUN/Creatinine Ratio: 14 (ref 10–24)
BUN: 15 mg/dL (ref 8–27)
CO2: 22 mmol/L (ref 20–29)
Calcium: 9.9 mg/dL (ref 8.6–10.2)
Chloride: 95 mmol/L — ABNORMAL LOW (ref 96–106)
Creatinine, Ser: 1.11 mg/dL (ref 0.76–1.27)
Glucose: 118 mg/dL — ABNORMAL HIGH (ref 65–99)
Potassium: 5.3 mmol/L — ABNORMAL HIGH (ref 3.5–5.2)
Sodium: 133 mmol/L — ABNORMAL LOW (ref 134–144)
eGFR: 71 mL/min/{1.73_m2} (ref >59)

## 2020-09-21 NOTE — Progress Notes (Unsigned)
cxr 

## 2020-09-22 ENCOUNTER — Ambulatory Visit
Admission: RE | Admit: 2020-09-22 | Discharge: 2020-09-22 | Disposition: A | Discharge status: Home / Self Care | Payer: Medicare HMO | Source: Ambulatory Visit | Attending: Cardiothoracic Surgery | Admitting: Cardiothoracic Surgery

## 2020-09-22 ENCOUNTER — Ambulatory Visit (INDEPENDENT_AMBULATORY_CARE_PROVIDER_SITE_OTHER): Payer: Self-pay | Admitting: Cardiothoracic Surgery

## 2020-09-22 VITALS — BP 122/79 | HR 90 | Resp 20 | Ht 69.0 in | Wt 157.0 lb

## 2020-09-22 DIAGNOSIS — Z951 Presence of aortocoronary bypass graft: Secondary | ICD-10-CM

## 2020-09-22 NOTE — Progress Notes (Signed)
      [address]       [place] [address]             [phone]     CARDIOTHORACIC SURGERY OFFICE NOTE  Referring Provider is Josue Hector, MD Primary Cardiologist is Jenkins Rouge, MD PCP is Sonia Side., FNP   HPI:  70 year old man presents for outpatient visit status post CABG on 08/26/2020.  He has done very well with his only difficulty being urinary retention and a history of prostate disease.  He is continuing to use an indwelling catheter.  Denies chest pain or shortness of breath.   Current Outpatient Medications  Medication Sig Dispense Refill   aspirin EC 81 MG tablet Take 81 mg by mouth daily. Swallow whole.     calcium-vitamin D (OSCAL WITH D) 500-200 MG-UNIT tablet Take 1 tablet by mouth.     clopidogrel (PLAVIX) 75 MG tablet Take 1 tablet (75 mg total) by mouth daily. 90 tablet 1   fluticasone (FLONASE) 50 MCG/ACT nasal spray Place 2 sprays into both nostrils 2 (two) times daily.     metoprolol tartrate (LOPRESSOR) 50 MG tablet Take 1 tablet (50 mg total) by mouth 2 (two) times daily. 30 tablet 5   pantoprazole (PROTONIX) 40 MG tablet Take 1 tablet by mouth 2 (two) times daily.     rosuvastatin (CRESTOR) 20 MG tablet Take 1 tablet (20 mg total) by mouth daily. 30 tablet 5   tiZANidine (ZANAFLEX) 4 MG tablet Take 4 mg by mouth every 8 (eight) hours as needed.     traMADol (ULTRAM) 50 MG tablet Take 1 tablet (50 mg total) by mouth every 6 (six) hours as needed for moderate pain. 28 tablet 0   No current facility-administered medications for this visit.      Physical Exam:   BP 122/79   Pulse 90   Resp 20   Ht 5\' 9"  (1.753 m)   Wt 71.2 kg   SpO2 96% Comment: RA  BMI 23.18 kg/m   General:  Well-appearing no acute distress  Chest:   Clear to auscultation bilaterally  CV:   Regular rate and rhythm  Incisions:  Well-healed  Abdomen:  Soft nontender  Extremities:  No edema  Diagnostic Tests:  Chest x-ray from today with clear  lung fields and stable mediastinum   Impression:  Doing well after CABG  Plan:  Follow-up as needed Okay to DC colchicine and losartan  I spent in excess of 10 minutes during the conduct of this office consultation and >50% of this time involved direct face-to-face encounter with the patient for counseling and/or coordination of their care.  Level 2                 10 minutes Level 3                 15 minutes Level 4                 25 minutes Level 5                 40 minutes  B.  Murvin Natal, MD 09/22/2020 2:02 PM

## 2020-09-23 ENCOUNTER — Telehealth (HOSPITAL_COMMUNITY): Payer: Self-pay | Admitting: *Deleted

## 2020-09-23 NOTE — Telephone Encounter (Signed)
Tony Shaffer completed both his follow up with CVTS and cardiology s/p CABG 6/10.  Unclear in the progress note if pt is continuing to see HHPT and plans for follow up with urology.  Unable to determine his readiness to participate in group exercise at Cardiac rehab.  Called and was unable to leave a voicemail due to his mailbox is full.  Able to send SMS text with the phone number for cardiac rehab.  Await return call. [name], BSN Cardiac and Training and development officer

## 2020-09-27 ENCOUNTER — Other Ambulatory Visit: Payer: Self-pay | Admitting: Physician Assistant

## 2020-09-27 ENCOUNTER — Telehealth: Payer: Self-pay

## 2020-09-27 NOTE — Telephone Encounter (Signed)
Patient contacted the office asking if he was allowed to drive. He stated, when asked, that he was still taking pain medication. Advised that he would not be cleared to drive until he has stopped all pain medications. Advised that he was cleared from a surgical standpoint. Went into detail about medications that he should/ shouldn't be taking and that for future refills, he would need to contact his Cardiology office. He acknowledged receipt.

## 2020-10-11 ENCOUNTER — Telehealth: Payer: Self-pay

## 2020-10-11 NOTE — Telephone Encounter (Signed)
Denny Peon, PT with [facility] contacted the office to state that patient will be discharged from home health today as he has met all his requirements. She stated that he is also ready for outpatient Cardiac Rehab.   Patient has been referred to Cardiac rehab and awaiting spot.  She also stated that patient does have two spots on his sternal incision that have sutures protruding. She stated that it does not look infected, but that it is irritating him.   Spoke with Mr. Westling directly, he stated that he would wait to hear from Cardiac Rehab for appointment dates. Advised that patient could come into the office to have the internal protruding sutures trimmed, but that it was not required. Advised that we could make appointment. He stated that he would wait. Advised also to give the office a call back if he started to notice any signs/ symptoms of infection in those areas. He acknowledged receipt.

## 2020-10-15 ENCOUNTER — Other Ambulatory Visit: Payer: Self-pay | Admitting: Physician Assistant

## 2020-10-31 ENCOUNTER — Other Ambulatory Visit: Payer: Self-pay | Admitting: Physician Assistant

## 2020-11-02 ENCOUNTER — Telehealth (HOSPITAL_COMMUNITY): Payer: Self-pay

## 2020-11-02 ENCOUNTER — Encounter (HOSPITAL_COMMUNITY): Payer: Self-pay

## 2020-11-02 NOTE — Telephone Encounter (Signed)
Attempted to call patient in regards to Cardiac Rehab - LM on VM Mailed letter 

## 2020-11-03 ENCOUNTER — Telehealth (HOSPITAL_COMMUNITY): Payer: Self-pay | Admitting: *Deleted

## 2020-11-03 NOTE — Telephone Encounter (Signed)
Tony Shaffer returned call from message left on 8/17.  Pt hs completed physical therapy about 2 weeks ago.  Pt still has the foley leg bag and complains that it is painful when he moves. Pt has upcoming urology appt on tomorrow.  Unable to see office notes asked for call back on tomorrow for update.  Pt is hopeful the foley can come out. [name], BSN Cardiac and Training and development officer

## 2020-11-07 ENCOUNTER — Telehealth (HOSPITAL_COMMUNITY): Payer: Self-pay | Admitting: *Deleted

## 2020-11-07 NOTE — Telephone Encounter (Signed)
Called and spoke to pt about his urology appt on 8/19.  Pt seen and will need and additional procedure to remove a growth that has grown due to surgery he had in 2014.  He does not have a date yet but will let us know when.  Prefers to wait on exercise until the foley can be removed due to irritation with movement. [name], BSN Cardiac and Training and development officer

## 2020-11-09 ENCOUNTER — Other Ambulatory Visit: Payer: Self-pay | Admitting: Physician Assistant

## 2020-11-10 ENCOUNTER — Other Ambulatory Visit: Payer: Self-pay | Admitting: Urology

## 2020-11-10 ENCOUNTER — Telehealth: Payer: Self-pay | Admitting: Cardiovascular Disease

## 2020-11-10 NOTE — Telephone Encounter (Signed)
   [facility] Pre-operative Risk Assessment    Request for surgical clearance:  What type of surgery is being performed? TURP   When is this surgery scheduled? TBD before Sept 19   What type of clearance is required (medical clearance vs. Pharmacy clearance to hold med vs. Both)? both  Are there any medications that need to be held prior to surgery and how long? aspirin EC 81 MG tablet 5 days before and clopidogrel (PLAVIX) 75 MG tablet 1 week   Practice name and name of physician performing surgery? Dr. Festus Aloe   What is your office phone number [phone]    7.   What is your office fax number [phone]  8.   Anesthesia type (None, local, MAC, general) ? General    Tony Shaffer 11/10/2020, 11:04 AM  _________________________________________________________________   (provider comments below)

## 2020-11-10 NOTE — Telephone Encounter (Signed)
   Dr. Johnsie Cancel-  Can you please weigh in on this situation? The patient was recently admitted for chest pain, found to have significant CAD requiring CABG x4 08/26/20. During his hospital course he was having issues with urinary retention and was discharged with Foley in place. He has since had another voiding trail and ultimately failed. He is now scheduled for a TURP and urology is asking to hold ASA x5 days and Plavix x 7 days for the procedure. CABG with LIMA to LAD, SVG to Diag and OM, SVG to PDA  Please send you antiplatelet recommendations to the pre-op pool   Thank you Sharee Pimple

## 2020-11-11 NOTE — Telephone Encounter (Signed)
    Patient Name: Tony Shaffer  DOB: September 01, 1950 MRN: [id number]  Primary Cardiologist: Jenkins Rouge, MD  Chart reviewed as part of pre-operative protocol coverage. Given past medical history and time since last visit, based on ACC/AHA guidelines, DARCEY BONIFACIO would be at acceptable risk for the planned procedure without further cardiovascular testing.   The patient was recently admitted for chest pain, found to have significant CAD requiring CABG x4 08/26/20. During his hospital course he was having issues with urinary retention and was discharged with Foley in place. He has since had another voiding trail and ultimately failed. He is now scheduled for a TURP and urology is asking to hold ASA x5 days and Plavix x 7 days for the procedure. CABG with LIMA to LAD, SVG to Diag and OM, SVG to PDA. Case discussed with Dr. Johnsie Cancel who states that the patient may hold antiplatelets as above and resume once stable from a bleeding standpoint once ok with surgery team given that he is greater than 2 months out from CABG.   The patient was advised that if he develops new symptoms prior to surgery to contact our office to arrange for a follow-up visit, and he verbalized understanding.  I will route this recommendation to the requesting party via Epic fax function and remove from pre-op pool.  Please call with questions.  Kathyrn Drown, NP 11/11/2020, 7:35 AM

## 2020-11-21 ENCOUNTER — Other Ambulatory Visit: Payer: Self-pay | Admitting: Physician Assistant

## 2020-11-23 ENCOUNTER — Encounter (HOSPITAL_BASED_OUTPATIENT_CLINIC_OR_DEPARTMENT_OTHER): Payer: Self-pay | Admitting: Urology

## 2020-11-23 NOTE — Progress Notes (Signed)
Spoke w/ via phone for pre-op interview--- pt Lab needs dos----  [employer] results------ current ekg in epic/ chart COVID test -----patient states asymptomatic no test needed Arrive at ------- 0700 on 11-25-2020 NPO after MN NO Solid Food.  Clear liquids from MN until--- 0600 Med rec completed Medications to take morning of surgery -----metoprolol, zyrtec, crestor, proscar Diabetic medication ----- n/a Patient instructed no nail polish to be worn day of surgery Patient instructed to bring photo id and insurance card day of surgery Patient aware to have Driver (ride ) / caregiver   for 24 hours after surgery ---brother, marvin mcqueen Patient Special Instructions ----- n/a Pre-Op special Istructions ----- pt has telephone cardiac clearance by Kathyrn Drown NP on 11-11-2020, in epic/ chart Patient verbalized understanding of instructions that were given at this phone interview. Patient denies shortness of breath, chest pain, fever, cough at this phone interview.    Anesthesia Review: HTN;  08-24-2020 NSTEMI, CAD s/p CABG x4 08-26-2020;  pt denies cardiac s&s, no peripheral swelling, and occasional sob.  PCP:  Dustin Folks NP Cardiologist : Dr Johnsie Cancel (lov 09-16-2020 epic) Chest x-ray : 09-22-2020 epic EKG : 09-16-2020 epic Echo : 08-25-2020/ TEE 08-26-2020 Stress test: no Cardiac Cath :  08-24-2020 epic Activity level: occ sob with long distance and stairs Sleep Study/ CPAP : no Blood Thinner/ Instructions /Last Dose: Plavix ASA / Instructions/ Last Dose :  AS '81mg'$ ;   Per pt was given instructions by cardiology to stop asa 5 days prior and plavix 7 days prior, last asa dose 11-20-2020 and plavix last dose 11-18-2020

## 2020-11-25 ENCOUNTER — Encounter (HOSPITAL_BASED_OUTPATIENT_CLINIC_OR_DEPARTMENT_OTHER): Payer: Self-pay | Admitting: Urology

## 2020-11-25 ENCOUNTER — Ambulatory Visit (HOSPITAL_BASED_OUTPATIENT_CLINIC_OR_DEPARTMENT_OTHER): Payer: Medicare HMO | Admitting: Certified Registered Nurse Anesthetist

## 2020-11-25 ENCOUNTER — Ambulatory Visit (HOSPITAL_BASED_OUTPATIENT_CLINIC_OR_DEPARTMENT_OTHER)
Admission: RE | Admit: 2020-11-25 | Discharge: 2020-11-25 | Disposition: A | Discharge status: Home / Self Care | Payer: Medicare HMO | Attending: Urology | Admitting: Urology

## 2020-11-25 ENCOUNTER — Other Ambulatory Visit: Payer: Self-pay

## 2020-11-25 ENCOUNTER — Encounter (HOSPITAL_BASED_OUTPATIENT_CLINIC_OR_DEPARTMENT_OTHER)
Admission: RE | Disposition: A | Discharge status: Home / Self Care | Payer: Self-pay | Source: Home / Self Care | Attending: Urology

## 2020-11-25 DIAGNOSIS — R3914 Feeling of incomplete bladder emptying: Secondary | ICD-10-CM

## 2020-11-25 DIAGNOSIS — Z951 Presence of aortocoronary bypass graft: Secondary | ICD-10-CM | POA: Diagnosis not present

## 2020-11-25 DIAGNOSIS — N401 Enlarged prostate with lower urinary tract symptoms: Secondary | ICD-10-CM | POA: Insufficient documentation

## 2020-11-25 DIAGNOSIS — R338 Other retention of urine: Secondary | ICD-10-CM | POA: Diagnosis not present

## 2020-11-25 HISTORY — DX: Atherosclerotic heart disease of native coronary artery without angina pectoris: I25.10

## 2020-11-25 HISTORY — PX: TRANSURETHRAL RESECTION OF PROSTATE: SHX73

## 2020-11-25 HISTORY — DX: Presence of other specified devices: Z97.8

## 2020-11-25 HISTORY — DX: Benign prostatic hyperplasia with lower urinary tract symptoms: N40.1

## 2020-11-25 HISTORY — DX: Long term (current) use of anticoagulants: Z79.01

## 2020-11-25 HISTORY — DX: Bifascicular block: I45.2

## 2020-11-25 LAB — POCT I-STAT, CHEM 8
BUN: 13 mg/dL (ref 8–23)
Calcium, Ion: 1.11 mmol/L — ABNORMAL LOW (ref 1.15–1.40)
Chloride: 107 mmol/L (ref 98–111)
Creatinine, Ser: 0.9 mg/dL (ref 0.61–1.24)
Glucose, Bld: 98 mg/dL (ref 70–99)
HCT: 41 % (ref 39.0–52.0)
Hemoglobin: 13.9 g/dL (ref 13.0–17.0)
Potassium: 4.3 mmol/L (ref 3.5–5.1)
Sodium: 138 mmol/L (ref 135–145)
TCO2: 20 mmol/L — ABNORMAL LOW (ref 22–32)

## 2020-11-25 SURGERY — TURP (TRANSURETHRAL RESECTION OF PROSTATE)
Anesthesia: General | Site: Prostate

## 2020-11-25 MED ORDER — LIDOCAINE HCL (CARDIAC) PF 100 MG/5ML IV SOSY
PREFILLED_SYRINGE | INTRAVENOUS | Status: DC | PRN
  Administered 2020-11-25: 80 mg via INTRAVENOUS

## 2020-11-25 MED ORDER — MIDAZOLAM HCL 5 MG/5ML IJ SOLN
INTRAMUSCULAR | Status: DC | PRN
  Administered 2020-11-25: 1 mg via INTRAVENOUS

## 2020-11-25 MED ORDER — AMISULPRIDE (ANTIEMETIC) 5 MG/2ML IV SOLN: 10.0000 mg | Freq: Once | INTRAVENOUS | Status: DC | PRN

## 2020-11-25 MED ORDER — OXYCODONE HCL 5 MG/5ML PO SOLN
5.0000 mg | Freq: Once | ORAL | Status: DC | PRN
Start: 2020-11-25 — End: 2020-11-25

## 2020-11-25 MED ORDER — NITROFURANTOIN MACROCRYSTAL 100 MG PO CAPS: 100.0000 mg | ORAL_CAPSULE | Freq: Every day | ORAL | 0 refills | Status: DC

## 2020-11-25 MED ORDER — ACETAMINOPHEN 500 MG PO TABS
ORAL_TABLET | ORAL | Status: AC
  Filled 2020-11-25: qty 2

## 2020-11-25 MED ORDER — LACTATED RINGERS IV SOLN: INTRAVENOUS | Status: DC

## 2020-11-25 MED ORDER — ONDANSETRON HCL 4 MG/2ML IJ SOLN
INTRAMUSCULAR | Status: DC | PRN
  Administered 2020-11-25: 4 mg via INTRAVENOUS

## 2020-11-25 MED ORDER — BELLADONNA ALKALOIDS-OPIUM 16.2-60 MG RE SUPP
RECTAL | Status: DC | PRN
  Administered 2020-11-25: 1 via RECTAL

## 2020-11-25 MED ORDER — CEFAZOLIN SODIUM-DEXTROSE 2-4 GM/100ML-% IV SOLN
INTRAVENOUS | Status: AC
  Filled 2020-11-25: qty 100

## 2020-11-25 MED ORDER — MIDAZOLAM HCL 2 MG/2ML IJ SOLN
INTRAMUSCULAR | Status: AC
  Filled 2020-11-25: qty 2

## 2020-11-25 MED ORDER — FENTANYL CITRATE (PF) 100 MCG/2ML IJ SOLN: 25.0000 ug | INTRAMUSCULAR | Status: DC | PRN

## 2020-11-25 MED ORDER — EPHEDRINE 5 MG/ML INJ
INTRAVENOUS | Status: AC
  Filled 2020-11-25: qty 5

## 2020-11-25 MED ORDER — FENTANYL CITRATE (PF) 100 MCG/2ML IJ SOLN
INTRAMUSCULAR | Status: AC
  Filled 2020-11-25: qty 2

## 2020-11-25 MED ORDER — PROPOFOL 10 MG/ML IV BOLUS
INTRAVENOUS | Status: AC
  Filled 2020-11-25: qty 20

## 2020-11-25 MED ORDER — CEFAZOLIN SODIUM-DEXTROSE 2-4 GM/100ML-% IV SOLN
2.0000 g | INTRAVENOUS | Status: AC
  Administered 2020-11-25: 2 g via INTRAVENOUS

## 2020-11-25 MED ORDER — DEXAMETHASONE SODIUM PHOSPHATE 10 MG/ML IJ SOLN
INTRAMUSCULAR | Status: AC
  Filled 2020-11-25: qty 1

## 2020-11-25 MED ORDER — EPHEDRINE SULFATE 50 MG/ML IJ SOLN
INTRAMUSCULAR | Status: DC | PRN
  Administered 2020-11-25: 25 mg via INTRAVENOUS

## 2020-11-25 MED ORDER — PROPOFOL 10 MG/ML IV BOLUS
INTRAVENOUS | Status: DC | PRN
  Administered 2020-11-25: 20 mg via INTRAVENOUS
  Administered 2020-11-25: 150 mg via INTRAVENOUS

## 2020-11-25 MED ORDER — PHENYLEPHRINE HCL (PRESSORS) 10 MG/ML IV SOLN
INTRAVENOUS | Status: DC | PRN
  Administered 2020-11-25: 80 ug via INTRAVENOUS
  Administered 2020-11-25: 120 ug via INTRAVENOUS
  Administered 2020-11-25 (×2): 80 ug via INTRAVENOUS
  Administered 2020-11-25: 120 ug via INTRAVENOUS
  Administered 2020-11-25: 80 ug via INTRAVENOUS
  Administered 2020-11-25: 120 ug via INTRAVENOUS

## 2020-11-25 MED ORDER — DEXAMETHASONE SODIUM PHOSPHATE 4 MG/ML IJ SOLN
INTRAMUSCULAR | Status: DC | PRN
  Administered 2020-11-25: 5 mg via INTRAVENOUS

## 2020-11-25 MED ORDER — ACETAMINOPHEN 500 MG PO TABS
1000.0000 mg | ORAL_TABLET | Freq: Once | ORAL | Status: AC
  Administered 2020-11-25: 1000 mg via ORAL

## 2020-11-25 MED ORDER — PHENYLEPHRINE 40 MCG/ML (10ML) SYRINGE FOR IV PUSH (FOR BLOOD PRESSURE SUPPORT)
PREFILLED_SYRINGE | INTRAVENOUS | Status: AC
  Filled 2020-11-25: qty 10

## 2020-11-25 MED ORDER — OXYCODONE HCL 5 MG PO TABS: 5.0000 mg | ORAL_TABLET | Freq: Once | ORAL | Status: DC | PRN

## 2020-11-25 MED ORDER — ONDANSETRON HCL 4 MG/2ML IJ SOLN
INTRAMUSCULAR | Status: AC
  Filled 2020-11-25: qty 2

## 2020-11-25 MED ORDER — CLOPIDOGREL BISULFATE 75 MG PO TABS: 75.0000 mg | ORAL_TABLET | Freq: Every day | ORAL | 1 refills | Status: DC

## 2020-11-25 MED ORDER — BELLADONNA ALKALOIDS-OPIUM 16.2-60 MG RE SUPP
RECTAL | Status: AC
  Filled 2020-11-25: qty 1

## 2020-11-25 MED ORDER — ONDANSETRON HCL 4 MG/2ML IJ SOLN
4.0000 mg | Freq: Once | INTRAMUSCULAR | Status: DC | PRN
Start: 2020-11-25 — End: 2020-11-25

## 2020-11-25 MED ORDER — FENTANYL CITRATE (PF) 100 MCG/2ML IJ SOLN
INTRAMUSCULAR | Status: DC | PRN
  Administered 2020-11-25 (×2): 50 ug via INTRAVENOUS

## 2020-11-25 MED ORDER — SODIUM CHLORIDE 0.9 % IR SOLN
Status: DC | PRN
  Administered 2020-11-25 (×2): 3000 mL via INTRAVESICAL
  Administered 2020-11-25: 6000 mL via INTRAVESICAL
  Administered 2020-11-25 (×2): 3000 mL via INTRAVESICAL

## 2020-11-25 MED ORDER — LIDOCAINE 2% (20 MG/ML) 5 ML SYRINGE
INTRAMUSCULAR | Status: AC
  Filled 2020-11-25: qty 5

## 2020-11-25 SURGICAL SUPPLY — 28 items
BAG DRAIN URO-CYSTO SKYTR STRL (DRAIN) ×2 IMPLANT
BAG DRN RND TRDRP ANRFLXCHMBR (UROLOGICAL SUPPLIES) ×1
BAG DRN UROCATH (DRAIN) ×1
BAG URINE DRAIN 2000ML AR STRL (UROLOGICAL SUPPLIES) ×2 IMPLANT
BAG URINE LEG 500ML (DRAIN) IMPLANT
CATH FOLEY 2WAY SLVR 30CC 20FR (CATHETERS) ×1 IMPLANT
CATH FOLEY 3WAY 30CC 22FR (CATHETERS) ×2 IMPLANT
CATH HEMA 3WAY 30CC 24FR COUDE (CATHETERS) IMPLANT
CATH HEMA 3WAY 30CC 24FR RND (CATHETERS) ×1 IMPLANT
CLOTH BEACON ORANGE TIMEOUT ST (SAFETY) ×2 IMPLANT
ELECT REM PT RETURN 9FT ADLT (ELECTROSURGICAL)
ELECTRODE REM PT RTRN 9FT ADLT (ELECTROSURGICAL) ×1 IMPLANT
EVACUATOR MICROVAS BLADDER (UROLOGICAL SUPPLIES) IMPLANT
GLOVE SURG ENC MOIS LTX SZ7.5 (GLOVE) ×2 IMPLANT
GLOVE SURG ENC MOIS LTX SZ8 (GLOVE) IMPLANT
GOWN STRL REUS W/TWL LRG LVL3 (GOWN DISPOSABLE) ×2 IMPLANT
HOLDER FOLEY CATH W/STRAP (MISCELLANEOUS) IMPLANT
IV NS IRRIG 3000ML ARTHROMATIC (IV SOLUTION) ×6 IMPLANT
KIT TURNOVER CYSTO (KITS) ×2 IMPLANT
LOOP CUT BIPOLAR 24F LRG (ELECTROSURGICAL) ×2 IMPLANT
MANIFOLD NEPTUNE II (INSTRUMENTS) ×2 IMPLANT
PACK CYSTO (CUSTOM PROCEDURE TRAY) ×2 IMPLANT
PLUG CATH AND CAP STER (CATHETERS) IMPLANT
SYR 30ML LL (SYRINGE) ×2 IMPLANT
SYR TOOMEY IRRIG 70ML (MISCELLANEOUS) ×2
SYRINGE TOOMEY IRRIG 70ML (MISCELLANEOUS) ×1 IMPLANT
TUBE CONNECTING 12X1/4 (SUCTIONS) ×2 IMPLANT
TUBING UROLOGY SET (TUBING) ×2 IMPLANT

## 2020-11-25 NOTE — Progress Notes (Signed)
61 Pt called nurse into room as he was getting dressed to go home. Moderate amount of bloody drainage on floor at patient's feet and pt c/o leaking around meatus.  Pt denies pain, but does c/o urgency to void.  Minimal clear yellow urine in foley bag.  Palpation of bladder reveals firm bladder.  Bladder scan X 3 reveals greater than 572m of urine in bladder.    1218 Assisted by CKnox Saliva RN 180 mL of sterile NS flushed through catheter.  Small pea sized piece of tissue and lots of flecks of black sediment produced from bladder along with approx 520 mL of clear yellow urine.  Post-flush bladder scan reveals 25 mL residual.  Pt states he feels much better.  1220  Dr EJunious Silkcalled and notified of above events.  OK to proceed with d/c to home as planned.  Pt advised to monitor urine output through foley tubing and contact Dr ELyndal Rainbowoffice with any further issues.  Phone number to office written on pt's d/c instructions.

## 2020-11-25 NOTE — Transfer of Care (Signed)
Immediate Anesthesia Transfer of Care Note  Patient: Tony Shaffer  Procedure(s) Performed: TRANSURETHRAL RESECTION OF THE PROSTATE (TURP) (Prostate)  Patient Location: PACU  Anesthesia Type:General  Level of Consciousness: awake, alert  and oriented  Airway & Oxygen Therapy: Patient Spontanous Breathing and Patient connected to nasal cannula oxygen  Post-op Assessment: Report given to RN and Post -op Vital signs reviewed and stable  Post vital signs: Reviewed and stable  Last Vitals:  Vitals Value Taken Time  BP 130/72 11/25/20 1000  Temp    Pulse 82 11/25/20 1000  Resp 14 11/25/20 1000  SpO2 100 % 11/25/20 1000  Vitals shown include unvalidated device data.  Last Pain:  Vitals:   11/23/20 1250  TempSrc: Oral  PainSc: 0-No pain      Patients Stated Pain Goal: 3 ([id number])  Complications: No notable events documented.

## 2020-11-25 NOTE — H&P (Signed)
Office Visit Report     11/04/2020   --------------------------------------------------------------------------------   Tony Shaffer. [place]  [id number]  DOB: Mar 01, 1951, 70 year old Male  SSN: [id number]   PRIMARY CARE:  Viviann Spare., NP  REFERRING:  Daine Gravel, NP  PROVIDER:  Festus Aloe, M.D.  LOCATION:  [facility] [phone]     --------------------------------------------------------------------------------   CC/HPI: F/u -   1) BPH-history of BPH, 250 g prostate. Status post open simple prostatectomy 10/17/2012 for urinary retention. Required CIC and bethanechol postop. CT 2014 postop with some residual median lobe but no bladder mass. Currently on no prostate meds. Recently, prior to CABG, he has not done CIC. He had an adequate flow. Felt empty.   He underwent CABG 08/26/2020 and Foley catheter could not be placed. Dr. Alyson Ingles scope and a Foley and noted a false passage at the bladder neck. Foley has been draining well. Not sure if he's on tamsulosin.   He failed a voiding trial in June and two in July 2022. Here with foley and for cysto/void trial. Catheter passed easily. Cysto today, 08/22, with a knuckle of intravesical prostate completely obstructing his BN which otherwise looks patent (residual median lobe seen on post-op CT).   Today, seen for the above.   Dr. Orvan Seen - CT surg  Dr. Audie Box - cards      ALLERGIES: None   MEDICATIONS: Finasteride 5 mg tablet 1 tablet PO Daily  Metoprolol Tartrate 37.5 mg tablet  Tamsulosin Hcl 0.4 mg capsule 1 capsule PO Q HS  Aspirin Ec 81 mg tablet, delayed release  Atorvastatin Calcium 40 mg tablet  Calcium + D  Clopidogrel 75 mg tablet  Colchicine 0.6 mg tablet  Furosemide 40 mg tablet  Losartan Potassium 25 mg tablet  Pantoprazole Sodium 40 mg tablet, delayed release  Potassium Chloride 20 meq tablet, extended release  Tizanidine Hcl 4 mg tablet  Tramadol Hcl 50 mg tablet     GU PSH: Remove  Prostate. - 2014       PSH Notes: Prostatectomy, Retropubic, No Surgical Problems   NON-GU PSH: CABG (coronary artery bypass grafting)     GU PMH: Urinary Retention - 10/07/2020, - 09/23/2020 BPH w/LUTS, Void trial in about 10 days one morning. I discussed the nature r/b/a to alpha blockers and 5aris (including FDA warnings) and he will start both. Pt can learn CIC if he fails. See me back for cysto. - 09/02/2020, Benign localized hyperplasia of prostate with urinary obstruction, - 2015 Post-void dribbling - 09/02/2020 Bladder Diverticulum, Bladder diverticulum - 2015 Elevated PSA, Elevated prostate specific antigen (PSA) - 2015 Urinary Tract Inf, Unspec site, Urinary tract infection - 2015 Stress Incontinence, Male stress incontinence - 2014 Acute prostatitis, Prostatitis, acute - 2014 Dysuria, Dysuria - 2014 Urinary Retention, Unspec, Urinary retention - 2014    NON-GU PMH: Contusion of urethra, subsequent encounter, Covered with a cephalexin today . Coude went in easily. - 09/02/2020 Encounter for general adult medical examination without abnormal findings, Encounter for preventive health examination - 2015 Muscle weakness (generalized), Muscle weakness - 2014 Anxiety, Anxiety (Symptom) - 2014 Personal history of other mental and behavioral disorders, History of depression - 2014 Personal history of other specified conditions, History of vertigo - 2014 GERD Hypertension Myocardial Infarction    FAMILY HISTORY: Breast Lump - Mother, Sister, Sister Heart Disease - Mother Laryngeal Cancer - Runs In Family   SOCIAL HISTORY: Marital Status: Single Preferred Language: English; Ethnicity: Not Hispanic Or Latino; Race: Black  or African American Current Smoking Status: Patient has never smoked.   Tobacco Use Assessment Completed: Used Tobacco in last 30 days? Does not use smokeless tobacco. Has never drank.  Does not drink caffeine. Patient's occupation is/was retired.     Notes:  Activities Of Daily Living, Self-reliant In Usual Daily Activities, Living Independently With Spouse, Exercise Habits, Never A Smoker, Marital History - Single, Alcohol Use, Tobacco Use, Caffeine Use, Occupation:   REVIEW OF SYSTEMS:    GU Review Male:   Patient denies frequent urination, hard to postpone urination, burning/ pain with urination, get up at night to urinate, leakage of urine, stream starts and stops, trouble starting your stream, have to strain to urinate , erection problems, and penile pain.  Gastrointestinal (Upper):   Patient denies nausea, vomiting, and indigestion/ heartburn.  Gastrointestinal (Lower):   Patient denies diarrhea and constipation.  Constitutional:   Patient denies fever, night sweats, weight loss, and fatigue.  Skin:   Patient denies skin rash/ lesion and itching.  Eyes:   Patient denies blurred vision and double vision.  Ears/ Nose/ Throat:   Patient denies sore throat and sinus problems.  Hematologic/Lymphatic:   Patient denies swollen glands and easy bruising.  Cardiovascular:   Patient denies leg swelling and chest pains.  Respiratory:   Patient denies cough and shortness of breath.  Endocrine:   Patient denies excessive thirst.  Musculoskeletal:   Patient denies back pain and joint pain.  Neurological:   Patient denies headaches and dizziness.  Psychologic:   Patient denies depression and anxiety.   VITAL SIGNS: None   GU PHYSICAL EXAMINATION:    Scrotum: No lesions. No edema. No cysts. No warts.  Urethral Meatus: Normal size. No lesion, no wart, no discharge, no polyp. Normal location.  Penis: Circumcised, no warts, no cracks. No dorsal Peyronie's plaques, no left corporal Peyronie's plaques, no right corporal Peyronie's plaques, no scarring, no warts. No balanitis, no meatal stenosis.   MULTI-SYSTEM PHYSICAL EXAMINATION:    Constitutional: Well-nourished. No physical deformities. Normally developed. Good grooming.  Neck: Neck symmetrical, not  swollen. Normal tracheal position.  Respiratory: No labored breathing, no use of accessory muscles.   Cardiovascular: Normal temperature, normal extremity pulses, no swelling, no varicosities.  Skin: No paleness, no jaundice, no cyanosis. No lesion, no ulcer, no rash.  Neurologic / Psychiatric: Oriented to time, oriented to place, oriented to person. No depression, no anxiety, no agitation.  Gastrointestinal: No mass, no tenderness, no rigidity, non obese abdomen.     Complexity of Data:   07/28/12 09/28/11 07/19/10 01/12/10  PSA  Total PSA 8.63  13.16  14.70  12.06   Free PSA 2.14      % Free PSA 25        PROCEDURES:         Flexible Cystoscopy - 52000  Risks, benefits, and some of the potential complications of the procedure were discussed with the patient. All questions were answered. Informed consent was obtained. Antibiotic prophylaxis was given -- Cephalexin. Sterile technique and intraurethral analgesia were used.  Meatus:  Normal size. Normal location. Normal condition.  Urethra:  No strictures.  External Sphincter:  Normal.  Verumontanum:  Normal.  Prostate:  Obstructing. Moderate hyperplasia. Well resected prostate fossa, but a knuckle of intravesical prostate completely obstructing his BN.  Bladder Neck:  Non-obstructing.  Ureteral Orifices:  Normal location. Normal size. Normal shape. Effluxed clear urine.  Bladder:  Mild trabeculation. No tumors. Normal mucosa. No stones.  The lower urinary tract was carefully examined. The procedure was well-tolerated and without complications. Antibiotic instructions were given. Instructions were given to call the office immediately for bloody urine, difficulty urinating, painful urination, fever, chills, nausea, vomiting or other illness. The patient stated that he understood these instructions and would comply with them.        Simple Foley Catheterization - [id number]  A 16 French COUDE' Foley catheter was inserted into the bladder  using sterile technique. The patient was taught routine catheter care. A bedside bag was connected.   ASSESSMENT:      ICD-10 Details  1 GU:   BPH w/LUTS - N40.1 Chronic, Stable - Disc the nature r/b/a to TURP as it would be straightforward to resect the obstructing tissue. Disc risk of continue retention (if bladder failure), stricture and worsening incontinence among the others. All questions answered.   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 Tamala Julian     * Signed by Festus Aloe, M.D. on 11/04/20 at 4:03 PM (EDT)*      The information contained in this medical record document is considered private and confidential patient information. This information can only be used for the medical diagnosis and/or medical services that are being provided by the patient's selected caregivers. This information can only be distributed outside of the patient's care if the patient agrees and signs waivers of authorization for this information to be sent to an outside source or route.

## 2020-11-25 NOTE — Op Note (Signed)
Preoperative diagnosis: BPH with urinary retention  Postoperative diagnosis: Same   Procedure: Transurethral resection of prostate   Surgeon: Junious Silk  Anesthesia: General  Indication for procedure: Mr. Capstick is a 70 year old male who underwent open simple prostatectomy in 2014.  Residual median lobe was noted on postop imaging.  He has done well until recently when he had a CABG and has been in retention since then.  He is failed a few voiding trials and on office cystoscopy the median lobe is completely obstructing his bladder neck.  Findings: On exam under anesthesia the penis was circumcised without mass or lesion.  Testicles descended bilaterally.  On DRE the prostate was about 30 g and smooth without hard area or nodule.  On cystoscopy the prostatic fossa was patent but the bladder neck completely obstructed by large ball valving median lobe.  Bladder was moderately trabeculated with no mucosal lesion apart from some posterior Foley irritation.  No stone in the bladder.  Ureteral orifice ease were identified pre and post resection and were actually well away from the median lobe.  Description of procedure: After consent was obtained patient brought to the operating room.  After adequate anesthesia he was placed lithotomy position and prepped and draped in the usual sterile fashion.  A timeout was performed to confirm the patient and procedure.  The cystoscope was passed per urethra and the bladder inspected.  I then swapped out out for the continuous-flow sheath passed with the visual obturator.  The loop and handle were then passed.  I identified and marked both ureteral orifice ease.  I then started on the patient's left side and began resection at the groove between the median lobe and the remainder of the prostate to redefine the bladder neck.  This resection was very shallow again just to define the edge of the median lobe.  This was brought to about the mid prostatic urethra and no  further.  Similarly on the patient's right side bladder neck was redefined with a shallow incision.  The median lobe was then resected.  This created a wide open channel for voiding and hemostasis was excellent at low pressure.  All the chips were evacuated.  Again inspection noted the ureteral orifices to be well away from the resection, excellent hemostasis and all chips evacuated.  The scope was backed out and a 20 [place] two-way catheter passed in left to gravity drainage with clear irrigation.  I did an exam under anesthesia and placed a BNO suppository.  He was awakened taken the cover room in stable condition.  Complications: None  EBL: 50 ml   Specimens to pathology: TURP chips  Drains: 20 French Foley  Disposition: Patient stable to PACU

## 2020-11-25 NOTE — Interval H&P Note (Signed)
History and Physical Interval Note:  11/25/2020 8:37 AM  Tony Shaffer  has presented today for surgery, with the diagnosis of BENIGN PROSTATIC HYPERPLASIA, URINARY RETENTION.  The various methods of treatment have been discussed with the patient and family. After consideration of risks, benefits and other options for treatment, the patient has consented to  Procedure(s) with comments: [place] (TURP) (N/A) - 90 MINS as a surgical intervention.  The patient's history has been reviewed, patient examined, no change in status, stable for surgery.  I have reviewed the patient's chart and labs.  He continues to require the Foley catheter.  We discussed the cystoscopic findings again and visual obstruction.  We also discussed obtaining urodynamics to be more certain of his bladder function and confirm obstruction.  We also discussed he may continue to need CIC.  We discussed other options again such as alpha blockers and 5 alpha reductase inhibitors.  He is given a lot of thought and had good questions. Questions were answered to the patient's satisfaction.  He elects to proceed.   Festus Aloe

## 2020-11-25 NOTE — Discharge Instructions (Addendum)
Transurethral Procedure  Medications: Resume all your other meds from home  Activity: 1. No heavy lifting > 10 pounds for 2 weeks. 2. No sexual activity for 2 weeks. 3. No strenuous activity for 2 weeks. 4. No driving while on narcotic pain medications. 5. Drink plenty of water. 6. Continue to walk at home - you can still get blood clots when you are at home so keep active but don't over do it. 7. Your urine may have some blood in it - make sure you drink plenty of water. Call or come to the ER immediately if you catheter stops draining or you are unable to urinate.  Bathing: You can shower. You can take a bath unless you have a foley catheter in place.  Signs / Symptoms to call: 1. Call if you have a fever greater than 101.5  2. Uncontrolled nausea / vomiting, uncontrolled pain / dizziness, unable to urinate, leg swelling / leg pain, or any other concerns.  **No acetaminophen or Tylenol until after 1:30pm today if needed.  Activity: Get plenty of rest for the remainder of the day. A responsible individual must stay with you for 24 hours following the procedure.  For the next 24 hours, DO NOT: -Drive a car -Paediatric nurse -Drink alcoholic beverages -Take any medication unless instructed by your physician -Make any legal decisions or sign important papers.  Meals: Start with liquid foods such as gelatin or soup. Progress to regular foods as tolerated. Avoid greasy, spicy, heavy foods. If nausea and/or vomiting occur, drink only clear liquids until the nausea and/or vomiting subsides. Call your physician if vomiting continues.  Special Instructions/Symptoms: Your throat may feel dry or sore from the anesthesia or the breathing tube placed in your throat during surgery. If this causes discomfort, gargle with warm salt water. The discomfort should disappear within 24 hours.   Post Anesthesia Home Care Instructions  Activity: Get  plenty of rest for the remainder of the day. A responsible individual must stay with you for 24 hours following the procedure.  For the next 24 hours, DO NOT: -Drive a car -Paediatric nurse -Drink alcoholic beverages -Take any medication unless instructed by your physician -Make any legal decisions or sign important papers.  Meals: Start with liquid foods such as gelatin or soup. Progress to regular foods as tolerated. Avoid greasy, spicy, heavy foods. If nausea and/or vomiting occur, drink only clear liquids until the nausea and/or vomiting subsides. Call your physician if vomiting continues.  Special Instructions/Symptoms: Your throat may feel dry or sore from the anesthesia or the breathing tube placed in your throat during surgery. If this causes discomfort, gargle with warm salt water. The discomfort should disappear within 24 hours.       Indwelling Urinary Catheter Care, Adult An indwelling urinary catheter is a thin tube that is put into your bladder. The tube helps to drain pee (urine) out of your body. The tube goes in through your urethra. Your urethra is where pee comes out of your body. Your pee will come out through the catheter, then it will go into a bag (drainage bag). Take good care of your catheter so it will work well. How to wear your catheter and bag Supplies needed Sticky tape (adhesive tape) or a leg strap. Alcohol wipe or soap and water (if you use tape). A clean towel (if you use tape). Large overnight bag. Smaller bag (leg bag). Wearing your catheter Attach your catheter to your leg with tape or a leg  strap. Make sure the catheter is not pulled tight. If a leg strap gets wet, take it off and put on a dry strap. If you use tape to hold the bag on your leg: Use an alcohol wipe or soap and water to wash your skin where the tape made it sticky before. Use a clean towel to pat-dry that skin. Use new tape to make the bag stay on your leg. Wearing your bags You  should have been given a large overnight bag. You may wear the overnight bag in the day or night. Always have the overnight bag lower than your bladder.  Do not let the bag touch the floor. Before you go to sleep, put a clean plastic bag in a wastebasket. Then hang the overnight bag inside the wastebasket. You should also have a smaller leg bag that fits under your clothes. Always wear the leg bag below your knee. Do not wear your leg bag at night. How to care for your skin and catheter Supplies needed A clean washcloth. Water and mild soap. A clean towel. Caring for your skin and catheter    Clean the skin around your catheter every day: Wash your hands with soap and water. Wet a clean washcloth in warm water and mild soap. Clean the skin around your urethra. If you are male: Gently spread the folds of skin around your vagina (labia). With the washcloth in your other hand, wipe the inner side of your labia on each side. Wipe from front to back. If you are male: Pull back any skin that covers the end of your penis (foreskin). With the washcloth in your other hand, wipe your penis in small circles. Start wiping at the tip of your penis, then move away from the catheter. Move the foreskin back in place, if needed. With your free hand, hold the catheter close to where it goes into your body. Keep holding the catheter during cleaning so it does not get pulled out. With the washcloth in your other hand, clean the catheter. Only wipe downward on the catheter, toward the drainage bag. Do not wipe upward toward your body. Doing this may push germs into your urethra and cause infection. Use a clean towel to pat-dry the catheter and the skin around it. Make sure to wipe off all soap. Wash your hands with soap and water. Shower every day. Do not take baths. Do not use cream, ointment, or lotion on the area where the catheter goes into your body, unless your doctor tells you to. Do not use  powders, sprays, or lotions on your genital area. Check your skin around the catheter every day for signs of infection. Check for: Redness, swelling, or pain. Fluid or blood. Warmth. Pus or a bad smell. How to empty the bag Supplies needed Rubbing alcohol. Gauze pad or cotton ball. Tape or a leg strap. Emptying the bag Pour the pee out of your bag when it is ?- full, or at least 2-3 times a day. Do this for your overnight bag and your leg bag. Wash your hands with soap and water. Separate (detach) the bag from your leg. Hold the bag over the toilet or a clean pail. Keep the bag lower than your hips and bladder. This is so the pee (urine) does not go back into the tube. Open the pour spout. It is at the bottom of the bag. Empty the pee into the toilet or pail. Do not let the pour spout touch any surface.  Put rubbing alcohol on a gauze pad or cotton ball. Use the gauze pad or cotton ball to clean the pour spout. Close the pour spout. Attach the bag to your leg with tape or a leg strap. Wash your hands with soap and water. Follow instructions for cleaning the drainage bag: From the product maker. As told by your doctor. How to change the bag Changing the bag Replace your bag when it starts to leak, smell bad, or look dirty. Wash your hands with soap and water. Separate the dirty bag from your leg. Pinch the catheter with your fingers so that pee does not spill out. Separate the catheter tube from the bag tube where these tubes connect (at the connection valve). Do not let the tubes touch any surface. Clean the end of the catheter tube with an alcohol wipe. Use a different alcohol wipe to clean the end of the bag tube. Connect the catheter tube to the tube of the clean bag. Attach the clean bag to your leg with tape or a leg strap. Do not make the bag tight on your leg. Wash your hands with soap and water. General rules Never pull on your catheter. Never try to take it out. Doing  that can hurt you. Always wash your hands before and after you touch your catheter or bag. Use a mild, fragrance-free soap. If you do not have soap and water, use hand sanitizer. Always make sure there are no twists or bends (kinks) in the catheter tube. Always make sure there are no leaks in the catheter or bag. Drink enough fluid to keep your pee pale yellow. Do not take baths, swim, or use a hot tub. If you are male, wipe from front to back after you poop (have a bowel movement). Contact a doctor if: Your pee is cloudy. Your pee smells worse than usual. Your catheter gets clogged. Your catheter leaks. Your bladder feels full. Get help right away if: You have redness, swelling, or pain where the catheter goes into your body. You have fluid, blood, pus, or a bad smell coming from the area where the catheter goes into your body. Your skin feels warm where the catheter goes into your body. You have a fever. You have pain in your: Belly (abdomen). Legs. Lower back. Bladder. You see blood in the catheter. Your pee is pink or red. You feel sick to your stomach (nauseous). You throw up (vomit). You have chills. Your pee is not draining into the bag. Your catheter gets pulled out. Summary An indwelling urinary catheter is a thin tube that is placed into the bladder to help drain pee (urine) out of the body. The catheter is placed into the part of the body that drains pee from the bladder (urethra). Taking good care of your catheter will keep it working properly and help prevent problems. Always wash your hands before and after touching your catheter or bag. Never pull on your catheter or try to take it out. This information is not intended to replace advice given to you by your health care provider. Make sure you discuss any questions you have with your health care provider. Document Revised: 05/25/2020 Document Reviewed: 02/19/2020 Elsevier Patient Education  2022 [employer].        Transurethral Resection of the Prostate, Care After This sheet gives you information about how to care for yourself after your procedure. Your health care provider may also give you more specific instructions. If you have problems or questions,  contact your health care provider. What can I expect after the procedure? After the procedure, it is common to have: Mild pain in your lower abdomen. Soreness or mild discomfort in your penis from having the catheter inserted during the procedure. A feeling of urgency when you need to urinate. A small amount of blood in your urine. You may notice some small blood clots in your urine. These are normal. Follow these instructions at home: Medicines Take over-the-counter and prescription medicines only as told by your health care provider. If you were prescribed an antibiotic medicine, take it as told by your health care provider. Do not stop taking the antibiotic even if you start to feel better. Ask your health care provider if the medicine prescribed to you: Requires you to avoid driving or using heavy machinery. Can cause constipation. You may need to take actions to prevent or treat constipation, such as: Take over-the-counter or prescription medicines. Eat foods that are high in fiber, such as fresh fruits and vegetables, whole grains, and beans. Limit foods that are high in fat and processed sugars, such as fried or sweet foods. Do not drive for 24 hours if you were given a sedative during your procedure. Activity Return to your normal activities as told by your health care provider. Ask your health care provider what activities are safe for you. Do not lift anything that is heavier than 10 lb (4.5 kg), or the limit that you are told, for 3 weeks after the procedure or until your health care provider says that it is safe. Avoid intense physical activity for as long as told by your health care provider. Avoid sitting for a long time without  moving. Get up and move around one or more times every few hours. This helps to prevent blood clots. You may increase your physical activity gradually as you start to feel better. Lifestyle Do not drink alcohol for as long as told by your health care provider. This is especially important if you are taking prescription pain medicines. Do not engage in sexual activity until your health care provider says that you can do this. General instructions Do not take baths, swim, or use a hot tub until your health care provider approves. Drink enough fluid to keep your urine pale yellow. Urinate as soon as you feel the need to. Do not try to hold your urine for long periods of time. If your health care provider approves, you may take a stool softener for 2-3 weeks to prevent you from straining to have a bowel movement. Wear compression stockings as told by your health care provider. These stockings help to prevent blood clots and reduce swelling in your legs. Keep all follow-up visits as told by your health care provider. This is important. Contact a health care provider if you have: Difficulty urinating. A fever. Pain that gets worse or does not improve with medicine. Blood in your urine that does not go away after 1 week of resting and drinking more fluids. Swelling in your penis or testicles. Get help right away if: You are unable to urinate. You are having more blood clots in your urine instead of fewer. You have: Large blood clots. A lot of blood in your urine. Pain in your back or lower abdomen. Pain or swelling in your legs. Chills and you are shaking. Difficulty breathing or shortness of breath. Summary After the procedure, it is common to have a small amount of blood in your urine. Avoid heavy lifting and  intense physical activity for as long as told by your health care provider. Urinate as soon as you feel the need to. Do not try to hold your urine for long periods of time. Keep all  follow-up visits as told by your health care provider. This is important. This information is not intended to replace advice given to you by your health care provider. Make sure you discuss any questions you have with your health care provider. Document Revised: 06/15/2020 Document Reviewed: 12/04/2017 Elsevier Patient Education  2022 [employer].

## 2020-11-25 NOTE — Anesthesia Procedure Notes (Signed)
Procedure Name: LMA Insertion Date/Time: 11/25/2020 8:47 AM Performed by: Bufford Spikes, CRNA Pre-anesthesia Checklist: Patient identified, Emergency Drugs available, Suction available and Patient being monitored Patient Re-evaluated:Patient Re-evaluated prior to induction Oxygen Delivery Method: Circle system utilized Preoxygenation: Pre-oxygenation with 100% oxygen Induction Type: IV induction Ventilation: Mask ventilation without difficulty LMA: LMA inserted LMA Size: 4.0 Number of attempts: 1 Placement Confirmation: positive ETCO2 Tube secured with: Tape Dental Injury: Teeth and Oropharynx as per pre-operative assessment

## 2020-11-25 NOTE — Anesthesia Preprocedure Evaluation (Addendum)
Anesthesia Evaluation  Patient identified by MRN, date of birth, ID band Patient awake    Reviewed: Allergy & Precautions, NPO status , Patient's Chart, lab work & pertinent test results  Airway Mallampati: I  TM Distance: >3 FB Neck ROM: Full    Dental no notable dental hx.    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)  Normal cardiovascular exam+ dysrhythmias (RBBB)  Rhythm:Regular Rate:Normal     Neuro/Psych negative neurological ROS  negative psych ROS   GI/Hepatic Neg liver ROS, GERD  ,  Endo/Other  negative endocrine ROS  Renal/GU negative Renal ROS     Musculoskeletal negative musculoskeletal ROS (+)   Abdominal   Peds  Hematology negative hematology ROS (+)   Anesthesia Other Findings Enlarged prostate  Reproductive/Obstetrics negative OB ROS                            Anesthesia Physical Anesthesia Plan  ASA: 3  Anesthesia Plan: General   Post-op Pain Management:    Induction: Intravenous  PONV Risk Score and Plan: 2 and Treatment may vary due to age or medical condition, Ondansetron and Dexamethasone  Airway Management Planned: LMA  Additional Equipment: None  Intra-op Plan:   Post-operative Plan: Extubation in OR  Informed Consent: I have reviewed the patients History and Physical, chart, labs and discussed the procedure including the risks, benefits and alternatives for the proposed anesthesia with the patient or authorized representative who has indicated his/her understanding and acceptance.     Dental advisory given  Plan Discussed with: Anesthesiologist and CRNA  Anesthesia Plan Comments: (Last dose of plavix 9/2; asa 9/4)      Anesthesia Quick Evaluation

## 2020-11-25 NOTE — Anesthesia Postprocedure Evaluation (Signed)
Anesthesia Post Note  Patient: Tony Shaffer  Procedure(s) Performed: TRANSURETHRAL RESECTION OF THE PROSTATE (TURP) (Prostate)     Patient location during evaluation: PACU Anesthesia Type: General Level of consciousness: awake and alert Pain management: pain level controlled Vital Signs Assessment: post-procedure vital signs reviewed and stable Respiratory status: spontaneous breathing, nonlabored ventilation and respiratory function stable Cardiovascular status: blood pressure returned to baseline and stable Postop Assessment: no apparent nausea or vomiting Anesthetic complications: no   No notable events documented.  Last Vitals:  Vitals:   11/25/20 1030 11/25/20 1100  BP: (!) 144/76 (!) 153/75  Pulse: 83   Resp: 15 16  Temp: 36.4 C (!) 36.4 C  SpO2: 100% 96%    Last Pain:  Vitals:   11/25/20 1100  TempSrc:   PainSc: 4                  [name]

## 2020-11-28 ENCOUNTER — Encounter (HOSPITAL_BASED_OUTPATIENT_CLINIC_OR_DEPARTMENT_OTHER): Payer: Self-pay | Admitting: Urology

## 2020-11-28 LAB — SURGICAL PATHOLOGY

## 2020-12-06 ENCOUNTER — Other Ambulatory Visit: Payer: Self-pay | Admitting: Physician Assistant

## 2020-12-19 NOTE — Progress Notes (Signed)
Office Visit    Patient Name: Tony Shaffer Date of Encounter: 12/23/2020  PCP:  [facility]  Cardiologist:  Jenkins Rouge, MD  Advanced Practice Provider:  No care team member to display Electrophysiologist:  None   Chief Complaint    Tony Shaffer is a 70 y.o. male with a hx of BPH, CAD s/p CABG, RBBB, LAFB, bilateral carotid artery stenosis (08/2020 1-39%), hypertension presents today for followup after CABG.   Past Medical History    Past Medical History:  Diagnosis Date   Anticoagulated by anticoagulation treatment    plavix--- managed by cardiology   Benign prostatic hyperplasia with urinary retention    urologist-- dr Junious Silk;   s/p prostatectomy in  08/ 2014 for very enlarged prostate   Coronary artery disease    cardiologist--- dr Johnsie Cancel---  hx nstemi 08-24-2020, s/p cabg x4 08-26-2020   Foley catheter in place    GERD (gastroesophageal reflux disease)    watches diet   History of non-ST elevation myocardial infarction (NSTEMI) 08/24/2020   Hypertension    RBBB (right bundle branch block with left anterior fascicular block)    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   LIMA to LAD, SVG to PDA, SVG to OM and Diagonal   Seasonal allergies    Past Surgical History:  Procedure Laterality Date   CORONARY ARTERY BYPASS GRAFT N/A 08/26/2020   Procedure: CORONARY ARTERY BYPASS GRAFTING (CABG), ON PUMP, TIMES FOUR, USING LEFT INTERNAL MAMMARY ARTERY AND RIGHT ENDOSCOPICALLY HARVESTED GREATER SAPHENOUS VEIN;  Surgeon: Wonda Olds, MD;  Location: [facility];  Service: Open Heart Surgery;  Laterality: N/A;   ENDOVEIN HARVEST OF GREATER SAPHENOUS VEIN Right 08/26/2020   Procedure: ENDOVEIN HARVEST OF GREATER SAPHENOUS VEIN;  Surgeon: Wonda Olds, MD;  Location: [place];  Service: Open Heart Surgery;  Laterality: Right;   LEFT HEART CATH AND CORONARY ANGIOGRAPHY N/A 08/24/2020   Procedure: LEFT HEART CATH AND CORONARY ANGIOGRAPHY;   Surgeon: Burnell Blanks, MD;  Location: [facility];  Service: Cardiovascular;  Laterality: N/A;   PROSTATECTOMY N/A 10/17/2012   Procedure: TRANSVESICLE PROSTATECTOMY SUPRAPUBIC;  Surgeon: Ailene Rud, MD;  Location: [facility];  Service: Urology;  Laterality: N/A;   TEE WITHOUT CARDIOVERSION N/A 08/26/2020   Procedure: TRANSESOPHAGEAL ECHOCARDIOGRAM (TEE);  Surgeon: Wonda Olds, MD;  Location: [place];  Service: Open Heart Surgery;  Laterality: N/A;   TRANSURETHRAL RESECTION OF PROSTATE N/A 11/25/2020   Procedure: TRANSURETHRAL RESECTION OF THE PROSTATE (TURP);  Surgeon: Festus Aloe, MD;  Location: [facility];  Service: Urology;  Laterality: N/A;  [address] [place] EXTRACTION      Allergies  No Known Allergies  History of Present Illness    First seen by me 08/24/20 at [facility] ER for accelerating angina with mildly elevated troponin 85. Chronic RBBB Cath 08/24/20 with 50% LM, 99% ostial LAD, 50% dominant mid circumflex LAD thought to be functional total occlusion EF normal Subsequently had CABG with Dr Orvan Seen with LIMA to LAD, SVG to PDA, Sequential SVG to OM and Diagonal Vein harvest from right thigh and leg Pre CABG dopplers plaque no stenosis .  He has some paresthesias sternotomy Needs to get into cardiac rehab  EKGs/Labs/Other Studies Reviewed:   The following studies were reviewed today:  Echo 08/25/20  1. Left ventricular ejection fraction, by estimation, is 55 to 60%. The  left ventricle has normal function.  The left ventricle demonstrates  regional wall motion abnormalities with apical hypokinesis. Left  ventricular diastolic parameters are consistent  with Grade I diastolic dysfunction (impaired relaxation).   2. Right ventricular systolic function is normal. The right ventricular  size is normal. Tricuspid regurgitation signal is inadequate for assessing  PA pressure.   3. The mitral valve is normal in structure. No evidence of  mitral valve  regurgitation. No evidence of mitral stenosis.   4. The aortic valve is tricuspid. Aortic valve regurgitation is not  visualized. Mild aortic valve sclerosis is present, with no evidence of  aortic valve stenosis.   5. The IVC was not visualized.  VAS [place] pre CABG 08/25/20 Summary:  Right Carotid: Velocities in the right ICA are consistent with a 1-39%  stenosis.   Left Carotid: Velocities in the left ICA are consistent with a 1-39%  stenosis.  Vertebrals: Bilateral vertebral arteries demonstrate antegrade flow.  Subclavians: Normal flow hemodynamics were seen in bilateral subclavian               arteries.  LHC 08/24/20  Ost Cx to Prox Cx lesion is 40% stenosed. Dist Cx lesion is 40% stenosed. Ost LM to Mid LM lesion is 50% stenosed. Ost LAD to Prox LAD lesion is 99% stenosed. The left ventricular systolic function is normal. LV end diastolic pressure is normal. The left ventricular ejection fraction is greater than 65% by visual estimate. There is no mitral valve regurgitation.   1. Moderate distal left main stenosis 2. Severe, heavily calcified ostial/proximal LAD stenosis. This is a functional chronic occlusion. The LAD also fills from right to left collaterals through a small, early branch. 3. Mild disease in the dominant Circumflex 4. Small non-dominant RCA 5. Normal LV systolic function   Recommendations: We will ask  CT surgery to see him tomorrow to review candidacy for bypass surgery. I think CABG is the best option for revascularization. I have reviewed the films with Dr. Fletcher Anon as well. Continue ASA and statin. Will restart IV heparin 6 hours post sheath pull. EKG:  EKG is ordered today.  The ekg ordered today demonstrates normal sinus rhythm 100bpm, RBBB and LAFB have returned since last tracing. Stable TWI in V2. No acute St/T wave changes.   Recent Labs: 08/23/2020: ALT 32; B Natriuretic Peptide 56.6 08/27/2020: Magnesium 2.4 09/16/2020: Platelets  766 11/25/2020: BUN 13; Creatinine, Ser 0.90; Hemoglobin 13.9; Potassium 4.3; Sodium 138  Recent Lipid Panel    Component Value Date/Time   CHOL 201 (H) 08/24/2020 2022   TRIG 131 08/24/2020 2022   HDL 53 08/24/2020 2022   CHOLHDL 3.8 08/24/2020 2022   VLDL 26 08/24/2020 2022   LDLCALC 122 (H) 08/24/2020 2022    Home Medications   Current Meds  Medication Sig   aspirin EC 81 MG tablet Take 81 mg by mouth daily. Swallow whole.   cetirizine (ZYRTEC) 10 MG tablet Take 10 mg by mouth daily.   Cholecalciferol (VITAMIN D3) 50 MCG (2000 UT) TABS Take by mouth daily.   clopidogrel (PLAVIX) 75 MG tablet Take 1 tablet (75 mg total) by mouth daily.   finasteride (PROSCAR) 5 MG tablet Take 5 mg by mouth daily.   rosuvastatin (CRESTOR) 20 MG tablet Take 1 tablet (20 mg total) by mouth daily. (Patient taking differently: Take 20 mg by mouth daily.)     Review of Systems    All other systems reviewed and are otherwise negative except as noted above.  Physical Exam  VS:  BP (!) 150/78   Pulse 75   Ht 5\' 9"  (1.753 m)   Wt 72.1 kg   SpO2 99%   BMI 23.48 kg/m  , BMI Body mass index is 23.48 kg/m.  Wt Readings from Last 3 Encounters:  12/23/20 72.1 kg  11/25/20 72 kg  09/22/20 71.2 kg    GEN: Well nourished, well developed, in no acute distress. HEENT: normal. Neck: Supple, no JVD, carotid bruits, or masses. Cardiac: RRR, no murmurs, rubs, or gallops. No clubbing, cyanosis, edema.  Radials/DP/PT 2+ and equal bilaterally.  Respiratory:  Respirations regular and unlabored, clear to auscultation bilaterally. GI: Soft, nontender, nondistended. MS: No deformity or atrophy. Skin: Warm and dry, no rash. Midsternal incision with edges well approximated. Chest tube suture sites with steri strips. No exudate, erythema, signs of infection.  Neuro:  Strength and sensation are intact. Psych: Normal affect.  Assessment & Plan    CAD s/p CABG -  08/26/20 normal LV continue ASA/Beta blocker and  statin refer again to cardiac rehab   HTN - BP well controlled. Continue current antihypertensive regimen.    GERD - Continue Protonix.   HLD, LDL goal <70 - Intolerance to Atorvastatin with dizziness. On crestor 20 mg daily needs f/u lipid and liver   Carotid artery disease - 08/2020 bilateral 1-39% ICA stenosis. Continue aspirin, statin.   Lipid and Liver   Disposition: Follow up  6 months   Signed, Jenkins Rouge, MD 12/23/2020, 9:07 AM [place]

## 2020-12-20 ENCOUNTER — Other Ambulatory Visit: Payer: Self-pay | Admitting: Physician Assistant

## 2020-12-23 ENCOUNTER — Other Ambulatory Visit: Payer: Self-pay

## 2020-12-23 ENCOUNTER — Ambulatory Visit: Payer: Medicare HMO | Admitting: Cardiovascular Disease

## 2020-12-23 ENCOUNTER — Encounter: Payer: Self-pay | Admitting: Cardiovascular Disease

## 2020-12-23 VITALS — BP 150/78 | HR 75 | Ht 69.0 in | Wt 159.0 lb

## 2020-12-23 DIAGNOSIS — I25118 Atherosclerotic heart disease of native coronary artery with other forms of angina pectoris: Secondary | ICD-10-CM | POA: Diagnosis not present

## 2020-12-23 DIAGNOSIS — I6523 Occlusion and stenosis of bilateral carotid arteries: Secondary | ICD-10-CM

## 2020-12-23 DIAGNOSIS — Z006 Encounter for examination for normal comparison and control in clinical research program: Secondary | ICD-10-CM

## 2020-12-23 DIAGNOSIS — E785 Hyperlipidemia, unspecified: Secondary | ICD-10-CM | POA: Diagnosis not present

## 2020-12-23 NOTE — Patient Instructions (Addendum)
Medication Instructions:  *If you need a refill on your cardiac medications before your next appointment, please call your pharmacy*  Lab Work: Your physician recommends that you return for lab work in: 1 week  for fasting Lipid and Liver Panel  If you have labs (blood work) drawn today and your tests are completely normal, you will receive your results only by: [place] (if you have MyChart) OR A paper copy in the mail If you have any lab test that is abnormal or we need to change your treatment, we will call you to review the results.  Testing/Procedures: None ordered today.  Follow-Up: At [facility], you and your health needs are our priority.  As part of our continuing mission to provide you with exceptional heart care, we have created designated Provider Care Teams.  These Care Teams include your primary Cardiologist (physician) and Advanced Practice Providers (APPs -  Physician Assistants and Nurse Practitioners) who all work together to provide you with the care you need, when you need it.  We recommend signing up for the patient portal called "MyChart".  Sign up information is provided on this After Visit Summary.  MyChart is used to connect with patients for Virtual Visits (Telemedicine).  Patients are able to view lab/test results, encounter notes, upcoming appointments, etc.  Non-urgent messages can be sent to your provider as well.   To learn more about what you can do with MyChart, go to [email or web address].    Your next appointment:   6 month(s)  The format for your next appointment:   In Person  Provider:   You may see Jenkins Rouge, MD or one of the following Advanced Practice Providers on your designated Care Team:   Cecilie Kicks, NP   You have been referred to Cardiac Rehab.

## 2020-12-23 NOTE — Research (Signed)
Called and spoke to patient about Lpa study. He is going to call and speak to his cardiologist. Will call patient back.

## 2020-12-30 ENCOUNTER — Other Ambulatory Visit: Payer: Self-pay

## 2020-12-30 ENCOUNTER — Other Ambulatory Visit: Payer: Medicare HMO | Admitting: *Deleted

## 2020-12-30 DIAGNOSIS — I6523 Occlusion and stenosis of bilateral carotid arteries: Secondary | ICD-10-CM

## 2020-12-30 DIAGNOSIS — E785 Hyperlipidemia, unspecified: Secondary | ICD-10-CM

## 2020-12-30 DIAGNOSIS — I25118 Atherosclerotic heart disease of native coronary artery with other forms of angina pectoris: Secondary | ICD-10-CM

## 2020-12-30 LAB — HEPATIC FUNCTION PANEL
ALT: 30 IU/L (ref 0–44)
AST: 24 IU/L (ref 0–40)
Albumin: 4.6 g/dL (ref 3.8–4.8)
Alkaline Phosphatase: 80 IU/L (ref 44–121)
Bilirubin Total: 0.5 mg/dL (ref 0.0–1.2)
Bilirubin, Direct: 0.14 mg/dL (ref 0.00–0.40)
Total Protein: 7.3 g/dL (ref 6.0–8.5)

## 2020-12-30 LAB — LIPID PANEL
Chol/HDL Ratio: 2.8 ratio (ref 0.0–5.0)
Cholesterol, Total: 139 mg/dL (ref 100–199)
HDL: 50 mg/dL (ref >39)
LDL Chol Calc (NIH): 72 mg/dL (ref 0–99)
Triglycerides: 87 mg/dL (ref 0–149)
VLDL Cholesterol Cal: 17 mg/dL (ref 5–40)

## 2021-01-04 ENCOUNTER — Other Ambulatory Visit: Payer: Self-pay | Admitting: Physician Assistant

## 2021-01-18 ENCOUNTER — Encounter (HOSPITAL_COMMUNITY): Payer: Self-pay

## 2021-01-31 ENCOUNTER — Telehealth (HOSPITAL_COMMUNITY): Payer: Self-pay

## 2021-01-31 NOTE — Telephone Encounter (Signed)
Called patient to see if he was interested in participating in the Cardiac Rehab Program. Patient stated yes. Patient will come in for orientation on 02/07/21 @ 10:30AM and will attend the 10:30AM exercise class.   Tourist information centre manager.

## 2021-01-31 NOTE — Telephone Encounter (Signed)
**  UPDATED**   Pt insurance is active and benefits verified through [facility]. Co-pay $10.00, DED $0.00/$0.00 met, out of pocket $3,900.00/$2,261.80 met, co-insurance 0%. No pre-authorization required. Passport, 01/31/21 @ 2:44PM, [id number]

## 2021-02-06 ENCOUNTER — Telehealth (HOSPITAL_COMMUNITY): Payer: Self-pay | Admitting: *Deleted

## 2021-02-06 NOTE — Telephone Encounter (Signed)
Spoke with Mr Ager. Completed part of  health history over the phone.. Confirmed orientation. Will obtain the rest of the information in person.Barnet Pall, RN,BSN 02/06/2021 4:47 PM

## 2021-02-07 ENCOUNTER — Encounter (HOSPITAL_COMMUNITY): Payer: Self-pay

## 2021-02-07 ENCOUNTER — Other Ambulatory Visit: Payer: Self-pay

## 2021-02-07 ENCOUNTER — Encounter (HOSPITAL_COMMUNITY)
Admission: RE | Admit: 2021-02-07 | Discharge: 2021-02-07 | Disposition: A | Discharge status: Home / Self Care | Payer: Medicare HMO | Source: Ambulatory Visit | Attending: Cardiovascular Disease | Admitting: Cardiovascular Disease

## 2021-02-07 VITALS — BP 138/64 | HR 78 | Ht 68.25 in | Wt 160.5 lb

## 2021-02-07 DIAGNOSIS — Z951 Presence of aortocoronary bypass graft: Secondary | ICD-10-CM | POA: Insufficient documentation

## 2021-02-07 HISTORY — DX: Hyperlipidemia, unspecified: E78.5

## 2021-02-07 NOTE — Progress Notes (Signed)
Cardiac Individual Treatment Plan  Patient Details  Name: Tony Shaffer MRN: [id number] Date of Birth: [birth year] Referring Provider:   Flowsheet Row CARDIAC REHAB PHASE II ORIENTATION from 02/07/2021 in [facility]  Referring Provider Josue Hector, MD       Initial Encounter Date:  [facility] from 02/07/2021 in [facility]  Date 02/07/21       Visit Diagnosis: S/P CABG x 4  Patient's Home Medications on Admission:  Current Outpatient Medications:    aspirin EC 81 MG tablet, Take 81 mg by mouth daily. Swallow whole., Disp: , Rfl:    cetirizine (ZYRTEC) 10 MG tablet, Take 10 mg by mouth daily., Disp: , Rfl:    Cholecalciferol (VITAMIN D3) 50 MCG (2000 UT) TABS, Take 2,000 Units by mouth daily., Disp: , Rfl:    clopidogrel (PLAVIX) 75 MG tablet, Take 1 tablet (75 mg total) by mouth daily., Disp: 90 tablet, Rfl: 1   finasteride (PROSCAR) 5 MG tablet, Take 5 mg by mouth daily., Disp: , Rfl:    hydrALAZINE (APRESOLINE) 50 MG tablet, Take 50 mg by mouth 2 (two) times daily., Disp: , Rfl:    metoprolol tartrate (LOPRESSOR) 50 MG tablet, Take 1 tablet (50 mg total) by mouth 2 (two) times daily., Disp: 30 tablet, Rfl: 5   rosuvastatin (CRESTOR) 20 MG tablet, Take 1 tablet (20 mg total) by mouth daily., Disp: 30 tablet, Rfl: 5   neomycin-bacitracin-polymyxin (NEOSPORIN) ointment, Apply 1 application topically daily as needed (rash). (Patient not taking: Reported on 02/07/2021), Disp: , Rfl:   Past Medical History: Past Medical History:  Diagnosis Date   Anticoagulated by anticoagulation treatment    plavix--- managed by cardiology   Benign prostatic hyperplasia with urinary retention    urologist-- dr Junious Silk;   s/p prostatectomy in  08/ 2014 for very enlarged prostate   Coronary artery disease    cardiologist--- dr Johnsie Cancel---  hx nstemi 08-24-2020, s/p cabg x4 08-26-2020   Foley  catheter in place    GERD (gastroesophageal reflux disease)    watches diet   History of non-ST elevation myocardial infarction (NSTEMI) 08/24/2020   Hyperlipidemia    Hypertension    RBBB (right bundle branch block with left anterior fascicular block)    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   LIMA to LAD, SVG to PDA, SVG to OM and Diagonal   Seasonal allergies     Tobacco Use: Social History   Tobacco Use  Smoking Status Never  Smokeless Tobacco Never    Labs: Recent Review Flowsheet Data     Labs for ITP Cardiac and Pulmonary Rehab Latest Ref Rng & Units 08/26/2020 08/26/2020 08/26/2020 11/25/2020 12/30/2020   Cholestrol 100 - 199 mg/dL - - - - 139   LDLCALC 0 - 99 mg/dL - - - - 72   HDL >39 mg/dL - - - - 50   Trlycerides 0 - 149 mg/dL - - - - 87   Hemoglobin A1c 4.8 - 5.6 % - - - - -   PHART 7.350 - 7.450 7.358 7.332(L) 7.344(L) - -   PCO2ART 32.0 - 48.0 mmHg 33.2 43.2 41.6 - -   HCO3 20.0 - 28.0 mmol/L 19.1(L) 22.7 22.4 - -   TCO2 22 - 32 mmol/L 20(L) 24 24 20(L) -   ACIDBASEDEF 0.0 - 2.0 mmol/L 6.0(H) 3.0(H) 3.0(H) - -   O2SAT % 99.0 98.0 94.0 - -  Capillary Blood Glucose: Lab Results  Component Value Date   GLUCAP 120 (H) 08/29/2020   GLUCAP 103 (H) 08/29/2020   GLUCAP 93 08/29/2020   GLUCAP 106 (H) 08/29/2020   GLUCAP 111 (H) 08/28/2020     Exercise Target Goals: Exercise Program Goal: Individual exercise prescription set using results from initial 6 min walk test and THRR while considering  patient's activity barriers and safety.   Exercise Prescription Goal: Starting with aerobic activity 30 plus minutes a day, 3 days per week for initial exercise prescription. Provide home exercise prescription and guidelines that participant acknowledges understanding prior to discharge.  Activity Barriers & Risk Stratification:  Activity Barriers & Cardiac Risk Stratification - 02/07/21 1115       Activity Barriers & Cardiac Risk Stratification   Activity Barriers History  of Falls    Cardiac Risk Stratification High             6 Minute Walk:  6 Minute Walk     Row Name 02/07/21 1143         6 Minute Walk   Phase Initial     Distance 1857 feet     Walk Time 6 minutes     # of Rest Breaks 0     MPH 3.52     METS 4.48     RPE 12     Perceived Dyspnea  1     VO2 Peak 15.67     Symptoms Yes (comment)     Comments Mild shortness of breath.     Resting HR 78 bpm     Resting BP 138/64     Resting Oxygen Saturation  99 %     Exercise Oxygen Saturation  during 6 min walk 97 %     Max Ex. HR 116 bpm     Max Ex. BP 158/80     2 Minute Post BP 160/70              Oxygen Initial Assessment:   Oxygen Re-Evaluation:   Oxygen Discharge (Final Oxygen Re-Evaluation):   Initial Exercise Prescription:  Initial Exercise Prescription - 02/07/21 1300       Date of Initial Exercise RX and Referring Provider   Date 02/07/21    Referring Provider Josue Hector, MD    Expected Discharge Date 04/07/21      Bike   Level 2    Minutes 15    METs 3.2      NuStep   Level 3    SPM 85    Minutes 15    METs 3      Prescription Details   Frequency (times per week) 3    Duration Progress to 30 minutes of continuous aerobic without signs/symptoms of physical distress      Intensity   THRR 40-80% of Max Heartrate 60-120    Ratings of Perceived Exertion 11-13    Perceived Dyspnea 0-4      Progression   Progression Continue to progress workloads to maintain intensity without signs/symptoms of physical distress.      Resistance Training   Training Prescription Yes    Weight 4 lbs    Reps 10-15             Perform Capillary Blood Glucose checks as needed.  Exercise Prescription Changes:   Exercise Comments:   Exercise Goals and Review:   Exercise Goals     Row Name 02/07/21 1022  Exercise Goals   Increase Physical Activity Yes       Intervention Provide advice, education, support and counseling about  physical activity/exercise needs.;Develop an individualized exercise prescription for aerobic and resistive training based on initial evaluation findings, risk stratification, comorbidities and participant's personal goals.       Expected Outcomes Short Term: Attend rehab on a regular basis to increase amount of physical activity.;Long Term: Exercising regularly at least 3-5 days a week.;Long Term: Add in home exercise to make exercise part of routine and to increase amount of physical activity.       Increase Strength and Stamina Yes       Intervention Provide advice, education, support and counseling about physical activity/exercise needs.;Develop an individualized exercise prescription for aerobic and resistive training based on initial evaluation findings, risk stratification, comorbidities and participant's personal goals.       Expected Outcomes Short Term: Increase workloads from initial exercise prescription for resistance, speed, and METs.;Short Term: Perform resistance training exercises routinely during rehab and add in resistance training at home;Long Term: Improve cardiorespiratory fitness, muscular endurance and strength as measured by increased METs and functional capacity (6MWT)       Able to understand and use rate of perceived exertion (RPE) scale Yes       Intervention Provide education and explanation on how to use RPE scale       Expected Outcomes Short Term: Able to use RPE daily in rehab to express subjective intensity level;Long Term:  Able to use RPE to guide intensity level when exercising independently       Knowledge and understanding of Target Heart Rate Range (THRR) Yes       Intervention Provide education and explanation of THRR including how the numbers were predicted and where they are located for reference       Expected Outcomes Short Term: Able to state/look up THRR;Long Term: Able to use THRR to govern intensity when exercising independently;Short Term: Able to use  daily as guideline for intensity in rehab       Able to check pulse independently Yes       Intervention Provide education and demonstration on how to check pulse in carotid and radial arteries.;Review the importance of being able to check your own pulse for safety during independent exercise       Expected Outcomes Short Term: Able to explain why pulse checking is important during independent exercise;Long Term: Able to check pulse independently and accurately       Understanding of Exercise Prescription Yes       Intervention Provide education, explanation, and written materials on patient's individual exercise prescription       Expected Outcomes Short Term: Able to explain program exercise prescription;Long Term: Able to explain home exercise prescription to exercise independently                Exercise Goals Re-Evaluation :    Discharge Exercise Prescription (Final Exercise Prescription Changes):   Nutrition:  Target Goals: Understanding of nutrition guidelines, daily intake of sodium 1500mg , cholesterol 200mg , calories 30% from fat and 7% or less from saturated fats, daily to have 5 or more servings of fruits and vegetables.  Biometrics:  Pre Biometrics - 02/07/21 1020       Pre Biometrics   Waist Circumference 34.5 inches    Hip Circumference 38.5 inches    Waist to Hip Ratio 0.9 %    Triceps Skinfold 14 mm    % Body Fat 23.5 %  Grip Strength 42 kg    Flexibility 12.5 in    Single Leg Stand 30 seconds              Nutrition Therapy Plan and Nutrition Goals:   Nutrition Assessments:  MEDIFICTS Score Key: ?70 Need to make dietary changes  40-70 Heart Healthy Diet ? 40 Therapeutic Level Cholesterol Diet   Picture Your Plate Scores: <16 Unhealthy dietary pattern with much room for improvement. 41-50 Dietary pattern unlikely to meet recommendations for good health and room for improvement. 51-60 More healthful dietary pattern, with some room for  improvement.  >60 Healthy dietary pattern, although there may be some specific behaviors that could be improved.    Nutrition Goals Re-Evaluation:   Nutrition Goals Discharge (Final Nutrition Goals Re-Evaluation):   Psychosocial: Target Goals: Acknowledge presence or absence of significant depression and/or stress, maximize coping skills, provide positive support system. Participant is able to verbalize types and ability to use techniques and skills needed for reducing stress and depression.  Initial Review & Psychosocial Screening:  Initial Psych Review & Screening - 02/07/21 1126       Initial Review   Current issues with Current Stress Concerns    Source of Stress Concerns Financial      Family Dynamics   Good Support System? Yes   Montrez lives with his brother whom he has for support along with his church family     Barriers   Psychosocial barriers to participate in program The patient should benefit from training in stress management and relaxation.      Screening Interventions   Interventions Encouraged to exercise;Provide feedback about the scores to participant    Expected Outcomes Long Term Goal: Stressors or current issues are controlled or eliminated.             Quality of Life Scores:  Quality of Life - 02/07/21 1306       Quality of Life   Select Quality of Life      Quality of Life Scores   Health/Function Pre 29.1 %    Socioeconomic Pre 28.33 %    Psych/Spiritual Pre 30 %    Family Pre 30 %    GLOBAL Pre 29.27 %            Scores of 19 and below usually indicate a poorer quality of life in these areas.  A difference of  2-3 points is a clinically meaningful difference.  A difference of 2-3 points in the total score of the Quality of Life Index has been associated with significant improvement in overall quality of life, self-image, physical symptoms, and general health in studies assessing change in quality of life.  PHQ-9: Recent Review  Flowsheet Data     Depression screen [facility] 2/9 02/07/2021   Decreased Interest 0   Down, Depressed, Hopeless 0   PHQ - 2 Score 0      Interpretation of Total Score  Total Score Depression Severity:  1-4 = Minimal depression, 5-9 = Mild depression, 10-14 = Moderate depression, 15-19 = Moderately severe depression, 20-27 = Severe depression   Psychosocial Evaluation and Intervention:   Psychosocial Re-Evaluation:   Psychosocial Discharge (Final Psychosocial Re-Evaluation):   Vocational Rehabilitation: Provide vocational rehab assistance to qualifying candidates.   Vocational Rehab Evaluation & Intervention:  Vocational Rehab - 02/07/21 1129       Initial Vocational Rehab Evaluation & Intervention   Assessment shows need for Vocational Rehabilitation No   Elieser is retired and does  not need vocational rehab at this time            Education: Education Goals: Education classes will be provided on a weekly basis, covering required topics. Participant will state understanding/return demonstration of topics presented.  Learning Barriers/Preferences:  Learning Barriers/Preferences - 02/07/21 1128       Learning Barriers/Preferences   Learning Barriers Exercise Concerns   Willam says he sometimes feels dizzy after taking his medication   Learning Preferences Audio;Computer/Internet;Group Instruction;Individual Instruction;Pictoral;Skilled Demonstration;Verbal Instruction;Video;Written Material             Education Topics: Hypertension, Hypertension Reduction -Define heart disease and high blood pressure. Discus how high blood pressure affects the body and ways to reduce high blood pressure.   Exercise and Your Heart -Discuss why it is important to exercise, the FITT principles of exercise, normal and abnormal responses to exercise, and how to exercise safely.   Angina -Discuss definition of angina, causes of angina, treatment of angina, and how to decrease risk  of having angina.   Cardiac Medications -Review what the following cardiac medications are used for, how they affect the body, and side effects that may occur when taking the medications.  Medications include Aspirin, Beta blockers, calcium channel blockers, ACE Inhibitors, angiotensin receptor blockers, diuretics, digoxin, and antihyperlipidemics.   Congestive Heart Failure -Discuss the definition of CHF, how to live with CHF, the signs and symptoms of CHF, and how keep track of weight and sodium intake.   Heart Disease and Intimacy -Discus the effect sexual activity has on the heart, how changes occur during intimacy as we age, and safety during sexual activity.   Smoking Cessation / COPD -Discuss different methods to quit smoking, the health benefits of quitting smoking, and the definition of COPD.   Nutrition I: Fats -Discuss the types of cholesterol, what cholesterol does to the heart, and how cholesterol levels can be controlled.   Nutrition II: Labels -Discuss the different components of food labels and how to read food label   Heart Parts/Heart Disease and PAD -Discuss the anatomy of the heart, the pathway of blood circulation through the heart, and these are affected by heart disease.   Stress I: Signs and Symptoms -Discuss the causes of stress, how stress may lead to anxiety and depression, and ways to limit stress.   Stress II: Relaxation -Discuss different types of relaxation techniques to limit stress.   Warning Signs of Stroke / TIA -Discuss definition of a stroke, what the signs and symptoms are of a stroke, and how to identify when someone is having stroke.   Knowledge Questionnaire Score:  Knowledge Questionnaire Score - 02/07/21 1306       Knowledge Questionnaire Score   Pre Score 19/24             Core Components/Risk Factors/Patient Goals at Admission:  Personal Goals and Risk Factors at Admission - 02/07/21 1113       Core  Components/Risk Factors/Patient Goals on Admission    Weight Management Yes;Weight Maintenance    Intervention Weight Management: Develop a combined nutrition and exercise program designed to reach desired caloric intake, while maintaining appropriate intake of nutrient and fiber, sodium and fats, and appropriate energy expenditure required for the weight goal.;Weight Management: Provide education and appropriate resources to help participant work on and attain dietary goals.    Expected Outcomes Weight Maintenance: Understanding of the daily nutrition guidelines, which includes 25-35% calories from fat, 7% or less cal from saturated fats, less than 200mg   cholesterol, less than 1.5gm of sodium, & 5 or more servings of fruits and vegetables daily;Long Term: Adherence to nutrition and physical activity/exercise program aimed toward attainment of established weight goal    Hypertension Yes    Intervention Provide education on lifestyle modifcations including regular physical activity/exercise, weight management, moderate sodium restriction and increased consumption of fresh fruit, vegetables, and low fat dairy, alcohol moderation, and smoking cessation.;Monitor prescription use compliance.    Expected Outcomes Short Term: Continued assessment and intervention until BP is < 140/55mm HG in hypertensive participants. < 130/41mm HG in hypertensive participants with diabetes, heart failure or chronic kidney disease.;Long Term: Maintenance of blood pressure at goal levels.    Lipids Yes    Intervention Provide education and support for participant on nutrition & aerobic/resistive exercise along with prescribed medications to achieve LDL 70mg , HDL >40mg .    Expected Outcomes Short Term: Participant states understanding of desired cholesterol values and is compliant with medications prescribed. Participant is following exercise prescription and nutrition guidelines.;Long Term: Cholesterol controlled with medications  as prescribed, with individualized exercise RX and with personalized nutrition plan. Value goals: LDL < 70mg , HDL > 40 mg.    Personal Goal Other Yes    Personal Goal Breath better. Get nutrition infomration regarding heart healthy eating.    Intervention Provide education regarding heart healthy eating, lifestyle modification, dining out, and reading food labels. Provide education reagarding aerobic exercise to help improve cardiorespiratory fitness.    Expected Outcomes Participant knows heart healthy eating habits. Participant is following exercise prescription and nutrition guidelines.             Core Components/Risk Factors/Patient Goals Review:    Core Components/Risk Factors/Patient Goals at Discharge (Final Review):    ITP Comments:  ITP Comments     Row Name 02/07/21 1020           ITP Comments Medical Director- Dr. Fransico Him, MD                Comments: Gwyndolyn Saxon attended orientation on 02/07/2021 to review rules and guidelines for program.  Completed 6 minute walk test, Intitial ITP, and exercise prescription.  VSS. Telemetry-Sinus Rhythm, bundle branch block.  Murdock has mild shortness of breath  otherwise Asymptomatic. Safety measures and social distancing in place per CDC guidelines.Barnet Pall, RN,BSN 02/07/2021 1:48 PM

## 2021-02-07 NOTE — Progress Notes (Signed)
Cardiac Rehab Medication Review by a Nurse  Does the patient  feel that his/her medications are working for him/her?  yes  Has the patient been experiencing any side effects to the medications prescribed?  no  Does the patient measure his/her own blood pressure or blood glucose at home?  yes   Does the patient have any problems obtaining medications due to transportation or finances?   no  Understanding of regimen: good Understanding of indications: fair Potential of compliance: good    Nurse comments: Tenoch is taking his medications as prescribed and has a fair understanding of what his medications are for.    Christa See [facility] RN  02/07/2021 11:24 AM

## 2021-02-13 ENCOUNTER — Other Ambulatory Visit: Payer: Self-pay

## 2021-02-13 ENCOUNTER — Encounter (HOSPITAL_COMMUNITY)
Admission: RE | Admit: 2021-02-13 | Discharge: 2021-02-13 | Disposition: A | Discharge status: Home / Self Care | Payer: Medicare HMO | Source: Ambulatory Visit | Attending: Cardiovascular Disease | Admitting: Cardiovascular Disease

## 2021-02-13 DIAGNOSIS — Z951 Presence of aortocoronary bypass graft: Secondary | ICD-10-CM | POA: Diagnosis not present

## 2021-02-13 NOTE — Progress Notes (Signed)
Cardiac Individual Treatment Plan  Patient Details  Name: Tony Shaffer MRN: [id number] Date of Birth: 09-27-1950 Referring Provider:   Flowsheet Row CARDIAC REHAB PHASE II ORIENTATION from 02/07/2021 in [place]  Referring Provider Josue Hector, MD       Initial Encounter Date:  [facility] from 02/07/2021 in [place]  Date 02/07/21       Visit Diagnosis: S/P CABG x 4  Patient's Home Medications on Admission:  Current Outpatient Medications:    aspirin EC 81 MG tablet, Take 81 mg by mouth daily. Swallow whole., Disp: , Rfl:    cetirizine (ZYRTEC) 10 MG tablet, Take 10 mg by mouth daily., Disp: , Rfl:    Cholecalciferol (VITAMIN D3) 50 MCG (2000 UT) TABS, Take 2,000 Units by mouth daily., Disp: , Rfl:    clopidogrel (PLAVIX) 75 MG tablet, Take 1 tablet (75 mg total) by mouth daily., Disp: 90 tablet, Rfl: 1   finasteride (PROSCAR) 5 MG tablet, Take 5 mg by mouth daily., Disp: , Rfl:    hydrALAZINE (APRESOLINE) 50 MG tablet, Take 50 mg by mouth 2 (two) times daily., Disp: , Rfl:    metoprolol tartrate (LOPRESSOR) 50 MG tablet, Take 1 tablet (50 mg total) by mouth 2 (two) times daily., Disp: 30 tablet, Rfl: 5   neomycin-bacitracin-polymyxin (NEOSPORIN) ointment, Apply 1 application topically daily as needed (rash). (Patient not taking: Reported on 02/07/2021), Disp: , Rfl:    rosuvastatin (CRESTOR) 20 MG tablet, Take 1 tablet (20 mg total) by mouth daily., Disp: 30 tablet, Rfl: 5  Past Medical History: Past Medical History:  Diagnosis Date   Anticoagulated by anticoagulation treatment    plavix--- managed by cardiology   Benign prostatic hyperplasia with urinary retention    urologist-- dr Junious Silk;   s/p prostatectomy in  08/ 2014 for very enlarged prostate   Coronary artery disease    cardiologist--- dr Tony Shaffer---  hx nstemi 08-24-2020, s/p cabg x4 08-26-2020   Foley  catheter in place    GERD (gastroesophageal reflux disease)    watches diet   History of non-ST elevation myocardial infarction (NSTEMI) 08/24/2020   Hyperlipidemia    Hypertension    RBBB (right bundle branch block with left anterior fascicular block)    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   LIMA to LAD, SVG to PDA, SVG to OM and Diagonal   Seasonal allergies     Tobacco Use: Social History   Tobacco Use  Smoking Status Never  Smokeless Tobacco Never    Labs: Recent Review Flowsheet Data     Labs for ITP Cardiac and Pulmonary Rehab Latest Ref Rng & Units 08/26/2020 08/26/2020 08/26/2020 11/25/2020 12/30/2020   Cholestrol 100 - 199 mg/dL - - - - 139   LDLCALC 0 - 99 mg/dL - - - - 72   HDL >39 mg/dL - - - - 50   Trlycerides 0 - 149 mg/dL - - - - 87   Hemoglobin A1c 4.8 - 5.6 % - - - - -   PHART 7.350 - 7.450 7.358 7.332(L) 7.344(L) - -   PCO2ART 32.0 - 48.0 mmHg 33.2 43.2 41.6 - -   HCO3 20.0 - 28.0 mmol/L 19.1(L) 22.7 22.4 - -   TCO2 22 - 32 mmol/L 20(L) 24 24 20(L) -   ACIDBASEDEF 0.0 - 2.0 mmol/L 6.0(H) 3.0(H) 3.0(H) - -   O2SAT % 99.0 98.0 94.0 - -  Capillary Blood Glucose: Lab Results  Component Value Date   GLUCAP 120 (H) 08/29/2020   GLUCAP 103 (H) 08/29/2020   GLUCAP 93 08/29/2020   GLUCAP 106 (H) 08/29/2020   GLUCAP 111 (H) 08/28/2020     Exercise Target Goals: Exercise Program Goal: Individual exercise prescription set using results from initial 6 min walk test and THRR while considering  patient's activity barriers and safety.   Exercise Prescription Goal: Initial exercise prescription builds to 30-45 minutes a day of aerobic activity, 2-3 days per week.  Home exercise guidelines will be given to patient during program as part of exercise prescription that the participant will acknowledge.  Activity Barriers & Risk Stratification:  Activity Barriers & Cardiac Risk Stratification - 02/07/21 1115       Activity Barriers & Cardiac Risk Stratification    Activity Barriers History of Falls    Cardiac Risk Stratification High             6 Minute Walk:  6 Minute Walk     Row Name 02/07/21 1143         6 Minute Walk   Phase Initial     Distance 1857 feet     Walk Time 6 minutes     # of Rest Breaks 0     MPH 3.52     METS 4.48     RPE 12     Perceived Dyspnea  1     VO2 Peak 15.67     Symptoms Yes (comment)     Comments Mild shortness of breath.     Resting HR 78 bpm     Resting BP 138/64     Resting Oxygen Saturation  99 %     Exercise Oxygen Saturation  during 6 min walk 97 %     Max Ex. HR 116 bpm     Max Ex. BP 158/80     2 Minute Post BP 160/70              Oxygen Initial Assessment:   Oxygen Re-Evaluation:   Oxygen Discharge (Final Oxygen Re-Evaluation):   Initial Exercise Prescription:  Initial Exercise Prescription - 02/07/21 1300       Date of Initial Exercise RX and Referring Provider   Date 02/07/21    Referring Provider Josue Hector, MD    Expected Discharge Date 04/07/21      Bike   Level 2    Minutes 15    METs 3.2      NuStep   Level 3    SPM 85    Minutes 15    METs 3      Prescription Details   Frequency (times per week) 3    Duration Progress to 30 minutes of continuous aerobic without signs/symptoms of physical distress      Intensity   THRR 40-80% of Max Heartrate 60-120    Ratings of Perceived Exertion 11-13    Perceived Dyspnea 0-4      Progression   Progression Continue to progress workloads to maintain intensity without signs/symptoms of physical distress.      Resistance Training   Training Prescription Yes    Weight 4 lbs    Reps 10-15             Perform Capillary Blood Glucose checks as needed.  Exercise Prescription Changes:   Exercise Prescription Changes     Row Name 02/13/21 1030  Response to Exercise   Blood Pressure (Admit) 138/62       Blood Pressure (Exercise) 178/82       Blood Pressure (Exit) 142/70        Heart Rate (Admit) 99 bpm       Heart Rate (Exercise) 133 bpm       Heart Rate (Exit) 100 bpm       Rating of Perceived Exertion (Exercise) 12       Symptoms None       Comments Elevated systolic blood pressure with exercise. Moved from bike to track.       Duration Continue with 30 min of aerobic exercise without signs/symptoms of physical distress.       Intensity THRR unchanged         Progression   Progression Continue to progress workloads to maintain intensity without signs/symptoms of physical distress.       Average METs 2.4         Resistance Training   Training Prescription Yes       Weight 4 lbs       Reps 10-15       Time 10 Minutes         Interval Training   Interval Training No         Bike   Level 1       Minutes 9       METs 1.8         NuStep   Level 3       SPM 85       Minutes 15       METs 2.4         Track   Laps 9       Minutes 8       METs 2.96                Exercise Comments:   Exercise Comments     Row Name 02/13/21 1159           Exercise Comments Patient tolerated first session well. Systolic BP elevated at rest and with exercise. Moved from stationary bike to track due to elevated BP. Continue to monitor.                Exercise Goals and Review:   Exercise Goals     Row Name 02/07/21 1022             Exercise Goals   Increase Physical Activity Yes       Intervention Provide advice, education, support and counseling about physical activity/exercise needs.;Develop an individualized exercise prescription for aerobic and resistive training based on initial evaluation findings, risk stratification, comorbidities and participant's personal goals.       Expected Outcomes Short Term: Attend rehab on a regular basis to increase amount of physical activity.;Long Term: Exercising regularly at least 3-5 days a week.;Long Term: Add in home exercise to make exercise part of routine and to increase amount of physical activity.        Increase Strength and Stamina Yes       Intervention Provide advice, education, support and counseling about physical activity/exercise needs.;Develop an individualized exercise prescription for aerobic and resistive training based on initial evaluation findings, risk stratification, comorbidities and participant's personal goals.       Expected Outcomes Short Term: Increase workloads from initial exercise prescription for resistance, speed, and METs.;Short Term: Perform resistance training exercises routinely during rehab and add in  resistance training at home;Long Term: Improve cardiorespiratory fitness, muscular endurance and strength as measured by increased METs and functional capacity (6MWT)       Able to understand and use rate of perceived exertion (RPE) scale Yes       Intervention Provide education and explanation on how to use RPE scale       Expected Outcomes Short Term: Able to use RPE daily in rehab to express subjective intensity level;Long Term:  Able to use RPE to guide intensity level when exercising independently       Knowledge and understanding of Target Heart Rate Range (THRR) Yes       Intervention Provide education and explanation of THRR including how the numbers were predicted and where they are located for reference       Expected Outcomes Short Term: Able to state/look up THRR;Long Term: Able to use THRR to govern intensity when exercising independently;Short Term: Able to use daily as guideline for intensity in rehab       Able to check pulse independently Yes       Intervention Provide education and demonstration on how to check pulse in carotid and radial arteries.;Review the importance of being able to check your own pulse for safety during independent exercise       Expected Outcomes Short Term: Able to explain why pulse checking is important during independent exercise;Long Term: Able to check pulse independently and accurately       Understanding of Exercise  Prescription Yes       Intervention Provide education, explanation, and written materials on patient's individual exercise prescription       Expected Outcomes Short Term: Able to explain program exercise prescription;Long Term: Able to explain home exercise prescription to exercise independently                Exercise Goals Re-Evaluation :  Exercise Goals Re-Evaluation     Tony Shaffer Name 02/13/21 1159             Exercise Goal Re-Evaluation   Exercise Goals Review Increase Physical Activity;Able to understand and use rate of perceived exertion (RPE) scale       Comments Patient able to understand and use RPE scale appropriately. Systolic BP elevated at rest and with exercise.       Expected Outcomes Progress workloads as tolerated keeping blood pressure within normal limits.                Discharge Exercise Prescription (Final Exercise Prescription Changes):  Exercise Prescription Changes - 02/13/21 1030       Response to Exercise   Blood Pressure (Admit) 138/62    Blood Pressure (Exercise) 178/82    Blood Pressure (Exit) 142/70    Heart Rate (Admit) 99 bpm    Heart Rate (Exercise) 133 bpm    Heart Rate (Exit) 100 bpm    Rating of Perceived Exertion (Exercise) 12    Symptoms None    Comments Elevated systolic blood pressure with exercise. Moved from bike to track.    Duration Continue with 30 min of aerobic exercise without signs/symptoms of physical distress.    Intensity THRR unchanged      Progression   Progression Continue to progress workloads to maintain intensity without signs/symptoms of physical distress.    Average METs 2.4      Resistance Training   Training Prescription Yes    Weight 4 lbs    Reps 10-15    Time 10 Minutes  Interval Training   Interval Training No      Bike   Level 1    Minutes 9    METs 1.8      NuStep   Level 3    SPM 85    Minutes 15    METs 2.4      Track   Laps 9    Minutes 8    METs 2.96              Nutrition:  Target Goals: Understanding of nutrition guidelines, daily intake of sodium 1500mg , cholesterol 200mg , calories 30% from fat and 7% or less from saturated fats, daily to have 5 or more servings of fruits and vegetables.  Biometrics:  Pre Biometrics - 02/07/21 1020       Pre Biometrics   Waist Circumference 34.5 inches    Hip Circumference 38.5 inches    Waist to Hip Ratio 0.9 %    Triceps Skinfold 14 mm    % Body Fat 23.5 %    Grip Strength 42 kg    Flexibility 12.5 in    Single Leg Stand 30 seconds              Nutrition Therapy Plan and Nutrition Goals:   Nutrition Assessments:  MEDIFICTS Score Key: ?70 Need to make dietary changes  40-70 Heart Healthy Diet ? 40 Therapeutic Level Cholesterol Diet    Picture Your Plate Scores: <14 Unhealthy dietary pattern with much room for improvement. 41-50 Dietary pattern unlikely to meet recommendations for good health and room for improvement. 51-60 More healthful dietary pattern, with some room for improvement.  >60 Healthy dietary pattern, although there may be some specific behaviors that could be improved.    Nutrition Goals Re-Evaluation:   Nutrition Goals Re-Evaluation:   Nutrition Goals Discharge (Final Nutrition Goals Re-Evaluation):   Psychosocial: Target Goals: Acknowledge presence or absence of significant depression and/or stress, maximize coping skills, provide positive support system. Participant is able to verbalize types and ability to use techniques and skills needed for reducing stress and depression.  Initial Review & Psychosocial Screening:  Initial Psych Review & Screening - 02/07/21 1126       Initial Review   Current issues with Current Stress Concerns    Source of Stress Concerns Financial      Family Dynamics   Good Support System? Yes   Tony Shaffer lives with his brother whom he has for support along with his church family     Barriers   Psychosocial barriers to  participate in program The patient should benefit from training in stress management and relaxation.      Screening Interventions   Interventions Encouraged to exercise;Provide feedback about the scores to participant    Expected Outcomes Long Term Goal: Stressors or current issues are controlled or eliminated.             Quality of Life Scores:  Quality of Life - 02/07/21 1306       Quality of Life   Select Quality of Life      Quality of Life Scores   Health/Function Pre 29.1 %    Socioeconomic Pre 28.33 %    Psych/Spiritual Pre 30 %    Family Pre 30 %    GLOBAL Pre 29.27 %            Scores of 19 and below usually indicate a poorer quality of life in these areas.  A difference of  2-3 points is  a clinically meaningful difference.  A difference of 2-3 points in the total score of the Quality of Life Index has been associated with significant improvement in overall quality of life, self-image, physical symptoms, and general health in studies assessing change in quality of life.  PHQ-9: Recent Review Flowsheet Data     Depression screen [facility] 2/9 02/07/2021   Decreased Interest 0   Down, Depressed, Hopeless 0   PHQ - 2 Score 0      Interpretation of Total Score  Total Score Depression Severity:  1-4 = Minimal depression, 5-9 = Mild depression, 10-14 = Moderate depression, 15-19 = Moderately severe depression, 20-27 = Severe depression   Psychosocial Evaluation and Intervention:   Psychosocial Re-Evaluation:  Psychosocial Re-Evaluation     Tony Shaffer Name 02/13/21 1429             Psychosocial Re-Evaluation   Current issues with Current Stress Concerns       Comments Tony Shaffer started exercise at cardiac rehab on 02/13/21       Expected Outcomes Tony Shaffer will have decreased or controlled stress upon completion of phase 2 cardiac rehab.       Interventions Stress management education;Encouraged to attend Cardiac Rehabilitation for the exercise       Continue  Psychosocial Services  No Follow up required         Initial Review   Source of Stress Concerns Financial       Comments Will continue to montior and offer support as needed                Psychosocial Discharge (Final Psychosocial Re-Evaluation):  Psychosocial Re-Evaluation - 02/13/21 1429       Psychosocial Re-Evaluation   Current issues with Current Stress Concerns    Comments Tony Shaffer started exercise at cardiac rehab on 02/13/21    Expected Outcomes Tony Shaffer will have decreased or controlled stress upon completion of phase 2 cardiac rehab.    Interventions Stress management education;Encouraged to attend Cardiac Rehabilitation for the exercise    Continue Psychosocial Services  No Follow up required      Initial Review   Source of Stress Concerns Financial    Comments Will continue to montior and offer support as needed             Vocational Rehabilitation: Provide vocational rehab assistance to qualifying candidates.   Vocational Rehab Evaluation & Intervention:  Vocational Rehab - 02/07/21 1129       Initial Vocational Rehab Evaluation & Intervention   Assessment shows need for Vocational Rehabilitation No   Tony Shaffer is retired and does not need vocational rehab at this time            Education: Education Goals: Education classes will be provided on a weekly basis, covering required topics. Participant will state understanding/return demonstration of topics presented.  Learning Barriers/Preferences:  Learning Barriers/Preferences - 02/07/21 1128       Learning Barriers/Preferences   Learning Barriers Exercise Concerns   Tony Shaffer says he sometimes feels dizzy after taking his medication   Learning Preferences Audio;Computer/Internet;Group Instruction;Individual Instruction;Pictoral;Skilled Demonstration;Verbal Instruction;Video;Written Material             Education Topics: Count Your Pulse:  -Group instruction provided by verbal instruction,  demonstration, patient participation and written materials to support subject.  Instructors address importance of being able to find your pulse and how to count your pulse when at home without a heart monitor.  Patients get hands on experience counting their pulse with  staff help and individually.   Heart Attack, Angina, and Risk Factor Modification:  -Group instruction provided by verbal instruction, video, and written materials to support subject.  Instructors address signs and symptoms of angina and heart attacks.    Also discuss risk factors for heart disease and how to make changes to improve heart health risk factors.   Functional Fitness:  -Group instruction provided by verbal instruction, demonstration, patient participation, and written materials to support subject.  Instructors address safety measures for doing things around the house.  Discuss how to get up and down off the floor, how to pick things up properly, how to safely get out of a chair without assistance, and balance training.   Meditation and Mindfulness:  -Group instruction provided by verbal instruction, patient participation, and written materials to support subject.  Instructor addresses importance of mindfulness and meditation practice to help reduce stress and improve awareness.  Instructor also leads participants through a meditation exercise.    Stretching for Flexibility and Mobility:  -Group instruction provided by verbal instruction, patient participation, and written materials to support subject.  Instructors lead participants through series of stretches that are designed to increase flexibility thus improving mobility.  These stretches are additional exercise for major muscle groups that are typically performed during regular warm up and cool down.   Hands Only CPR:  -Group verbal, video, and participation provides a basic overview of AHA guidelines for community CPR. Role-play of emergencies allow participants  the opportunity to practice calling for help and chest compression technique with discussion of AED use.   Hypertension: -Group verbal and written instruction that provides a basic overview of hypertension including the most recent diagnostic guidelines, risk factor reduction with self-care instructions and medication management.    Nutrition I class: Heart Healthy Eating:  -Group instruction provided by PowerPoint slides, verbal discussion, and written materials to support subject matter. The instructor gives an explanation and review of the Therapeutic Lifestyle Changes diet recommendations, which includes a discussion on lipid goals, dietary fat, sodium, fiber, plant stanol/sterol esters, sugar, and the components of a well-balanced, healthy diet.   Nutrition II class: Lifestyle Skills:  -Group instruction provided by PowerPoint slides, verbal discussion, and written materials to support subject matter. The instructor gives an explanation and review of label reading, grocery shopping for heart health, heart healthy recipe modifications, and ways to make healthier choices when eating out.   Diabetes Question & Answer:  -Group instruction provided by PowerPoint slides, verbal discussion, and written materials to support subject matter. The instructor gives an explanation and review of diabetes co-morbidities, pre- and post-prandial blood glucose goals, pre-exercise blood glucose goals, signs, symptoms, and treatment of hypoglycemia and hyperglycemia, and foot care basics.   Diabetes Blitz:  -Group instruction provided by PowerPoint slides, verbal discussion, and written materials to support subject matter. The instructor gives an explanation and review of the physiology behind type 1 and type 2 diabetes, diabetes medications and rational behind using different medications, pre- and post-prandial blood glucose recommendations and Hemoglobin A1c goals, diabetes diet, and exercise including blood  glucose guidelines for exercising safely.    Portion Distortion:  -Group instruction provided by PowerPoint slides, verbal discussion, written materials, and food models to support subject matter. The instructor gives an explanation of serving size versus portion size, changes in portions sizes over the last 20 years, and what consists of a serving from each food group.   Stress Management:  -Group instruction provided by verbal instruction, video, and written  materials to support subject matter.  Instructors review role of stress in heart disease and how to cope with stress positively.     Exercising on Your Own:  -Group instruction provided by verbal instruction, power point, and written materials to support subject.  Instructors discuss benefits of exercise, components of exercise, frequency and intensity of exercise, and end points for exercise.  Also discuss use of nitroglycerin and activating EMS.  Review options of places to exercise outside of rehab.  Review guidelines for sex with heart disease.   Cardiac Drugs I:  -Group instruction provided by verbal instruction and written materials to support subject.  Instructor reviews cardiac drug classes: antiplatelets, anticoagulants, beta blockers, and statins.  Instructor discusses reasons, side effects, and lifestyle considerations for each drug class.   Cardiac Drugs II:  -Group instruction provided by verbal instruction and written materials to support subject.  Instructor reviews cardiac drug classes: angiotensin converting enzyme inhibitors (ACE-I), angiotensin II receptor blockers (ARBs), nitrates, and calcium channel blockers.  Instructor discusses reasons, side effects, and lifestyle considerations for each drug class.   Anatomy and Physiology of the Circulatory System:  Group verbal and written instruction and models provide basic cardiac anatomy and physiology, with the coronary electrical and arterial systems. Review of: AMI,  Angina, Valve disease, Heart Failure, Peripheral Artery Disease, Cardiac Arrhythmia, Pacemakers, and the ICD.   Other Education:  -Group or individual verbal, written, or video instructions that support the educational goals of the cardiac rehab program.   Holiday Eating Survival Tips:  -Group instruction provided by PowerPoint slides, verbal discussion, and written materials to support subject matter. The instructor gives patients tips, tricks, and techniques to help them not only survive but enjoy the holidays despite the onslaught of food that accompanies the holidays.   Knowledge Questionnaire Score:  Knowledge Questionnaire Score - 02/07/21 1306       Knowledge Questionnaire Score   Pre Score 19/24             Core Components/Risk Factors/Patient Goals at Admission:  Personal Goals and Risk Factors at Admission - 02/07/21 1113       Core Components/Risk Factors/Patient Goals on Admission    Weight Management Yes;Weight Maintenance    Intervention Weight Management: Develop a combined nutrition and exercise program designed to reach desired caloric intake, while maintaining appropriate intake of nutrient and fiber, sodium and fats, and appropriate energy expenditure required for the weight goal.;Weight Management: Provide education and appropriate resources to help participant work on and attain dietary goals.    Expected Outcomes Weight Maintenance: Understanding of the daily nutrition guidelines, which includes 25-35% calories from fat, 7% or less cal from saturated fats, less than 200mg  cholesterol, less than 1.5gm of sodium, & 5 or more servings of fruits and vegetables daily;Long Term: Adherence to nutrition and physical activity/exercise program aimed toward attainment of established weight goal    Hypertension Yes    Intervention Provide education on lifestyle modifcations including regular physical activity/exercise, weight management, moderate sodium restriction and  increased consumption of fresh fruit, vegetables, and low fat dairy, alcohol moderation, and smoking cessation.;Monitor prescription use compliance.    Expected Outcomes Short Term: Continued assessment and intervention until BP is < 140/73mm HG in hypertensive participants. < 130/23mm HG in hypertensive participants with diabetes, heart failure or chronic kidney disease.;Long Term: Maintenance of blood pressure at goal levels.    Lipids Yes    Intervention Provide education and support for participant on nutrition & aerobic/resistive exercise along with  prescribed medications to achieve LDL 70mg , HDL >40mg .    Expected Outcomes Short Term: Participant states understanding of desired cholesterol values and is compliant with medications prescribed. Participant is following exercise prescription and nutrition guidelines.;Long Term: Cholesterol controlled with medications as prescribed, with individualized exercise RX and with personalized nutrition plan. Value goals: LDL < 70mg , HDL > 40 mg.    Personal Goal Other Yes    Personal Goal Breath better. Get nutrition infomration regarding heart healthy eating.    Intervention Provide education regarding heart healthy eating, lifestyle modification, dining out, and reading food labels. Provide education reagarding aerobic exercise to help improve cardiorespiratory fitness.    Expected Outcomes Participant knows heart healthy eating habits. Participant is following exercise prescription and nutrition guidelines.             Core Components/Risk Factors/Patient Goals Review:   Goals and Risk Factor Review     Row Name 02/13/21 1431             Core Components/Risk Factors/Patient Goals Review   Personal Goals Review Weight Management/Obesity;Hypertension;Lipids;Stress       Review Tony Shaffer started exercise at cardiac rehab on 02/13/21. Tony Shaffer did well with exercise. Resting and exertinal bP's were elevated.       Expected Outcomes Tony Shaffer will  continue to participate in phase 2 cardiac rehab for exercise, nutrition and lifestyle modifications                Core Components/Risk Factors/Patient Goals at Discharge (Final Review):   Goals and Risk Factor Review - 02/13/21 1431       Core Components/Risk Factors/Patient Goals Review   Personal Goals Review Weight Management/Obesity;Hypertension;Lipids;Stress    Review Tony Shaffer started exercise at cardiac rehab on 02/13/21. Tony Shaffer did well with exercise. Resting and exertinal bP's were elevated.    Expected Outcomes Tony Shaffer will continue to participate in phase 2 cardiac rehab for exercise, nutrition and lifestyle modifications             ITP Comments:  ITP Comments     Row Name 02/07/21 1020 02/13/21 1422         ITP Comments Medical Director- Dr. Fransico Him, MD 30 Day ITP Review. Tony Shaffer started exercise at cardiac rehab on 02/13/21 nd did well with exercise. Exertional bP's were elevated. Dr Tony Shaffer was notified via message in EPIC               Comments: See ITP Comments

## 2021-02-13 NOTE — Progress Notes (Signed)
Daily Session Note  Patient Details  Name: Tony Shaffer MRN: [id number] Date of Birth: 09-May-1950 Referring Provider:   Flowsheet Row CARDIAC REHAB PHASE II ORIENTATION from 02/07/2021 in [place]  Referring Provider Tony Hector, MD       Encounter Date: 02/13/2021  Check In:  Session Check In - 02/13/21 1043       Check-In   Supervising physician immediately available to respond to emergencies [facility] immediately available    Physician(s) Tony. Broadus Shaffer    Location [facility]    Staff Present Tony Carol, MS, ACSM CEP, Exercise Physiologist;Tony Lilyan Punt, MS, ACSM-CEP, CCRP, Exercise Physiologist;Tony Shaffer BS, ACSM EP-C, Exercise Physiologist;Tony Inman, RN, BSN    Virtual Visit No    Medication changes reported     No    Fall or balance concerns reported    No    Tobacco Cessation No Change    Warm-up and Cool-down Performed as group-led Higher education careers adviser Performed Yes    VAD Patient? No    PAD/SET Patient? No      Pain Assessment   Currently in Pain? No/denies    Pain Score 0-No pain    Multiple Pain Sites No             Capillary Blood Glucose: No results found for this or any previous visit (from the past 24 hour(s)).   Exercise Prescription Changes - 02/13/21 1030       Response to Exercise   Blood Pressure (Admit) 138/62    Blood Pressure (Exercise) 178/82    Blood Pressure (Exit) 142/70    Heart Rate (Admit) 99 bpm    Heart Rate (Exercise) 133 bpm    Heart Rate (Exit) 100 bpm    Rating of Perceived Exertion (Exercise) 12    Symptoms None    Comments Elevated systolic blood pressure with exercise. Moved from bike to track.    Duration Continue with 30 min of aerobic exercise without signs/symptoms of physical distress.    Intensity THRR unchanged      Progression   Progression Continue to progress workloads to maintain intensity without signs/symptoms of  physical distress.    Average METs 2.4      Resistance Training   Training Prescription Yes    Weight 4 lbs    Reps 10-15    Time 10 Minutes      Interval Training   Interval Training No      Bike   Level 1    Minutes 9    METs 1.8      NuStep   Level 3    SPM 85    Minutes 15    METs 2.4      Track   Laps 9    Minutes 8    METs 2.96             Social History   Tobacco Use  Smoking Status Never  Smokeless Tobacco Never    Goals Met:  Exercise tolerated well No report of concerns or symptoms today Strength training completed today  Goals Unmet:  BP  Comments: Tony Shaffer  started cardiac rehab today.  Pt tolerated light exercise without difficulty. Exertional blood pressures were elevated today. Mr Tony Shaffer went above his target heart rate today max beat 140., telemetry-Sinus Rhythm, Bundle, branch block asymptomatic.  Medication list reconciled. Pt denies barriers to medicaiton compliance.  PSYCHOSOCIAL ASSESSMENT:  PHQ-0. Pt exhibits positive  coping skills, hopeful outlook with supportive family. No psychosocial needs identified at this time, no psychosocial interventions necessary.    Pt enjoys walking, jogging and martial arts.   Pt oriented to exercise equipment and routine.    Understanding verbalized. Will forward today's BP's to Tony Shaffer for review.Tony Pall, RN,BSN 02/13/2021 12:14 PM    Tony. Fransico Shaffer is Medical Director for Cardiac Rehab at [facility].

## 2021-02-15 ENCOUNTER — Other Ambulatory Visit: Payer: Self-pay | Admitting: *Deleted

## 2021-02-15 ENCOUNTER — Other Ambulatory Visit: Payer: Self-pay

## 2021-02-15 ENCOUNTER — Telehealth (HOSPITAL_COMMUNITY): Payer: Self-pay | Admitting: *Deleted

## 2021-02-15 ENCOUNTER — Encounter (HOSPITAL_COMMUNITY)
Admission: RE | Admit: 2021-02-15 | Discharge: 2021-02-15 | Disposition: A | Discharge status: Home / Self Care | Payer: Medicare HMO | Source: Ambulatory Visit | Attending: Cardiovascular Disease | Admitting: Cardiovascular Disease

## 2021-02-15 DIAGNOSIS — Z951 Presence of aortocoronary bypass graft: Secondary | ICD-10-CM

## 2021-02-15 MED ORDER — METOPROLOL TARTRATE 50 MG PO TABS: 75.0000 mg | ORAL_TABLET | Freq: Two times a day (BID) | ORAL | 3 refills | Status: DC

## 2021-02-15 NOTE — Telephone Encounter (Signed)
-----   Message from Josue Hector, MD sent at 02/13/2021  2:32 PM EST ----- Regarding: RE: Elevated resting and exertion blood pressure elevations at phase 2 cardiac rehab Would seem reasonable will increase to 75 mg bid  ----- Message ----- From: Magda Kiel, RN Sent: 02/13/2021   2:01 PM EST To: Josue Hector, MD, Jacinta Shoe, CMA Subject: Elevated resting and exertion blood pressure#  Good afternoon Dr Johnsie Cancel,  Mr Desilets started exercise at cardiac rehab today. Mr Bromell had some resting and exertional BP elevations today that I want you to be aware of.  Vital signs are as follows  Resting entry heart rate 99 Blood pressure 138/62  Telemetry rhythm Sinus Rhythm BBB Heart rate 120 Blood pressure 172/76 Nustep  level 3.0 Recheck BP 162/70 Heart rate 133 Blood pressure 178/82 on scifit  bike patient switched to the walking track Heart rate 131 Blood pressure 152/70 on the walking track.  Mr Summerson heart rate went up to 141 Sinus tach using weights which is over his target of 120  Exit resting heart rate 100 Blood pressure 156/70 with a recheck exit BP of 142/70.   Would Mr Peel benefit from an increase of his beta blocker?  Thanks for your input! Sincerely Barnet Pall RN Cardiac rehab

## 2021-02-15 NOTE — Telephone Encounter (Signed)
Pt aware of recommendations and agrees new script sent to Pharmacy for Metoprolol 75 mg twice daily

## 2021-02-17 ENCOUNTER — Encounter (HOSPITAL_COMMUNITY)
Admission: RE | Admit: 2021-02-17 | Discharge: 2021-02-17 | Disposition: A | Discharge status: Home / Self Care | Payer: Medicare HMO | Source: Ambulatory Visit | Attending: Cardiovascular Disease | Admitting: Cardiovascular Disease

## 2021-02-17 ENCOUNTER — Other Ambulatory Visit: Payer: Self-pay

## 2021-02-17 DIAGNOSIS — Z951 Presence of aortocoronary bypass graft: Secondary | ICD-10-CM | POA: Diagnosis present

## 2021-02-20 ENCOUNTER — Encounter (HOSPITAL_COMMUNITY)
Admission: RE | Admit: 2021-02-20 | Discharge: 2021-02-20 | Disposition: A | Discharge status: Home / Self Care | Payer: Medicare HMO | Source: Ambulatory Visit | Attending: Cardiovascular Disease | Admitting: Cardiovascular Disease

## 2021-02-20 ENCOUNTER — Other Ambulatory Visit: Payer: Self-pay

## 2021-02-20 DIAGNOSIS — Z951 Presence of aortocoronary bypass graft: Secondary | ICD-10-CM | POA: Diagnosis not present

## 2021-02-22 ENCOUNTER — Other Ambulatory Visit: Payer: Self-pay

## 2021-02-22 ENCOUNTER — Encounter (HOSPITAL_COMMUNITY)
Admission: RE | Admit: 2021-02-22 | Discharge: 2021-02-22 | Disposition: A | Discharge status: Home / Self Care | Payer: Medicare HMO | Source: Ambulatory Visit | Attending: Cardiovascular Disease | Admitting: Cardiovascular Disease

## 2021-02-22 DIAGNOSIS — Z951 Presence of aortocoronary bypass graft: Secondary | ICD-10-CM

## 2021-02-24 ENCOUNTER — Other Ambulatory Visit: Payer: Self-pay

## 2021-02-24 ENCOUNTER — Encounter (HOSPITAL_COMMUNITY)
Admission: RE | Admit: 2021-02-24 | Discharge: 2021-02-24 | Disposition: A | Discharge status: Home / Self Care | Payer: Medicare HMO | Source: Ambulatory Visit | Attending: Cardiovascular Disease | Admitting: Cardiovascular Disease

## 2021-02-24 DIAGNOSIS — Z951 Presence of aortocoronary bypass graft: Secondary | ICD-10-CM | POA: Diagnosis not present

## 2021-02-27 ENCOUNTER — Encounter (HOSPITAL_COMMUNITY)
Admission: RE | Admit: 2021-02-27 | Discharge: 2021-02-27 | Disposition: A | Discharge status: Home / Self Care | Payer: Medicare HMO | Source: Ambulatory Visit | Attending: Cardiovascular Disease | Admitting: Cardiovascular Disease

## 2021-02-27 ENCOUNTER — Other Ambulatory Visit: Payer: Self-pay

## 2021-02-27 DIAGNOSIS — Z951 Presence of aortocoronary bypass graft: Secondary | ICD-10-CM | POA: Diagnosis not present

## 2021-03-01 ENCOUNTER — Encounter (HOSPITAL_COMMUNITY)
Admission: RE | Admit: 2021-03-01 | Discharge: 2021-03-01 | Disposition: A | Discharge status: Home / Self Care | Payer: Medicare HMO | Source: Ambulatory Visit | Attending: Cardiovascular Disease | Admitting: Cardiovascular Disease

## 2021-03-01 ENCOUNTER — Other Ambulatory Visit: Payer: Self-pay

## 2021-03-01 DIAGNOSIS — Z951 Presence of aortocoronary bypass graft: Secondary | ICD-10-CM | POA: Diagnosis not present

## 2021-03-03 ENCOUNTER — Other Ambulatory Visit: Payer: Self-pay

## 2021-03-03 ENCOUNTER — Encounter (HOSPITAL_COMMUNITY)
Admission: RE | Admit: 2021-03-03 | Discharge: 2021-03-03 | Disposition: A | Discharge status: Home / Self Care | Payer: Medicare HMO | Source: Ambulatory Visit | Attending: Cardiovascular Disease | Admitting: Cardiovascular Disease

## 2021-03-03 DIAGNOSIS — Z951 Presence of aortocoronary bypass graft: Secondary | ICD-10-CM

## 2021-03-03 NOTE — Progress Notes (Signed)
Reviewed home exercise guidelines with patient including endpoints, temperature precautions, target heart rate and rate of perceived exertion. Patient plans to walk as his mode of home exercise. Patient voices understanding of instructions given.  Sol Passer, MS, ACSM CEP 03/03/2021 1101

## 2021-03-06 ENCOUNTER — Encounter (HOSPITAL_COMMUNITY)
Admission: RE | Admit: 2021-03-06 | Discharge: 2021-03-06 | Disposition: A | Discharge status: Home / Self Care | Payer: Medicare HMO | Source: Ambulatory Visit | Attending: Cardiovascular Disease | Admitting: Cardiovascular Disease

## 2021-03-06 ENCOUNTER — Other Ambulatory Visit: Payer: Self-pay

## 2021-03-06 DIAGNOSIS — Z951 Presence of aortocoronary bypass graft: Secondary | ICD-10-CM | POA: Diagnosis not present

## 2021-03-07 NOTE — Progress Notes (Signed)
Cardiac Individual Treatment Plan  Patient Details  Name: Tony Shaffer MRN: [id number] Date of Birth: [birth year] Referring Provider:   Flowsheet Row CARDIAC REHAB PHASE II ORIENTATION from 02/07/2021 in [place]  Referring Provider Josue Hector, MD       Initial Encounter Date:  [place] from 02/07/2021 in [place]  Date 02/07/21       Visit Diagnosis: S/P CABG x 4  Patient's Home Medications on Admission:  Current Outpatient Medications:    aspirin EC 81 MG tablet, Take 81 mg by mouth daily. Swallow whole., Disp: , Rfl:    cetirizine (ZYRTEC) 10 MG tablet, Take 10 mg by mouth daily., Disp: , Rfl:    Cholecalciferol (VITAMIN D3) 50 MCG (2000 UT) TABS, Take 2,000 Units by mouth daily., Disp: , Rfl:    clopidogrel (PLAVIX) 75 MG tablet, Take 1 tablet (75 mg total) by mouth daily., Disp: 90 tablet, Rfl: 1   finasteride (PROSCAR) 5 MG tablet, Take 5 mg by mouth daily., Disp: , Rfl:    hydrALAZINE (APRESOLINE) 50 MG tablet, Take 50 mg by mouth 2 (two) times daily., Disp: , Rfl:    metoprolol tartrate (LOPRESSOR) 50 MG tablet, Take 1.5 tablets (75 mg total) by mouth 2 (two) times daily., Disp: 270 tablet, Rfl: 3   neomycin-bacitracin-polymyxin (NEOSPORIN) ointment, Apply 1 application topically daily as needed (rash). (Patient not taking: Reported on 02/07/2021), Disp: , Rfl:    rosuvastatin (CRESTOR) 20 MG tablet, Take 1 tablet (20 mg total) by mouth daily., Disp: 30 tablet, Rfl: 5  Past Medical History: Past Medical History:  Diagnosis Date   Anticoagulated by anticoagulation treatment    plavix--- managed by cardiology   Benign prostatic hyperplasia with urinary retention    urologist-- dr Junious Silk;   s/p prostatectomy in  08/ 2014 for very enlarged prostate   Coronary artery disease    cardiologist--- dr Johnsie Cancel---  hx nstemi 08-24-2020, s/p cabg x4 08-26-2020    Foley catheter in place    GERD (gastroesophageal reflux disease)    watches diet   History of non-ST elevation myocardial infarction (NSTEMI) 08/24/2020   Hyperlipidemia    Hypertension    RBBB (right bundle branch block with left anterior fascicular block)    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   LIMA to LAD, SVG to PDA, SVG to OM and Diagonal   Seasonal allergies     Tobacco Use: Social History   Tobacco Use  Smoking Status Never  Smokeless Tobacco Never    Labs: Recent Review Flowsheet Data     Labs for ITP Cardiac and Pulmonary Rehab Latest Ref Rng & Units 08/26/2020 08/26/2020 08/26/2020 11/25/2020 12/30/2020   Cholestrol 100 - 199 mg/dL - - - - 139   LDLCALC 0 - 99 mg/dL - - - - 72   HDL >39 mg/dL - - - - 50   Trlycerides 0 - 149 mg/dL - - - - 87   Hemoglobin A1c 4.8 - 5.6 % - - - - -   PHART 7.350 - 7.450 7.358 7.332(L) 7.344(L) - -   PCO2ART 32.0 - 48.0 mmHg 33.2 43.2 41.6 - -   HCO3 20.0 - 28.0 mmol/L 19.1(L) 22.7 22.4 - -   TCO2 22 - 32 mmol/L 20(L) 24 24 20(L) -   ACIDBASEDEF 0.0 - 2.0 mmol/L 6.0(H) 3.0(H) 3.0(H) - -   O2SAT % 99.0 98.0 94.0 - -  Capillary Blood Glucose: Lab Results  Component Value Date   GLUCAP 120 (H) 08/29/2020   GLUCAP 103 (H) 08/29/2020   GLUCAP 93 08/29/2020   GLUCAP 106 (H) 08/29/2020   GLUCAP 111 (H) 08/28/2020     Exercise Target Goals: Exercise Program Goal: Individual exercise prescription set using results from initial 6 min walk test and THRR while considering  patients activity barriers and safety.   Exercise Prescription Goal: Initial exercise prescription builds to 30-45 minutes a day of aerobic activity, 2-3 days per week.  Home exercise guidelines will be given to patient during program as part of exercise prescription that the participant will acknowledge.  Activity Barriers & Risk Stratification:  Activity Barriers & Cardiac Risk Stratification - 02/07/21 1115       Activity Barriers & Cardiac Risk Stratification    Activity Barriers History of Falls    Cardiac Risk Stratification High             6 Minute Walk:  6 Minute Walk     Row Name 02/07/21 1143         6 Minute Walk   Phase Initial     Distance 1857 feet     Walk Time 6 minutes     # of Rest Breaks 0     MPH 3.52     METS 4.48     RPE 12     Perceived Dyspnea  1     VO2 Peak 15.67     Symptoms Yes (comment)     Comments Mild shortness of breath.     Resting HR 78 bpm     Resting BP 138/64     Resting Oxygen Saturation  99 %     Exercise Oxygen Saturation  during 6 min walk 97 %     Max Ex. HR 116 bpm     Max Ex. BP 158/80     2 Minute Post BP 160/70              Oxygen Initial Assessment:   Oxygen Re-Evaluation:   Oxygen Discharge (Final Oxygen Re-Evaluation):   Initial Exercise Prescription:  Initial Exercise Prescription - 02/07/21 1300       Date of Initial Exercise RX and Referring Provider   Date 02/07/21    Referring Provider Josue Hector, MD    Expected Discharge Date 04/07/21      Bike   Level 2    Minutes 15    METs 3.2      NuStep   Level 3    SPM 85    Minutes 15    METs 3      Prescription Details   Frequency (times per week) 3    Duration Progress to 30 minutes of continuous aerobic without signs/symptoms of physical distress      Intensity   THRR 40-80% of Max Heartrate 60-120    Ratings of Perceived Exertion 11-13    Perceived Dyspnea 0-4      Progression   Progression Continue to progress workloads to maintain intensity without signs/symptoms of physical distress.      Resistance Training   Training Prescription Yes    Weight 4 lbs    Reps 10-15             Perform Capillary Blood Glucose checks as needed.  Exercise Prescription Changes:   Exercise Prescription Changes     Row Name 02/13/21 1030 02/27/21 1031  Response to Exercise   Blood Pressure (Admit) 138/62 160/80      Blood Pressure (Exercise) 178/82 164/88      Blood Pressure  (Exit) 142/70 142/68      Heart Rate (Admit) 99 bpm 67 bpm      Heart Rate (Exercise) 133 bpm 115 bpm      Heart Rate (Exit) 100 bpm 74 bpm      Rating of Perceived Exertion (Exercise) 12 11      Symptoms None None      Comments Elevated systolic blood pressure with exercise. Moved from bike to track. --      Duration Continue with 30 min of aerobic exercise without signs/symptoms of physical distress. Continue with 30 min of aerobic exercise without signs/symptoms of physical distress.      Intensity THRR unchanged THRR unchanged        Progression   Progression Continue to progress workloads to maintain intensity without signs/symptoms of physical distress. Continue to progress workloads to maintain intensity without signs/symptoms of physical distress.      Average METs 2.4 3.7        Resistance Training   Training Prescription Yes Yes      Weight 4 lbs 4 lbs      Reps 10-15 10-15      Time 10 Minutes 10 Minutes        Interval Training   Interval Training No No        Bike   Level 1 2      Minutes 9 15      METs 1.8 3.7        NuStep   Level 3 3      SPM 85 85      Minutes 15 15      METs 2.4 3.7        Track   Laps 9 --      Minutes 8 --      METs 2.96 --               Exercise Comments:   Exercise Comments     Row Name 02/13/21 1159 02/27/21 1112 03/03/21 1101       Exercise Comments Patient tolerated first session well. Systolic BP elevated at rest and with exercise. Moved from stationary bike to track due to elevated BP. Continue to monitor. Reviewed METs and goals with patient. Reviewed home exercise guidelines and goals with patient.              Exercise Goals and Review:   Exercise Goals     Row Name 02/07/21 1022             Exercise Goals   Increase Physical Activity Yes       Intervention Provide advice, education, support and counseling about physical activity/exercise needs.;Develop an individualized exercise prescription for  aerobic and resistive training based on initial evaluation findings, risk stratification, comorbidities and participant's personal goals.       Expected Outcomes Short Term: Attend rehab on a regular basis to increase amount of physical activity.;Long Term: Exercising regularly at least 3-5 days a week.;Long Term: Add in home exercise to make exercise part of routine and to increase amount of physical activity.       Increase Strength and Stamina Yes       Intervention Provide advice, education, support and counseling about physical activity/exercise needs.;Develop an individualized exercise prescription for aerobic and resistive training based on initial evaluation  findings, risk stratification, comorbidities and participant's personal goals.       Expected Outcomes Short Term: Increase workloads from initial exercise prescription for resistance, speed, and METs.;Short Term: Perform resistance training exercises routinely during rehab and add in resistance training at home;Long Term: Improve cardiorespiratory fitness, muscular endurance and strength as measured by increased METs and functional capacity (6MWT)       Able to understand and use rate of perceived exertion (RPE) scale Yes       Intervention Provide education and explanation on how to use RPE scale       Expected Outcomes Short Term: Able to use RPE daily in rehab to express subjective intensity level;Long Term:  Able to use RPE to guide intensity level when exercising independently       Knowledge and understanding of Target Heart Rate Range (THRR) Yes       Intervention Provide education and explanation of THRR including how the numbers were predicted and where they are located for reference       Expected Outcomes Short Term: Able to state/look up THRR;Long Term: Able to use THRR to govern intensity when exercising independently;Short Term: Able to use daily as guideline for intensity in rehab       Able to check pulse independently Yes        Intervention Provide education and demonstration on how to check pulse in carotid and radial arteries.;Review the importance of being able to check your own pulse for safety during independent exercise       Expected Outcomes Short Term: Able to explain why pulse checking is important during independent exercise;Long Term: Able to check pulse independently and accurately       Understanding of Exercise Prescription Yes       Intervention Provide education, explanation, and written materials on patient's individual exercise prescription       Expected Outcomes Short Term: Able to explain program exercise prescription;Long Term: Able to explain home exercise prescription to exercise independently                Exercise Goals Re-Evaluation :  Exercise Goals Re-Evaluation     [place] Name 02/13/21 1159 02/27/21 1112 03/03/21 1101         Exercise Goal Re-Evaluation   Exercise Goals Review Increase Physical Activity;Able to understand and use rate of perceived exertion (RPE) scale Increase Physical Activity;Able to understand and use rate of perceived exertion (RPE) scale Increase Physical Activity;Able to understand and use rate of perceived exertion (RPE) scale;Increase Strength and Stamina;Able to check pulse independently;Knowledge and understanding of Target Heart Rate Range (THRR);Understanding of Exercise Prescription     Comments Patient able to understand and use RPE scale appropriately. Systolic BP elevated at rest and with exercise. Patient is progressing well with exercise. Patient's goal is to get back to walking/jogging and to build muscle. Increased hand weights today. Reviewed exercise prescription with patient. Patient plans to walk at least 30 minutes, 2 days/week as his mode of home exercise. Patient doesn't have hand weights at home but can use household items for his resistance training. Instructed patient on how to count his pulse.     Expected Outcomes Progress workloads as  tolerated keeping blood pressure within normal limits. Continue to increase workloads as tolerated. Patient will add 30 minutes of walking at home 1-2 days/week in addition to exercise at cardiac rehab.              Discharge Exercise Prescription (Final Exercise Prescription  Changes):  Exercise Prescription Changes - 02/27/21 1031       Response to Exercise   Blood Pressure (Admit) 160/80    Blood Pressure (Exercise) 164/88    Blood Pressure (Exit) 142/68    Heart Rate (Admit) 67 bpm    Heart Rate (Exercise) 115 bpm    Heart Rate (Exit) 74 bpm    Rating of Perceived Exertion (Exercise) 11    Symptoms None    Duration Continue with 30 min of aerobic exercise without signs/symptoms of physical distress.    Intensity THRR unchanged      Progression   Progression Continue to progress workloads to maintain intensity without signs/symptoms of physical distress.    Average METs 3.7      Resistance Training   Training Prescription Yes    Weight 4 lbs    Reps 10-15    Time 10 Minutes      Interval Training   Interval Training No      Bike   Level 2    Minutes 15    METs 3.7      NuStep   Level 3    SPM 85    Minutes 15    METs 3.7             Nutrition:  Target Goals: Understanding of nutrition guidelines, daily intake of sodium 1500mg , cholesterol 200mg , calories 30% from fat and 7% or less from saturated fats, daily to have 5 or more servings of fruits and vegetables.  Biometrics:  Pre Biometrics - 02/07/21 1020       Pre Biometrics   Waist Circumference 34.5 inches    Hip Circumference 38.5 inches    Waist to Hip Ratio 0.9 %    Triceps Skinfold 14 mm    % Body Fat 23.5 %    Grip Strength 42 kg    Flexibility 12.5 in    Single Leg Stand 30 seconds              Nutrition Therapy Plan and Nutrition Goals:   Nutrition Assessments:  MEDIFICTS Score Key: ?70 Need to make dietary changes  40-70 Heart Healthy Diet ? 40 Therapeutic Level  Cholesterol Diet    Picture Your Plate Scores: <45 Unhealthy dietary pattern with much room for improvement. 41-50 Dietary pattern unlikely to meet recommendations for good health and room for improvement. 51-60 More healthful dietary pattern, with some room for improvement.  >60 Healthy dietary pattern, although there may be some specific behaviors that could be improved.    Nutrition Goals Re-Evaluation:   Nutrition Goals Re-Evaluation:   Nutrition Goals Discharge (Final Nutrition Goals Re-Evaluation):   Psychosocial: Target Goals: Acknowledge presence or absence of significant depression and/or stress, maximize coping skills, provide positive support system. Participant is able to verbalize types and ability to use techniques and skills needed for reducing stress and depression.  Initial Review & Psychosocial Screening:  Initial Psych Review & Screening - 02/07/21 1126       Initial Review   Current issues with Current Stress Concerns    Source of Stress Concerns Financial      Family Dynamics   Good Support System? Yes   Johnathin lives with his brother whom he has for support along with his church family     Barriers   Psychosocial barriers to participate in program The patient should benefit from training in stress management and relaxation.      Screening Interventions   Interventions Encouraged to exercise;Provide  feedback about the scores to participant    Expected Outcomes Long Term Goal: Stressors or current issues are controlled or eliminated.             Quality of Life Scores:  Quality of Life - 02/07/21 1306       Quality of Life   Select Quality of Life      Quality of Life Scores   Health/Function Pre 29.1 %    Socioeconomic Pre 28.33 %    Psych/Spiritual Pre 30 %    Family Pre 30 %    GLOBAL Pre 29.27 %            Scores of 19 and below usually indicate a poorer quality of life in these areas.  A difference of  2-3 points is a  clinically meaningful difference.  A difference of 2-3 points in the total score of the Quality of Life Index has been associated with significant improvement in overall quality of life, self-image, physical symptoms, and general health in studies assessing change in quality of life.  PHQ-9: Recent Review Flowsheet Data     Depression screen [facility] 2/9 02/07/2021   Decreased Interest 0   Down, Depressed, Hopeless 0   PHQ - 2 Score 0      Interpretation of Total Score  Total Score Depression Severity:  1-4 = Minimal depression, 5-9 = Mild depression, 10-14 = Moderate depression, 15-19 = Moderately severe depression, 20-27 = Severe depression   Psychosocial Evaluation and Intervention:   Psychosocial Re-Evaluation:  Psychosocial Re-Evaluation     [place] Name 02/13/21 1429 03/06/21 1703           Psychosocial Re-Evaluation   Current issues with Current Stress Concerns Current Stress Concerns      Comments Willam started exercise at cardiac rehab on 02/13/21 Willam has not voiced any increased stress concnerns since participating in phase 2 cardiac rehab.      Expected Outcomes Willam will have decreased or controlled stress upon completion of phase 2 cardiac rehab. Willam will have decreased or controlled stress upon completion of phase 2 cardiac rehab.      Interventions Stress management education;Encouraged to attend Cardiac Rehabilitation for the exercise Stress management education;Encouraged to attend Cardiac Rehabilitation for the exercise      Continue Psychosocial Services  No Follow up required No Follow up required        Initial Review   Source of Stress Concerns Financial Financial      Comments Will continue to montior and offer support as needed Will continue to montior and offer support as needed               Psychosocial Discharge (Final Psychosocial Re-Evaluation):  Psychosocial Re-Evaluation - 03/06/21 1703       Psychosocial Re-Evaluation   Current issues  with Current Stress Concerns    Comments Willam has not voiced any increased stress concnerns since participating in phase 2 cardiac rehab.    Expected Outcomes Willam will have decreased or controlled stress upon completion of phase 2 cardiac rehab.    Interventions Stress management education;Encouraged to attend Cardiac Rehabilitation for the exercise    Continue Psychosocial Services  No Follow up required      Initial Review   Source of Stress Concerns Financial    Comments Will continue to montior and offer support as needed             Vocational Rehabilitation: Provide vocational rehab assistance to qualifying candidates.  Vocational Rehab Evaluation & Intervention:  Vocational Rehab - 02/07/21 1129       Initial Vocational Rehab Evaluation & Intervention   Assessment shows need for Vocational Rehabilitation No   Lan is retired and does not need vocational rehab at this time            Education: Education Goals: Education classes will be provided on a weekly basis, covering required topics. Participant will state understanding/return demonstration of topics presented.  Learning Barriers/Preferences:  Learning Barriers/Preferences - 02/07/21 1128       Learning Barriers/Preferences   Learning Barriers Exercise Concerns   Willam says he sometimes feels dizzy after taking his medication   Learning Preferences Audio;Computer/Internet;Group Instruction;Individual Instruction;Pictoral;Skilled Demonstration;Verbal Instruction;Video;Written Material             Education Topics: Count Your Pulse:  -Group instruction provided by verbal instruction, demonstration, patient participation and written materials to support subject.  Instructors address importance of being able to find your pulse and how to count your pulse when at home without a heart monitor.  Patients get hands on experience counting their pulse with staff help and individually.   Heart Attack,  Angina, and Risk Factor Modification:  -Group instruction provided by verbal instruction, video, and written materials to support subject.  Instructors address signs and symptoms of angina and heart attacks.    Also discuss risk factors for heart disease and how to make changes to improve heart health risk factors.   Functional Fitness:  -Group instruction provided by verbal instruction, demonstration, patient participation, and written materials to support subject.  Instructors address safety measures for doing things around the house.  Discuss how to get up and down off the floor, how to pick things up properly, how to safely get out of a chair without assistance, and balance training.   Meditation and Mindfulness:  -Group instruction provided by verbal instruction, patient participation, and written materials to support subject.  Instructor addresses importance of mindfulness and meditation practice to help reduce stress and improve awareness.  Instructor also leads participants through a meditation exercise.    Stretching for Flexibility and Mobility:  -Group instruction provided by verbal instruction, patient participation, and written materials to support subject.  Instructors lead participants through series of stretches that are designed to increase flexibility thus improving mobility.  These stretches are additional exercise for major muscle groups that are typically performed during regular warm up and cool down.   Hands Only CPR:  -Group verbal, video, and participation provides a basic overview of AHA guidelines for community CPR. Role-play of emergencies allow participants the opportunity to practice calling for help and chest compression technique with discussion of AED use.   Hypertension: -Group verbal and written instruction that provides a basic overview of hypertension including the most recent diagnostic guidelines, risk factor reduction with self-care instructions and  medication management.    Nutrition I class: Heart Healthy Eating:  -Group instruction provided by PowerPoint slides, verbal discussion, and written materials to support subject matter. The instructor gives an explanation and review of the Therapeutic Lifestyle Changes diet recommendations, which includes a discussion on lipid goals, dietary fat, sodium, fiber, plant stanol/sterol esters, sugar, and the components of a well-balanced, healthy diet.   Nutrition II class: Lifestyle Skills:  -Group instruction provided by PowerPoint slides, verbal discussion, and written materials to support subject matter. The instructor gives an explanation and review of label reading, grocery shopping for heart health, heart healthy recipe modifications, and ways to make  healthier choices when eating out.   Diabetes Question & Answer:  -Group instruction provided by PowerPoint slides, verbal discussion, and written materials to support subject matter. The instructor gives an explanation and review of diabetes co-morbidities, pre- and post-prandial blood glucose goals, pre-exercise blood glucose goals, signs, symptoms, and treatment of hypoglycemia and hyperglycemia, and foot care basics.   Diabetes Blitz:  -Group instruction provided by PowerPoint slides, verbal discussion, and written materials to support subject matter. The instructor gives an explanation and review of the physiology behind type 1 and type 2 diabetes, diabetes medications and rational behind using different medications, pre- and post-prandial blood glucose recommendations and Hemoglobin A1c goals, diabetes diet, and exercise including blood glucose guidelines for exercising safely.    Portion Distortion:  -Group instruction provided by PowerPoint slides, verbal discussion, written materials, and food models to support subject matter. The instructor gives an explanation of serving size versus portion size, changes in portions sizes over the last  20 years, and what consists of a serving from each food group.   Stress Management:  -Group instruction provided by verbal instruction, video, and written materials to support subject matter.  Instructors review role of stress in heart disease and how to cope with stress positively.     Exercising on Your Own:  -Group instruction provided by verbal instruction, power point, and written materials to support subject.  Instructors discuss benefits of exercise, components of exercise, frequency and intensity of exercise, and end points for exercise.  Also discuss use of nitroglycerin and activating EMS.  Review options of places to exercise outside of rehab.  Review guidelines for sex with heart disease.   Cardiac Drugs I:  -Group instruction provided by verbal instruction and written materials to support subject.  Instructor reviews cardiac drug classes: antiplatelets, anticoagulants, beta blockers, and statins.  Instructor discusses reasons, side effects, and lifestyle considerations for each drug class.   Cardiac Drugs II:  -Group instruction provided by verbal instruction and written materials to support subject.  Instructor reviews cardiac drug classes: angiotensin converting enzyme inhibitors (ACE-I), angiotensin II receptor blockers (ARBs), nitrates, and calcium channel blockers.  Instructor discusses reasons, side effects, and lifestyle considerations for each drug class.   Anatomy and Physiology of the Circulatory System:  Group verbal and written instruction and models provide basic cardiac anatomy and physiology, with the coronary electrical and arterial systems. Review of: AMI, Angina, Valve disease, Heart Failure, Peripheral Artery Disease, Cardiac Arrhythmia, Pacemakers, and the ICD.   Other Education:  -Group or individual verbal, written, or video instructions that support the educational goals of the cardiac rehab program.   Holiday Eating Survival Tips:  -Group instruction  provided by PowerPoint slides, verbal discussion, and written materials to support subject matter. The instructor gives patients tips, tricks, and techniques to help them not only survive but enjoy the holidays despite the onslaught of food that accompanies the holidays.   Knowledge Questionnaire Score:  Knowledge Questionnaire Score - 02/07/21 1306       Knowledge Questionnaire Score   Pre Score 19/24             Core Components/Risk Factors/Patient Goals at Admission:  Personal Goals and Risk Factors at Admission - 02/07/21 1113       Core Components/Risk Factors/Patient Goals on Admission    Weight Management Yes;Weight Maintenance    Intervention Weight Management: Develop a combined nutrition and exercise program designed to reach desired caloric intake, while maintaining appropriate intake of nutrient and fiber, sodium  and fats, and appropriate energy expenditure required for the weight goal.;Weight Management: Provide education and appropriate resources to help participant work on and attain dietary goals.    Expected Outcomes Weight Maintenance: Understanding of the daily nutrition guidelines, which includes 25-35% calories from fat, 7% or less cal from saturated fats, less than 200mg  cholesterol, less than 1.5gm of sodium, & 5 or more servings of fruits and vegetables daily;Long Term: Adherence to nutrition and physical activity/exercise program aimed toward attainment of established weight goal    Hypertension Yes    Intervention Provide education on lifestyle modifcations including regular physical activity/exercise, weight management, moderate sodium restriction and increased consumption of fresh fruit, vegetables, and low fat dairy, alcohol moderation, and smoking cessation.;Monitor prescription use compliance.    Expected Outcomes Short Term: Continued assessment and intervention until BP is < 140/83mm HG in hypertensive participants. < 130/64mm HG in hypertensive participants  with diabetes, heart failure or chronic kidney disease.;Long Term: Maintenance of blood pressure at goal levels.    Lipids Yes    Intervention Provide education and support for participant on nutrition & aerobic/resistive exercise along with prescribed medications to achieve LDL 70mg , HDL >40mg .    Expected Outcomes Short Term: Participant states understanding of desired cholesterol values and is compliant with medications prescribed. Participant is following exercise prescription and nutrition guidelines.;Long Term: Cholesterol controlled with medications as prescribed, with individualized exercise RX and with personalized nutrition plan. Value goals: LDL < 70mg , HDL > 40 mg.    Personal Goal Other Yes    Personal Goal Breath better. Get nutrition infomration regarding heart healthy eating.    Intervention Provide education regarding heart healthy eating, lifestyle modification, dining out, and reading food labels. Provide education reagarding aerobic exercise to help improve cardiorespiratory fitness.    Expected Outcomes Participant knows heart healthy eating habits. Participant is following exercise prescription and nutrition guidelines.             Core Components/Risk Factors/Patient Goals Review:   Goals and Risk Factor Review     Row Name 02/13/21 1431 03/06/21 1704           Core Components/Risk Factors/Patient Goals Review   Personal Goals Review Weight Management/Obesity;Hypertension;Lipids;Stress Weight Management/Obesity;Hypertension;Lipids;Stress      Review Naser started exercise at cardiac rehab on 02/13/21. Darrio did well with exercise. Resting and exertinal bP's were elevated. Takashi has been doing well with exercise at phase 2 cardiac rehab. Blood pressures have imporved since Dr Johnsie Cancel increased his beta blocker.      Expected Outcomes Lowell will continue to participate in phase 2 cardiac rehab for exercise, nutrition and lifestyle modifications Dionisios will  continue to participate in phase 2 cardiac rehab for exercise, nutrition and lifestyle modifications               Core Components/Risk Factors/Patient Goals at Discharge (Final Review):   Goals and Risk Factor Review - 03/06/21 1704       Core Components/Risk Factors/Patient Goals Review   Personal Goals Review Weight Management/Obesity;Hypertension;Lipids;Stress    Review Kurtiss has been doing well with exercise at phase 2 cardiac rehab. Blood pressures have imporved since Dr Johnsie Cancel increased his beta blocker.    Expected Outcomes Koltyn will continue to participate in phase 2 cardiac rehab for exercise, nutrition and lifestyle modifications             ITP Comments:  ITP Comments     Row Name 02/07/21 1020 02/13/21 1422 03/06/21 1701  ITP Comments Medical Director- Dr. Fransico Him, MD 30 Day ITP Review. Cortlin started exercise at cardiac rehab on 02/13/21 nd did well with exercise. Exertional bP's were elevated. Dr Johnsie Cancel was notified via message in [facility] 30 Day ITP Review. Bearett has good attendance and participation in phase 2 cardiac rehab. Cordarious's BP's have been improved.              Comments: See ITP Comments

## 2021-03-08 ENCOUNTER — Other Ambulatory Visit: Payer: Self-pay

## 2021-03-08 ENCOUNTER — Encounter (HOSPITAL_COMMUNITY)
Admission: RE | Admit: 2021-03-08 | Discharge: 2021-03-08 | Disposition: A | Discharge status: Home / Self Care | Payer: Medicare HMO | Source: Ambulatory Visit | Attending: Cardiovascular Disease | Admitting: Cardiovascular Disease

## 2021-03-08 DIAGNOSIS — Z951 Presence of aortocoronary bypass graft: Secondary | ICD-10-CM

## 2021-03-10 ENCOUNTER — Encounter (HOSPITAL_COMMUNITY)
Admission: RE | Admit: 2021-03-10 | Discharge: 2021-03-10 | Disposition: A | Discharge status: Home / Self Care | Payer: Medicare HMO | Source: Ambulatory Visit | Attending: Cardiovascular Disease | Admitting: Cardiovascular Disease

## 2021-03-10 ENCOUNTER — Other Ambulatory Visit: Payer: Self-pay

## 2021-03-10 DIAGNOSIS — Z951 Presence of aortocoronary bypass graft: Secondary | ICD-10-CM | POA: Diagnosis not present

## 2021-03-15 ENCOUNTER — Other Ambulatory Visit: Payer: Self-pay

## 2021-03-15 ENCOUNTER — Encounter (HOSPITAL_COMMUNITY)
Admission: RE | Admit: 2021-03-15 | Discharge: 2021-03-15 | Disposition: A | Discharge status: Home / Self Care | Payer: Medicare HMO | Source: Ambulatory Visit | Attending: Cardiovascular Disease | Admitting: Cardiovascular Disease

## 2021-03-15 DIAGNOSIS — Z951 Presence of aortocoronary bypass graft: Secondary | ICD-10-CM | POA: Diagnosis not present

## 2021-03-17 ENCOUNTER — Other Ambulatory Visit: Payer: Self-pay

## 2021-03-17 ENCOUNTER — Encounter (HOSPITAL_COMMUNITY)
Admission: RE | Admit: 2021-03-17 | Discharge: 2021-03-17 | Disposition: A | Discharge status: Home / Self Care | Payer: Medicare HMO | Source: Ambulatory Visit | Attending: Cardiovascular Disease | Admitting: Cardiovascular Disease

## 2021-03-17 DIAGNOSIS — Z951 Presence of aortocoronary bypass graft: Secondary | ICD-10-CM

## 2021-03-22 ENCOUNTER — Other Ambulatory Visit: Payer: Self-pay

## 2021-03-22 ENCOUNTER — Encounter (HOSPITAL_COMMUNITY)
Admission: RE | Admit: 2021-03-22 | Discharge: 2021-03-22 | Disposition: A | Discharge status: Home / Self Care | Payer: Medicare HMO | Source: Ambulatory Visit | Attending: Cardiovascular Disease | Admitting: Cardiovascular Disease

## 2021-03-22 DIAGNOSIS — Z951 Presence of aortocoronary bypass graft: Secondary | ICD-10-CM | POA: Diagnosis present

## 2021-03-22 DIAGNOSIS — Z48812 Encounter for surgical aftercare following surgery on the circulatory system: Secondary | ICD-10-CM | POA: Insufficient documentation

## 2021-03-24 ENCOUNTER — Encounter (HOSPITAL_COMMUNITY)
Admission: RE | Admit: 2021-03-24 | Discharge: 2021-03-24 | Disposition: A | Discharge status: Home / Self Care | Payer: Medicare HMO | Source: Ambulatory Visit | Attending: Cardiovascular Disease | Admitting: Cardiovascular Disease

## 2021-03-24 ENCOUNTER — Other Ambulatory Visit: Payer: Self-pay

## 2021-03-24 DIAGNOSIS — Z48812 Encounter for surgical aftercare following surgery on the circulatory system: Secondary | ICD-10-CM | POA: Diagnosis not present

## 2021-03-24 DIAGNOSIS — Z951 Presence of aortocoronary bypass graft: Secondary | ICD-10-CM

## 2021-03-27 ENCOUNTER — Encounter (HOSPITAL_COMMUNITY)
Admission: RE | Admit: 2021-03-27 | Discharge: 2021-03-27 | Disposition: A | Discharge status: Home / Self Care | Payer: Medicare HMO | Source: Ambulatory Visit | Attending: Cardiovascular Disease | Admitting: Cardiovascular Disease

## 2021-03-27 ENCOUNTER — Other Ambulatory Visit: Payer: Self-pay

## 2021-03-27 DIAGNOSIS — Z951 Presence of aortocoronary bypass graft: Secondary | ICD-10-CM

## 2021-03-27 DIAGNOSIS — Z48812 Encounter for surgical aftercare following surgery on the circulatory system: Secondary | ICD-10-CM | POA: Diagnosis not present

## 2021-03-29 ENCOUNTER — Other Ambulatory Visit: Payer: Self-pay

## 2021-03-29 ENCOUNTER — Encounter (HOSPITAL_COMMUNITY)
Admission: RE | Admit: 2021-03-29 | Discharge: 2021-03-29 | Disposition: A | Discharge status: Home / Self Care | Payer: Medicare HMO | Source: Ambulatory Visit | Attending: Cardiovascular Disease | Admitting: Cardiovascular Disease

## 2021-03-29 DIAGNOSIS — Z48812 Encounter for surgical aftercare following surgery on the circulatory system: Secondary | ICD-10-CM | POA: Diagnosis not present

## 2021-03-29 DIAGNOSIS — Z951 Presence of aortocoronary bypass graft: Secondary | ICD-10-CM

## 2021-03-31 ENCOUNTER — Encounter (HOSPITAL_COMMUNITY)
Admission: RE | Admit: 2021-03-31 | Discharge: 2021-03-31 | Disposition: A | Discharge status: Home / Self Care | Payer: Medicare HMO | Source: Ambulatory Visit | Attending: Cardiovascular Disease | Admitting: Cardiovascular Disease

## 2021-03-31 ENCOUNTER — Other Ambulatory Visit: Payer: Self-pay

## 2021-03-31 DIAGNOSIS — Z48812 Encounter for surgical aftercare following surgery on the circulatory system: Secondary | ICD-10-CM | POA: Diagnosis not present

## 2021-03-31 DIAGNOSIS — Z951 Presence of aortocoronary bypass graft: Secondary | ICD-10-CM

## 2021-04-03 ENCOUNTER — Encounter (HOSPITAL_COMMUNITY)
Admission: RE | Admit: 2021-04-03 | Discharge: 2021-04-03 | Disposition: A | Discharge status: Home / Self Care | Payer: Medicare HMO | Source: Ambulatory Visit | Attending: Cardiovascular Disease | Admitting: Cardiovascular Disease

## 2021-04-03 ENCOUNTER — Other Ambulatory Visit: Payer: Self-pay

## 2021-04-03 DIAGNOSIS — Z951 Presence of aortocoronary bypass graft: Secondary | ICD-10-CM

## 2021-04-03 DIAGNOSIS — Z48812 Encounter for surgical aftercare following surgery on the circulatory system: Secondary | ICD-10-CM | POA: Diagnosis not present

## 2021-04-04 NOTE — Progress Notes (Signed)
Cardiac Individual Treatment Plan  Patient Details  Name: Tony Shaffer MRN: [id number] Date of Birth: 1951/03/11 Referring Provider:   Flowsheet Row CARDIAC REHAB PHASE II ORIENTATION from 02/07/2021 in [place]  Referring Provider Josue Hector, MD       Initial Encounter Date:  [place] from 02/07/2021 in [place]  Date 02/07/21       Visit Diagnosis: S/P CABG x 4  Patient's Home Medications on Admission:  Current Outpatient Medications:    aspirin EC 81 MG tablet, Take 81 mg by mouth daily. Swallow whole., Disp: , Rfl:    cetirizine (ZYRTEC) 10 MG tablet, Take 10 mg by mouth daily., Disp: , Rfl:    Cholecalciferol (VITAMIN D3) 50 MCG (2000 UT) TABS, Take 2,000 Units by mouth daily., Disp: , Rfl:    clopidogrel (PLAVIX) 75 MG tablet, Take 1 tablet (75 mg total) by mouth daily., Disp: 90 tablet, Rfl: 1   finasteride (PROSCAR) 5 MG tablet, Take 5 mg by mouth daily., Disp: , Rfl:    hydrALAZINE (APRESOLINE) 50 MG tablet, Take 50 mg by mouth 2 (two) times daily., Disp: , Rfl:    metoprolol tartrate (LOPRESSOR) 50 MG tablet, Take 1.5 tablets (75 mg total) by mouth 2 (two) times daily., Disp: 270 tablet, Rfl: 3   neomycin-bacitracin-polymyxin (NEOSPORIN) ointment, Apply 1 application topically daily as needed (rash). (Patient not taking: Reported on 02/07/2021), Disp: , Rfl:    rosuvastatin (CRESTOR) 20 MG tablet, Take 1 tablet (20 mg total) by mouth daily., Disp: 30 tablet, Rfl: 5  Past Medical History: Past Medical History:  Diagnosis Date   Anticoagulated by anticoagulation treatment    plavix--- managed by cardiology   Benign prostatic hyperplasia with urinary retention    urologist-- dr Junious Silk;   s/p prostatectomy in  08/ 2014 for very enlarged prostate   Coronary artery disease    cardiologist--- dr Johnsie Cancel---  hx nstemi 08-24-2020, s/p cabg x4 08-26-2020    Foley catheter in place    GERD (gastroesophageal reflux disease)    watches diet   History of non-ST elevation myocardial infarction (NSTEMI) 08/24/2020   Hyperlipidemia    Hypertension    RBBB (right bundle branch block with left anterior fascicular block)    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   LIMA to LAD, SVG to PDA, SVG to OM and Diagonal   Seasonal allergies     Tobacco Use: Social History   Tobacco Use  Smoking Status Never  Smokeless Tobacco Never    Labs: Recent Review Flowsheet Data     Labs for ITP Cardiac and Pulmonary Rehab Latest Ref Rng & Units 08/26/2020 08/26/2020 08/26/2020 11/25/2020 12/30/2020   Cholestrol 100 - 199 mg/dL - - - - 139   LDLCALC 0 - 99 mg/dL - - - - 72   HDL >39 mg/dL - - - - 50   Trlycerides 0 - 149 mg/dL - - - - 87   Hemoglobin A1c 4.8 - 5.6 % - - - - -   PHART 7.350 - 7.450 7.358 7.332(L) 7.344(L) - -   PCO2ART 32.0 - 48.0 mmHg 33.2 43.2 41.6 - -   HCO3 20.0 - 28.0 mmol/L 19.1(L) 22.7 22.4 - -   TCO2 22 - 32 mmol/L 20(L) 24 24 20(L) -   ACIDBASEDEF 0.0 - 2.0 mmol/L 6.0(H) 3.0(H) 3.0(H) - -   O2SAT % 99.0 98.0 94.0 - -  Capillary Blood Glucose: Lab Results  Component Value Date   GLUCAP 120 (H) 08/29/2020   GLUCAP 103 (H) 08/29/2020   GLUCAP 93 08/29/2020   GLUCAP 106 (H) 08/29/2020   GLUCAP 111 (H) 08/28/2020     Exercise Target Goals: Exercise Program Goal: Individual exercise prescription set using results from initial 6 min walk test and THRR while considering  patients activity barriers and safety.   Exercise Prescription Goal: Initial exercise prescription builds to 30-45 minutes a day of aerobic activity, 2-3 days per week.  Home exercise guidelines will be given to patient during program as part of exercise prescription that the participant will acknowledge.  Activity Barriers & Risk Stratification:  Activity Barriers & Cardiac Risk Stratification - 02/07/21 1115       Activity Barriers & Cardiac Risk Stratification    Activity Barriers History of Falls    Cardiac Risk Stratification High             6 Minute Walk:  6 Minute Walk     Row Name 02/07/21 1143 03/31/21 1036       6 Minute Walk   Phase Initial Discharge    Distance 1857 feet 1725 feet    Distance % Change -- 7.11 %    Distance Feet Change -- 132 ft    Walk Time 6 minutes 6 minutes    # of Rest Breaks 0 0    MPH 3.52 3.27    METS 4.48 4.16    RPE 12 10    Perceived Dyspnea  1 1    VO2 Peak 15.67 14.56    Symptoms Yes (comment) Yes (comment)    Comments Mild shortness of breath. Mild shortness of breath    Resting HR 78 bpm 71 bpm    Resting BP 138/64 132/68    Resting Oxygen Saturation  99 % 100 %    Exercise Oxygen Saturation  during 6 min walk 97 % 96 %    Max Ex. HR 116 bpm 103 bpm    Max Ex. BP 158/80 168/72    2 Minute Post BP 160/70 124/72             Oxygen Initial Assessment:   Oxygen Re-Evaluation:   Oxygen Discharge (Final Oxygen Re-Evaluation):   Initial Exercise Prescription:  Initial Exercise Prescription - 02/07/21 1300       Date of Initial Exercise RX and Referring Provider   Date 02/07/21    Referring Provider Josue Hector, MD    Expected Discharge Date 04/07/21      Bike   Level 2    Minutes 15    METs 3.2      NuStep   Level 3    SPM 85    Minutes 15    METs 3      Prescription Details   Frequency (times per week) 3    Duration Progress to 30 minutes of continuous aerobic without signs/symptoms of physical distress      Intensity   THRR 40-80% of Max Heartrate 60-120    Ratings of Perceived Exertion 11-13    Perceived Dyspnea 0-4      Progression   Progression Continue to progress workloads to maintain intensity without signs/symptoms of physical distress.      Resistance Training   Training Prescription Yes    Weight 4 lbs    Reps 10-15             Perform Capillary Blood Glucose checks  as needed.  Exercise Prescription Changes:   Exercise  Prescription Changes     Row Name 02/13/21 1030 02/27/21 1031 03/22/21 1035 04/03/21 1024       Response to Exercise   Blood Pressure (Admit) 138/62 160/80 138/72 128/72    Blood Pressure (Exercise) 178/82 164/88 156/82 158/82    Blood Pressure (Exit) 142/70 142/68 118/72 118/62    Heart Rate (Admit) 99 bpm 67 bpm 73 bpm 83 bpm    Heart Rate (Exercise) 133 bpm 115 bpm 116 bpm 119 bpm    Heart Rate (Exit) 100 bpm 74 bpm 79 bpm 92 bpm    Rating of Perceived Exertion (Exercise) 12 11 9 11     Symptoms None None None None    Comments Elevated systolic blood pressure with exercise. Moved from bike to track. -- -- --    Duration Continue with 30 min of aerobic exercise without signs/symptoms of physical distress. Continue with 30 min of aerobic exercise without signs/symptoms of physical distress. Continue with 30 min of aerobic exercise without signs/symptoms of physical distress. Continue with 30 min of aerobic exercise without signs/symptoms of physical distress.    Intensity THRR unchanged THRR unchanged THRR unchanged THRR unchanged      Progression   Progression Continue to progress workloads to maintain intensity without signs/symptoms of physical distress. Continue to progress workloads to maintain intensity without signs/symptoms of physical distress. Continue to progress workloads to maintain intensity without signs/symptoms of physical distress. Continue to progress workloads to maintain intensity without signs/symptoms of physical distress.    Average METs 2.4 3.7 4.7 5.2      Resistance Training   Training Prescription Yes Yes No  Relaxation day, no weights Yes    Weight 4 lbs 4 lbs -- 6 lbs    Reps 10-15 10-15 -- 10-15    Time 10 Minutes 10 Minutes -- 10 Minutes      Interval Training   Interval Training No No No No      Bike   Level 1 2 3.5 4.9    Minutes 9 15 15 15     METs 1.8 3.7 5.2 5.2      NuStep   Level 3 3 3 4     SPM 85 85 85 85    Minutes 15 15 15 15     METs  2.4 3.7 4.3 5.2      Track   Laps 9 -- -- --    Minutes 8 -- -- --    METs 2.96 -- -- --      Home Exercise Plan   Plans to continue exercise at -- -- Home (comment)  Walking Home (comment)  Walking    Frequency -- -- Add 2 additional days to program exercise sessions. Add 2 additional days to program exercise sessions.    Initial Home Exercises Provided -- -- 03/03/21 03/03/21             Exercise Comments:   Exercise Comments     Row Name 02/13/21 1159 02/27/21 1112 03/03/21 1101 03/22/21 1125 03/29/21 1013   Exercise Comments Patient tolerated first session well. Systolic BP elevated at rest and with exercise. Moved from stationary bike to track due to elevated BP. Continue to monitor. Reviewed METs and goals with patient. Reviewed home exercise guidelines and goals with patient. Reviewed METs with patient. Reviewed METs and goals with patient.    [place] Name 04/03/21 1101           Exercise Comments  Reviewed METs with patient.                Exercise Goals and Review:   Exercise Goals     Row Name 02/07/21 1022             Exercise Goals   Increase Physical Activity Yes       Intervention Provide advice, education, support and counseling about physical activity/exercise needs.;Develop an individualized exercise prescription for aerobic and resistive training based on initial evaluation findings, risk stratification, comorbidities and participant's personal goals.       Expected Outcomes Short Term: Attend rehab on a regular basis to increase amount of physical activity.;Long Term: Exercising regularly at least 3-5 days a week.;Long Term: Add in home exercise to make exercise part of routine and to increase amount of physical activity.       Increase Strength and Stamina Yes       Intervention Provide advice, education, support and counseling about physical activity/exercise needs.;Develop an individualized exercise prescription for aerobic and resistive training  based on initial evaluation findings, risk stratification, comorbidities and participant's personal goals.       Expected Outcomes Short Term: Increase workloads from initial exercise prescription for resistance, speed, and METs.;Short Term: Perform resistance training exercises routinely during rehab and add in resistance training at home;Long Term: Improve cardiorespiratory fitness, muscular endurance and strength as measured by increased METs and functional capacity (6MWT)       Able to understand and use rate of perceived exertion (RPE) scale Yes       Intervention Provide education and explanation on how to use RPE scale       Expected Outcomes Short Term: Able to use RPE daily in rehab to express subjective intensity level;Long Term:  Able to use RPE to guide intensity level when exercising independently       Knowledge and understanding of Target Heart Rate Range (THRR) Yes       Intervention Provide education and explanation of THRR including how the numbers were predicted and where they are located for reference       Expected Outcomes Short Term: Able to state/look up THRR;Long Term: Able to use THRR to govern intensity when exercising independently;Short Term: Able to use daily as guideline for intensity in rehab       Able to check pulse independently Yes       Intervention Provide education and demonstration on how to check pulse in carotid and radial arteries.;Review the importance of being able to check your own pulse for safety during independent exercise       Expected Outcomes Short Term: Able to explain why pulse checking is important during independent exercise;Long Term: Able to check pulse independently and accurately       Understanding of Exercise Prescription Yes       Intervention Provide education, explanation, and written materials on patient's individual exercise prescription       Expected Outcomes Short Term: Able to explain program exercise prescription;Long Term: Able to  explain home exercise prescription to exercise independently                Exercise Goals Re-Evaluation :  Exercise Goals Re-Evaluation     Row Name 02/13/21 1159 02/27/21 1112 03/03/21 1101 03/29/21 1013       Exercise Goal Re-Evaluation   Exercise Goals Review Increase Physical Activity;Able to understand and use rate of perceived exertion (RPE) scale Increase Physical Activity;Able to understand and use rate of  perceived exertion (RPE) scale Increase Physical Activity;Able to understand and use rate of perceived exertion (RPE) scale;Increase Strength and Stamina;Able to check pulse independently;Knowledge and understanding of Target Heart Rate Range (THRR);Understanding of Exercise Prescription Increase Physical Activity;Able to understand and use rate of perceived exertion (RPE) scale;Increase Strength and Stamina;Able to check pulse independently;Knowledge and understanding of Target Heart Rate Range (THRR);Understanding of Exercise Prescription    Comments Patient able to understand and use RPE scale appropriately. Systolic BP elevated at rest and with exercise. Patient is progressing well with exercise. Patient's goal is to get back to walking/jogging and to build muscle. Increased hand weights today. Reviewed exercise prescription with patient. Patient plans to walk at least 30 minutes, 2 days/week as his mode of home exercise. Patient doesn't have hand weights at home but can use household items for his resistance training. Instructed patient on how to count his pulse. Patient states he's not consistent with his walking at home because he's had a lot going on but [facility] [facility] Patient states he feels a whole lot better now than when he started the program. Patient reports less shortness of breath, only with heavy exercise.    Expected Outcomes Progress workloads as tolerated keeping blood pressure within normal limits. Continue to increase  workloads as tolerated. Patient will add 30 minutes of walking at home 1-2 days/week in addition to exercise at cardiac rehab. Patient will increase walking at home [facility].             Discharge Exercise Prescription (Final Exercise Prescription Changes):  Exercise Prescription Changes - 04/03/21 1024       Response to Exercise   Blood Pressure (Admit) 128/72    Blood Pressure (Exercise) 158/82    Blood Pressure (Exit) 118/62    Heart Rate (Admit) 83 bpm    Heart Rate (Exercise) 119 bpm    Heart Rate (Exit) 92 bpm    Rating of Perceived Exertion (Exercise) 11    Symptoms None    Duration Continue with 30 min of aerobic exercise without signs/symptoms of physical distress.    Intensity THRR unchanged      Progression   Progression Continue to progress workloads to maintain intensity without signs/symptoms of physical distress.    Average METs 5.2      Resistance Training   Training Prescription Yes    Weight 6 lbs    Reps 10-15    Time 10 Minutes      Interval Training   Interval Training No      Bike   Level 4.9    Minutes 15    METs 5.2      NuStep   Level 4    SPM 85    Minutes 15    METs 5.2      Home Exercise Plan   Plans to continue exercise at Home (comment)   Walking   Frequency Add 2 additional days to program exercise sessions.    Initial Home Exercises Provided 03/03/21             Nutrition:  Target Goals: Understanding of nutrition guidelines, daily intake of sodium 1500mg , cholesterol 200mg , calories 30% from fat and 7% or less from saturated fats, daily to have 5 or more servings of fruits and vegetables.  Biometrics:  Pre Biometrics - 02/07/21 1020       Pre Biometrics   Waist  Circumference 34.5 inches    Hip Circumference 38.5 inches    Waist to Hip Ratio 0.9 %    Triceps Skinfold 14 mm    % Body Fat 23.5 %    Grip Strength 42 kg    Flexibility 12.5 in    Single  Leg Stand 30 seconds             Post Biometrics - 03/31/21 1150        Post  Biometrics   Waist Circumference 34 inches    Hip Circumference 38.5 inches    Waist to Hip Ratio 0.88 %    Triceps Skinfold 17 mm    Grip Strength 47 kg    Flexibility 14 in    Single Leg Stand 30 seconds             Nutrition Therapy Plan and Nutrition Goals:   Nutrition Assessments:  MEDIFICTS Score Key: ?70 Need to make dietary changes  40-70 Heart Healthy Diet ? 40 Therapeutic Level Cholesterol Diet    Picture Your Plate Scores: <19 Unhealthy dietary pattern with much room for improvement. 41-50 Dietary pattern unlikely to meet recommendations for good health and room for improvement. 51-60 More healthful dietary pattern, with some room for improvement.  >60 Healthy dietary pattern, although there may be some specific behaviors that could be improved.    Nutrition Goals Re-Evaluation:   Nutrition Goals Re-Evaluation:   Nutrition Goals Discharge (Final Nutrition Goals Re-Evaluation):   Psychosocial: Target Goals: Acknowledge presence or absence of significant depression and/or stress, maximize coping skills, provide positive support system. Participant is able to verbalize types and ability to use techniques and skills needed for reducing stress and depression.  Initial Review & Psychosocial Screening:  Initial Psych Review & Screening - 02/07/21 1126       Initial Review   Current issues with Current Stress Concerns    Source of Stress Concerns Financial      Family Dynamics   Good Support System? Yes   Seanpatrick lives with his brother whom he has for support along with his church family     Barriers   Psychosocial barriers to participate in program The patient should benefit from training in stress management and relaxation.      Screening Interventions   Interventions Encouraged to exercise;Provide feedback about the scores to participant    Expected Outcomes Long  Term Goal: Stressors or current issues are controlled or eliminated.             Quality of Life Scores:  Quality of Life - 03/31/21 0802       Quality of Life   Select Quality of Life      Quality of Life Scores   Health/Function Pre 29.1 %    Health/Function Post 25.82 %    Health/Function % Change -11.27 %    Socioeconomic Pre 28.33 %    Socioeconomic Post 27.64 %    Socioeconomic % Change  -2.44 %    Psych/Spiritual Pre 30 %    Psych/Spiritual Post 30 %    Psych/Spiritual % Change 0 %    Family Pre 30 %    Family Post 25.5 %    Family % Change -15 %    GLOBAL Pre 29.27 %    GLOBAL Post 27.15 %    GLOBAL % Change -7.24 %            Scores of 19 and below usually indicate a poorer quality of  life in these areas.  A difference of  2-3 points is a clinically meaningful difference.  A difference of 2-3 points in the total score of the Quality of Life Index has been associated with significant improvement in overall quality of life, self-image, physical symptoms, and general health in studies assessing change in quality of life.  PHQ-9: Recent Review Flowsheet Data     Depression screen [facility] 2/9 02/07/2021   Decreased Interest 0   Down, Depressed, Hopeless 0   PHQ - 2 Score 0      Interpretation of Total Score  Total Score Depression Severity:  1-4 = Minimal depression, 5-9 = Mild depression, 10-14 = Moderate depression, 15-19 = Moderately severe depression, 20-27 = Severe depression   Psychosocial Evaluation and Intervention:   Psychosocial Re-Evaluation:  Psychosocial Re-Evaluation     [place] Name 02/13/21 1429 03/06/21 1703 04/03/21 1704         Psychosocial Re-Evaluation   Current issues with Current Stress Concerns Current Stress Concerns Current Stress Concerns     Comments Willam started exercise at cardiac rehab on 02/13/21 Willam has not voiced any increased stress concnerns since participating in phase 2 cardiac rehab. Willam has not voiced any  increased stress concnerns since participating in phase 2 cardiac rehab. [facility] will complete CR on 04/07/21     Expected Outcomes Willam will have decreased or controlled stress upon completion of phase 2 cardiac rehab. Willam will have decreased or controlled stress upon completion of phase 2 cardiac rehab. Willam will have decreased or controlled stress upon completion of phase 2 cardiac rehab.     Interventions Stress management education;Encouraged to attend Cardiac Rehabilitation for the exercise Stress management education;Encouraged to attend Cardiac Rehabilitation for the exercise Stress management education;Encouraged to attend Cardiac Rehabilitation for the exercise     Continue Psychosocial Services  No Follow up required No Follow up required No Follow up required       Initial Review   Source of Stress Concerns Financial Financial Financial     Comments Will continue to montior and offer support as needed Will continue to montior and offer support as needed Will continue to montior and offer support as needed              Psychosocial Discharge (Final Psychosocial Re-Evaluation):  Psychosocial Re-Evaluation - 04/03/21 1704       Psychosocial Re-Evaluation   Current issues with Current Stress Concerns    Comments Willam has not voiced any increased stress concnerns since participating in phase 2 cardiac rehab. [facility] will complete CR on 04/07/21    Expected Outcomes Willam will have decreased or controlled stress upon completion of phase 2 cardiac rehab.    Interventions Stress management education;Encouraged to attend Cardiac Rehabilitation for the exercise    Continue Psychosocial Services  No Follow up required      Initial Review   Source of Stress Concerns Financial    Comments Will continue to montior and offer support as needed             Vocational Rehabilitation: Provide vocational rehab assistance to qualifying candidates.   Vocational Rehab Evaluation &  Intervention:  Vocational Rehab - 02/07/21 1129       Initial Vocational Rehab Evaluation & Intervention   Assessment shows need for Vocational Rehabilitation No   Emigdio is retired and does not need vocational rehab at this time            Education: Education Goals: Education classes will  be provided on a weekly basis, covering required topics. Participant will state understanding/return demonstration of topics presented.  Learning Barriers/Preferences:  Learning Barriers/Preferences - 02/07/21 1128       Learning Barriers/Preferences   Learning Barriers Exercise Concerns   Willam says he sometimes feels dizzy after taking his medication   Learning Preferences Audio;Computer/Internet;Group Instruction;Individual Instruction;Pictoral;Skilled Demonstration;Verbal Instruction;Video;Written Material             Education Topics: Count Your Pulse:  -Group instruction provided by verbal instruction, demonstration, patient participation and written materials to support subject.  Instructors address importance of being able to find your pulse and how to count your pulse when at home without a heart monitor.  Patients get hands on experience counting their pulse with staff help and individually.   Heart Attack, Angina, and Risk Factor Modification:  -Group instruction provided by verbal instruction, video, and written materials to support subject.  Instructors address signs and symptoms of angina and heart attacks.    Also discuss risk factors for heart disease and how to make changes to improve heart health risk factors.   Functional Fitness:  -Group instruction provided by verbal instruction, demonstration, patient participation, and written materials to support subject.  Instructors address safety measures for doing things around the house.  Discuss how to get up and down off the floor, how to pick things up properly, how to safely get out of a chair without assistance, and  balance training.   Meditation and Mindfulness:  -Group instruction provided by verbal instruction, patient participation, and written materials to support subject.  Instructor addresses importance of mindfulness and meditation practice to help reduce stress and improve awareness.  Instructor also leads participants through a meditation exercise.    Stretching for Flexibility and Mobility:  -Group instruction provided by verbal instruction, patient participation, and written materials to support subject.  Instructors lead participants through series of stretches that are designed to increase flexibility thus improving mobility.  These stretches are additional exercise for major muscle groups that are typically performed during regular warm up and cool down.   Hands Only CPR:  -Group verbal, video, and participation provides a basic overview of AHA guidelines for community CPR. Role-play of emergencies allow participants the opportunity to practice calling for help and chest compression technique with discussion of AED use.   Hypertension: -Group verbal and written instruction that provides a basic overview of hypertension including the most recent diagnostic guidelines, risk factor reduction with self-care instructions and medication management.    Nutrition I class: Heart Healthy Eating:  -Group instruction provided by PowerPoint slides, verbal discussion, and written materials to support subject matter. The instructor gives an explanation and review of the Therapeutic Lifestyle Changes diet recommendations, which includes a discussion on lipid goals, dietary fat, sodium, fiber, plant stanol/sterol esters, sugar, and the components of a well-balanced, healthy diet.   Nutrition II class: Lifestyle Skills:  -Group instruction provided by PowerPoint slides, verbal discussion, and written materials to support subject matter. The instructor gives an explanation and review of label reading,  grocery shopping for heart health, heart healthy recipe modifications, and ways to make healthier choices when eating out.   Diabetes Question & Answer:  -Group instruction provided by PowerPoint slides, verbal discussion, and written materials to support subject matter. The instructor gives an explanation and review of diabetes co-morbidities, pre- and post-prandial blood glucose goals, pre-exercise blood glucose goals, signs, symptoms, and treatment of hypoglycemia and hyperglycemia, and foot care basics.   Diabetes Blitz:  -  Group instruction provided by PowerPoint slides, verbal discussion, and written materials to support subject matter. The instructor gives an explanation and review of the physiology behind type 1 and type 2 diabetes, diabetes medications and rational behind using different medications, pre- and post-prandial blood glucose recommendations and Hemoglobin A1c goals, diabetes diet, and exercise including blood glucose guidelines for exercising safely.    Portion Distortion:  -Group instruction provided by PowerPoint slides, verbal discussion, written materials, and food models to support subject matter. The instructor gives an explanation of serving size versus portion size, changes in portions sizes over the last 20 years, and what consists of a serving from each food group.   Stress Management:  -Group instruction provided by verbal instruction, video, and written materials to support subject matter.  Instructors review role of stress in heart disease and how to cope with stress positively.     Exercising on Your Own:  -Group instruction provided by verbal instruction, power point, and written materials to support subject.  Instructors discuss benefits of exercise, components of exercise, frequency and intensity of exercise, and end points for exercise.  Also discuss use of nitroglycerin and activating EMS.  Review options of places to exercise outside of rehab.  Review  guidelines for sex with heart disease.   Cardiac Drugs I:  -Group instruction provided by verbal instruction and written materials to support subject.  Instructor reviews cardiac drug classes: antiplatelets, anticoagulants, beta blockers, and statins.  Instructor discusses reasons, side effects, and lifestyle considerations for each drug class.   Cardiac Drugs II:  -Group instruction provided by verbal instruction and written materials to support subject.  Instructor reviews cardiac drug classes: angiotensin converting enzyme inhibitors (ACE-I), angiotensin II receptor blockers (ARBs), nitrates, and calcium channel blockers.  Instructor discusses reasons, side effects, and lifestyle considerations for each drug class.   Anatomy and Physiology of the Circulatory System:  Group verbal and written instruction and models provide basic cardiac anatomy and physiology, with the coronary electrical and arterial systems. Review of: AMI, Angina, Valve disease, Heart Failure, Peripheral Artery Disease, Cardiac Arrhythmia, Pacemakers, and the ICD.   Other Education:  -Group or individual verbal, written, or video instructions that support the educational goals of the cardiac rehab program.   Holiday Eating Survival Tips:  -Group instruction provided by PowerPoint slides, verbal discussion, and written materials to support subject matter. The instructor gives patients tips, tricks, and techniques to help them not only survive but enjoy the holidays despite the onslaught of food that accompanies the holidays.   Knowledge Questionnaire Score:  Knowledge Questionnaire Score - 03/31/21 0756       Knowledge Questionnaire Score   Pre Score 19/24    Post Score 22/24             Core Components/Risk Factors/Patient Goals at Admission:  Personal Goals and Risk Factors at Admission - 02/07/21 1113       Core Components/Risk Factors/Patient Goals on Admission    Weight Management Yes;Weight  Maintenance    Intervention Weight Management: Develop a combined nutrition and exercise program designed to reach desired caloric intake, while maintaining appropriate intake of nutrient and fiber, sodium and fats, and appropriate energy expenditure required for the weight goal.;Weight Management: Provide education and appropriate resources to help participant work on and attain dietary goals.    Expected Outcomes Weight Maintenance: Understanding of the daily nutrition guidelines, which includes 25-35% calories from fat, 7% or less cal from saturated fats, less than 200mg  cholesterol, less than  1.5gm of sodium, & 5 or more servings of fruits and vegetables daily;Long Term: Adherence to nutrition and physical activity/exercise program aimed toward attainment of established weight goal    Hypertension Yes    Intervention Provide education on lifestyle modifcations including regular physical activity/exercise, weight management, moderate sodium restriction and increased consumption of fresh fruit, vegetables, and low fat dairy, alcohol moderation, and smoking cessation.;Monitor prescription use compliance.    Expected Outcomes Short Term: Continued assessment and intervention until BP is < 140/31mm HG in hypertensive participants. < 130/30mm HG in hypertensive participants with diabetes, heart failure or chronic kidney disease.;Long Term: Maintenance of blood pressure at goal levels.    Lipids Yes    Intervention Provide education and support for participant on nutrition & aerobic/resistive exercise along with prescribed medications to achieve LDL 70mg , HDL >40mg .    Expected Outcomes Short Term: Participant states understanding of desired cholesterol values and is compliant with medications prescribed. Participant is following exercise prescription and nutrition guidelines.;Long Term: Cholesterol controlled with medications as prescribed, with individualized exercise RX and with personalized nutrition plan.  Value goals: LDL < 70mg , HDL > 40 mg.    Personal Goal Other Yes    Personal Goal Breath better. Get nutrition infomration regarding heart healthy eating.    Intervention Provide education regarding heart healthy eating, lifestyle modification, dining out, and reading food labels. Provide education reagarding aerobic exercise to help improve cardiorespiratory fitness.    Expected Outcomes Participant knows heart healthy eating habits. Participant is following exercise prescription and nutrition guidelines.             Core Components/Risk Factors/Patient Goals Review:   Goals and Risk Factor Review     Row Name 02/13/21 1431 03/06/21 1704 04/03/21 1704         Core Components/Risk Factors/Patient Goals Review   Personal Goals Review Weight Management/Obesity;Hypertension;Lipids;Stress Weight Management/Obesity;Hypertension;Lipids;Stress Weight Management/Obesity;Hypertension;Lipids;Stress     Review Mujtaba started exercise at cardiac rehab on 02/13/21. Elmon did well with exercise. Resting and exertinal bP's were elevated. Kentarius has been doing well with exercise at phase 2 cardiac rehab. Blood pressures have imporved since Dr Johnsie Cancel increased his beta blocker. Garrus has been doing well with exercise at phase 2 cardiac rehab. Blood pressures have imporved since Dr Johnsie Cancel increased his beta blocker. Rush Landmark has done well with exercise and will complete cardiac rehab on 04/07/21     Expected Outcomes Johnie will continue to participate in phase 2 cardiac rehab for exercise, nutrition and lifestyle modifications Jacobi will continue to participate in phase 2 cardiac rehab for exercise, nutrition and lifestyle modifications Taimur will continue to exercise, follow  nutrition and lifestyle modifications upon completion of phase 2 CR              Core Components/Risk Factors/Patient Goals at Discharge (Final Review):   Goals and Risk Factor Review - 04/03/21 1704       Core  Components/Risk Factors/Patient Goals Review   Personal Goals Review Weight Management/Obesity;Hypertension;Lipids;Stress    Review Kayleb has been doing well with exercise at phase 2 cardiac rehab. Blood pressures have imporved since Dr Johnsie Cancel increased his beta blocker. Rush Landmark has done well with exercise and will complete cardiac rehab on 04/07/21    Expected Outcomes Dakotah will continue to exercise, follow  nutrition and lifestyle modifications upon completion of phase 2 CR             ITP Comments:  ITP Comments     Row Name 02/07/21 1020 02/13/21  1422 03/06/21 1701 04/03/21 1701     ITP Comments Medical Director- Dr. Fransico Him, MD 30 Day ITP Review. Christ started exercise at cardiac rehab on 02/13/21 nd did well with exercise. Exertional bP's were elevated. Dr Johnsie Cancel was notified via message in [facility] 30 Day ITP Review. Raevon has good attendance and participation in phase 2 cardiac rehab. Leven's BP's have been improved. 30 Day ITP Review. Jarrett has good attendance and participation in phase 2 cardiac rehab. Rush Landmark will complete phase 2 cardiac rehab on 04/07/21.             Comments: See ITP Comments

## 2021-04-05 ENCOUNTER — Encounter (HOSPITAL_COMMUNITY)
Admission: RE | Admit: 2021-04-05 | Discharge: 2021-04-05 | Disposition: A | Discharge status: Home / Self Care | Payer: Medicare HMO | Source: Ambulatory Visit | Attending: Cardiovascular Disease | Admitting: Cardiovascular Disease

## 2021-04-05 ENCOUNTER — Other Ambulatory Visit: Payer: Self-pay

## 2021-04-05 DIAGNOSIS — Z951 Presence of aortocoronary bypass graft: Secondary | ICD-10-CM

## 2021-04-05 DIAGNOSIS — Z48812 Encounter for surgical aftercare following surgery on the circulatory system: Secondary | ICD-10-CM | POA: Diagnosis not present

## 2021-04-07 ENCOUNTER — Encounter (HOSPITAL_COMMUNITY)
Admission: RE | Admit: 2021-04-07 | Discharge: 2021-04-07 | Disposition: A | Discharge status: Home / Self Care | Payer: Medicare HMO | Source: Ambulatory Visit | Attending: Cardiovascular Disease | Admitting: Cardiovascular Disease

## 2021-04-07 ENCOUNTER — Other Ambulatory Visit: Payer: Self-pay

## 2021-04-07 VITALS — BP 132/70 | HR 73 | Ht 68.25 in | Wt 162.9 lb

## 2021-04-07 DIAGNOSIS — Z951 Presence of aortocoronary bypass graft: Secondary | ICD-10-CM

## 2021-04-07 DIAGNOSIS — Z48812 Encounter for surgical aftercare following surgery on the circulatory system: Secondary | ICD-10-CM | POA: Diagnosis not present

## 2021-04-07 NOTE — Progress Notes (Signed)
Discharge Progress Report  Patient Details  Name: Tony Shaffer MRN: [id number] Date of Birth: 03-08-1951 Referring Provider:   Flowsheet Row CARDIAC REHAB PHASE II ORIENTATION from 02/07/2021 in [facility]  Referring Provider Josue Hector, MD        Number of Visits: 22  Reason for Discharge:  Patient reached a stable level of exercise. Patient independent in their exercise. Patient has met program and personal goals.  Smoking History:  Social History   Tobacco Use  Smoking Status Never  Smokeless Tobacco Never    Diagnosis:  S/P CABG x 4  ADL UCSD:   Initial Exercise Prescription:  Initial Exercise Prescription - 02/07/21 1300       Date of Initial Exercise RX and Referring Provider   Date 02/07/21    Referring Provider Josue Hector, MD    Expected Discharge Date 04/07/21      Bike   Level 2    Minutes 15    METs 3.2      NuStep   Level 3    SPM 85    Minutes 15    METs 3      Prescription Details   Frequency (times per week) 3    Duration Progress to 30 minutes of continuous aerobic without signs/symptoms of physical distress      Intensity   THRR 40-80% of Max Heartrate 60-120    Ratings of Perceived Exertion 11-13    Perceived Dyspnea 0-4      Progression   Progression Continue to progress workloads to maintain intensity without signs/symptoms of physical distress.      Resistance Training   Training Prescription Yes    Weight 4 lbs    Reps 10-15             Discharge Exercise Prescription (Final Exercise Prescription Changes):  Exercise Prescription Changes - 04/07/21 1016       Response to Exercise   Blood Pressure (Admit) 132/70    Blood Pressure (Exercise) 142/60    Blood Pressure (Exit) 138/78    Heart Rate (Admit) 73 bpm    Heart Rate (Exercise) 117 bpm    Heart Rate (Exit) 75 bpm    Rating of Perceived Exertion (Exercise) 11    Symptoms None    Comments Last cardiac rehab  session.    Duration Continue with 30 min of aerobic exercise without signs/symptoms of physical distress.    Intensity THRR unchanged      Progression   Progression Continue to progress workloads to maintain intensity without signs/symptoms of physical distress.    Average METs 4.6      Resistance Training   Training Prescription Yes    Weight 6 lbs    Reps 10-15    Time 10 Minutes      Interval Training   Interval Training No      Bike   Level 4.9    Minutes 15    METs 5.2      NuStep   Level 4    SPM 85    Minutes 15    METs 4.1      Home Exercise Plan   Plans to continue exercise at [employer] (comment)   Walking, Biking at home   Frequency Add 2 additional days to program exercise sessions.    Initial Home Exercises Provided 03/03/21             Functional Capacity:  6 Minute Walk  [place] Name 02/07/21 1143 03/31/21 1036       6 Minute Walk   Phase Initial Discharge    Distance 1857 feet 1725 feet    Distance % Change -- 7.11 %    Distance Feet Change -- 132 ft    Walk Time 6 minutes 6 minutes    # of Rest Breaks 0 0    MPH 3.52 3.27    METS 4.48 4.16    RPE 12 10    Perceived Dyspnea  1 1    VO2 Peak 15.67 14.56    Symptoms Yes (comment) Yes (comment)    Comments Mild shortness of breath. Mild shortness of breath    Resting HR 78 bpm 71 bpm    Resting BP 138/64 132/68    Resting Oxygen Saturation  99 % 100 %    Exercise Oxygen Saturation  during 6 min walk 97 % 96 %    Max Ex. HR 116 bpm 103 bpm    Max Ex. BP 158/80 168/72    2 Minute Post BP 160/70 124/72             Psychological, QOL, Others - Outcomes: PHQ 2/9: Depression screen [facility] 2/9 04/07/2021 02/07/2021  Decreased Interest 0 0  Down, Depressed, Hopeless 0 0  PHQ - 2 Score 0 0    Quality of Life:  Quality of Life - 03/31/21 0802       Quality of Life   Select Quality of Life      Quality of Life Scores   Health/Function Pre 29.1 %    Health/Function Post  25.82 %    Health/Function % Change -11.27 %    Socioeconomic Pre 28.33 %    Socioeconomic Post 27.64 %    Socioeconomic % Change  -2.44 %    Psych/Spiritual Pre 30 %    Psych/Spiritual Post 30 %    Psych/Spiritual % Change 0 %    Family Pre 30 %    Family Post 25.5 %    Family % Change -15 %    GLOBAL Pre 29.27 %    GLOBAL Post 27.15 %    GLOBAL % Change -7.24 %             Personal Goals: Goals established at orientation with interventions provided to work toward goal.  Personal Goals and Risk Factors at Admission - 02/07/21 1113       Core Components/Risk Factors/Patient Goals on Admission    Weight Management Yes;Weight Maintenance    Intervention Weight Management: Develop a combined nutrition and exercise program designed to reach desired caloric intake, while maintaining appropriate intake of nutrient and fiber, sodium and fats, and appropriate energy expenditure required for the weight goal.;Weight Management: Provide education and appropriate resources to help participant work on and attain dietary goals.    Expected Outcomes Weight Maintenance: Understanding of the daily nutrition guidelines, which includes 25-35% calories from fat, 7% or less cal from saturated fats, less than 272m cholesterol, less than 1.5gm of sodium, & 5 or more servings of fruits and vegetables daily;Long Term: Adherence to nutrition and physical activity/exercise program aimed toward attainment of established weight goal    Hypertension Yes    Intervention Provide education on lifestyle modifcations including regular physical activity/exercise, weight management, moderate sodium restriction and increased consumption of fresh fruit, vegetables, and low fat dairy, alcohol moderation, and smoking cessation.;Monitor prescription use compliance.    Expected Outcomes Short Term: Continued assessment and intervention until  BP is < 140/94m HG in hypertensive participants. < 130/836mHG in hypertensive  participants with diabetes, heart failure or chronic kidney disease.;Long Term: Maintenance of blood pressure at goal levels.    Lipids Yes    Intervention Provide education and support for participant on nutrition & aerobic/resistive exercise along with prescribed medications to achieve LDL <7074mHDL >80m66m  Expected Outcomes Short Term: Participant states understanding of desired cholesterol values and is compliant with medications prescribed. Participant is following exercise prescription and nutrition guidelines.;Long Term: Cholesterol controlled with medications as prescribed, with individualized exercise RX and with personalized nutrition plan. Value goals: LDL < 70mg63mL > 40 mg.    Personal Goal Other Yes    Personal Goal Breath better. Get nutrition infomration regarding heart healthy eating.    Intervention Provide education regarding heart healthy eating, lifestyle modification, dining out, and reading food labels. Provide education reagarding aerobic exercise to help improve cardiorespiratory fitness.    Expected Outcomes Participant knows heart healthy eating habits. Participant is following exercise prescription and nutrition guidelines.              Personal Goals Discharge:  Goals and Risk Factor Review     Row Name 02/13/21 1431 03/06/21 1704 04/03/21 1704         Core Components/Risk Factors/Patient Goals Review   Personal Goals Review Weight Management/Obesity;Hypertension;Lipids;Stress Weight Management/Obesity;Hypertension;Lipids;Stress Weight Management/Obesity;Hypertension;Lipids;Stress     Review WilliOdated exercise at cardiac rehab on 02/13/21. WilliNewtwell with exercise. Resting and exertinal bP's were elevated. WilliSimranbeen doing well with exercise at phase 2 cardiac rehab. Blood pressures have imporved since Dr NishaJohnsie Canceleased his beta blocker. WilliCinchbeen doing well with exercise at phase 2 cardiac rehab. Blood pressures have imporved since  Dr NishaJohnsie Canceleased his beta blocker. Bill Rush Landmarkdone well with exercise and will complete cardiac rehab on 04/07/21     Expected Outcomes WilliGlendel continue to participate in phase 2 cardiac rehab for exercise, nutrition and lifestyle modifications WilliNakul continue to participate in phase 2 cardiac rehab for exercise, nutrition and lifestyle modifications WilliYuri continue to exercise, follow  nutrition and lifestyle modifications upon completion of phase 2 CR              Exercise Goals and Review:  Exercise Goals     Row Name 02/07/21 1022             Exercise Goals   Increase Physical Activity Yes       Intervention Provide advice, education, support and counseling about physical activity/exercise needs.;Develop an individualized exercise prescription for aerobic and resistive training based on initial evaluation findings, risk stratification, comorbidities and participant's personal goals.       Expected Outcomes Short Term: Attend rehab on a regular basis to increase amount of physical activity.;Long Term: Exercising regularly at least 3-5 days a week.;Long Term: Add in home exercise to make exercise part of routine and to increase amount of physical activity.       Increase Strength and Stamina Yes       Intervention Provide advice, education, support and counseling about physical activity/exercise needs.;Develop an individualized exercise prescription for aerobic and resistive training based on initial evaluation findings, risk stratification, comorbidities and participant's personal goals.       Expected Outcomes Short Term: Increase workloads from initial exercise prescription for resistance, speed, and METs.;Short Term: Perform resistance training exercises routinely during rehab and add in resistance training at home;Long Term:  Improve cardiorespiratory fitness, muscular endurance and strength as measured by increased METs and functional capacity (6MWT)       Able to  understand and use rate of perceived exertion (RPE) scale Yes       Intervention Provide education and explanation on how to use RPE scale       Expected Outcomes Short Term: Able to use RPE daily in rehab to express subjective intensity level;Long Term:  Able to use RPE to guide intensity level when exercising independently       Knowledge and understanding of Target Heart Rate Range (THRR) Yes       Intervention Provide education and explanation of THRR including how the numbers were predicted and where they are located for reference       Expected Outcomes Short Term: Able to state/look up THRR;Long Term: Able to use THRR to govern intensity when exercising independently;Short Term: Able to use daily as guideline for intensity in rehab       Able to check pulse independently Yes       Intervention Provide education and demonstration on how to check pulse in carotid and radial arteries.;Review the importance of being able to check your own pulse for safety during independent exercise       Expected Outcomes Short Term: Able to explain why pulse checking is important during independent exercise;Long Term: Able to check pulse independently and accurately       Understanding of Exercise Prescription Yes       Intervention Provide education, explanation, and written materials on patient's individual exercise prescription       Expected Outcomes Short Term: Able to explain program exercise prescription;Long Term: Able to explain home exercise prescription to exercise independently                Exercise Goals Re-Evaluation:  Exercise Goals Re-Evaluation     Row Name 02/13/21 1159 02/27/21 1112 03/03/21 1101 03/29/21 1013 04/07/21 1136     Exercise Goal Re-Evaluation   Exercise Goals Review Increase Physical Activity;Able to understand and use rate of perceived exertion (RPE) scale Increase Physical Activity;Able to understand and use rate of perceived exertion (RPE) scale Increase Physical  Activity;Able to understand and use rate of perceived exertion (RPE) scale;Increase Strength and Stamina;Able to check pulse independently;Knowledge and understanding of Target Heart Rate Range (THRR);Understanding of Exercise Prescription Increase Physical Activity;Able to understand and use rate of perceived exertion (RPE) scale;Increase Strength and Stamina;Able to check pulse independently;Knowledge and understanding of Target Heart Rate Range (THRR);Understanding of Exercise Prescription Increase Physical Activity;Able to understand and use rate of perceived exertion (RPE) scale;Increase Strength and Stamina;Able to check pulse independently;Knowledge and understanding of Target Heart Rate Range (THRR);Understanding of Exercise Prescription   Comments Patient able to understand and use RPE scale appropriately. Systolic BP elevated at rest and with exercise. Patient is progressing well with exercise. Patient's goal is to get back to walking/jogging and to build muscle. Increased hand weights today. Reviewed exercise prescription with patient. Patient plans to walk at least 30 minutes, 2 days/week as his mode of home exercise. Patient doesn't have hand weights at home but can use household items for his resistance training. Instructed patient on how to count his pulse. Patient states he's not consistent with his walking at home because he's had a lot going on but [facility] [place] [facility] Patient states he feels a whole lot better now than when he started the program. Patient reports  less shortness of breath, only with heavy exercise. Patient completed the cardiac rehab program and [facility] and [facility] at [facility],   Expected Outcomes Progress workloads as tolerated keeping blood pressure within normal limits. Continue to increase workloads as tolerated. Patient will add 30 minutes of walking at home 1-2 days/week in addition to exercise at cardiac rehab.  Patient will increase walking at home and possibly [facility] upon completion of the cardiac rehab program. Patient will [facility] or [facility] 30 minutes 5-7 days/week to maintain health and fitness gains.            Nutrition & Weight - Outcomes:  Pre Biometrics - 02/07/21 1020       Pre Biometrics   Waist Circumference 34.5 inches    Hip Circumference 38.5 inches    Waist to Hip Ratio 0.9 %    Triceps Skinfold 14 mm    % Body Fat 23.5 %    Grip Strength 42 kg    Flexibility 12.5 in    Single Leg Stand 30 seconds             Post Biometrics - 04/07/21 1025        Post  Biometrics   Height 5' 8.25" (1.734 m)    Waist Circumference 34 inches    Hip Circumference 38.5 inches    Waist to Hip Ratio 0.88 %    BMI (Calculated) 24.58    Triceps Skinfold 17 mm    % Body Fat 24.1 %    Grip Strength 47 kg    Flexibility 14 in    Single Leg Stand 30 seconds             Nutrition:   Nutrition Discharge:   Education Questionnaire Score:  Knowledge Questionnaire Score - 03/31/21 0756       Knowledge Questionnaire Score   Pre Score 19/24    Post Score 22/24             Goals reviewed with patient; copy given to patient.Pt graduated from cardiac rehab program on 04/07/21 with completion of  22 exercise sessions in Phase II. Pt maintained good attendance and progressed nicely during his participation in rehab as evidenced by increased MET level.   Medication list reconciled. Repeat  PHQ score- 0 .  Pt has made significant lifestyle changes and should be commended for his success. Pt feels he has achieved his goals during cardiac rehab.   Pt plans to continue exercise by walking and riding his bike and going to the [facility] participating in the silver sneakers program. Rush Landmark increased his distance on his post exercise walk test by 132 feet. We are proud of Bill's progress!Harrell Gave RN BSN

## 2021-04-13 ENCOUNTER — Other Ambulatory Visit (HOSPITAL_BASED_OUTPATIENT_CLINIC_OR_DEPARTMENT_OTHER): Payer: Self-pay | Admitting: *Deleted

## 2021-04-13 MED ORDER — ROSUVASTATIN CALCIUM 20 MG PO TABS: 20.0000 mg | ORAL_TABLET | Freq: Every day | ORAL | 1 refills | Status: DC

## 2021-04-14 ENCOUNTER — Telehealth (HOSPITAL_BASED_OUTPATIENT_CLINIC_OR_DEPARTMENT_OTHER): Payer: Self-pay

## 2021-04-14 MED ORDER — CLOPIDOGREL BISULFATE 75 MG PO TABS: 75.0000 mg | ORAL_TABLET | Freq: Every day | ORAL | 3 refills | Status: DC

## 2021-04-14 NOTE — Telephone Encounter (Signed)
Pharmacy called for new prescription for Plavix 75mg  daily.

## 2021-04-18 ENCOUNTER — Other Ambulatory Visit: Payer: Self-pay

## 2021-04-18 MED ORDER — CLOPIDOGREL BISULFATE 75 MG PO TABS: 75.0000 mg | ORAL_TABLET | Freq: Every day | ORAL | 2 refills | Status: DC

## 2021-04-18 MED ORDER — ROSUVASTATIN CALCIUM 20 MG PO TABS: 20.0000 mg | ORAL_TABLET | Freq: Every day | ORAL | 2 refills | Status: DC

## 2021-04-25 NOTE — Addendum Note (Signed)
Encounter addended by: Magda Kiel, RN on: 04/25/2021 8:11 AM  Actions taken: Clinical Note Signed

## 2021-05-08 NOTE — Progress Notes (Signed)
Office Visit    Patient Name: Tony Shaffer Date of Encounter: 05/08/2021  PCP:  Sonia Side[facility]  Cardiologist:  Jenkins Rouge, MD  Advanced Practice Provider:  No care team member to display Electrophysiologist:  None   Chief Complaint    Tony Shaffer is a 71 y.o. male with a hx of BPH, CAD s/p CABG, RBBB, LAFB, bilateral carotid artery stenosis (08/2020 1-39%), hypertension presents today for followup after CABG.   Past Medical History    Past Medical History:  Diagnosis Date   Anticoagulated by anticoagulation treatment    plavix--- managed by cardiology   Benign prostatic hyperplasia with urinary retention    urologist-- dr Junious Silk;   s/p prostatectomy in  08/ 2014 for very enlarged prostate   Coronary artery disease    cardiologist--- dr Johnsie Cancel---  hx nstemi 08-24-2020, s/p cabg x4 08-26-2020   Foley catheter in place    GERD (gastroesophageal reflux disease)    watches diet   History of non-ST elevation myocardial infarction (NSTEMI) 08/24/2020   Hyperlipidemia    Hypertension    RBBB (right bundle branch block with left anterior fascicular block)    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   LIMA to LAD, SVG to PDA, SVG to OM and Diagonal   Seasonal allergies    Past Surgical History:  Procedure Laterality Date   CARDIAC CATHETERIZATION     CORONARY ARTERY BYPASS GRAFT N/A 08/26/2020   Procedure: CORONARY ARTERY BYPASS GRAFTING (CABG), ON PUMP, TIMES FOUR, USING LEFT INTERNAL MAMMARY ARTERY AND RIGHT ENDOSCOPICALLY HARVESTED GREATER SAPHENOUS VEIN;  Surgeon: Wonda Olds, MD;  Location: [facility];  Service: Open Heart Surgery;  Laterality: N/A;   ENDOVEIN HARVEST OF GREATER SAPHENOUS VEIN Right 08/26/2020   Procedure: ENDOVEIN HARVEST OF GREATER SAPHENOUS VEIN;  Surgeon: Wonda Olds, MD;  Location: [facility];  Service: Open Heart Surgery;  Laterality: Right;   LEFT HEART CATH AND CORONARY ANGIOGRAPHY N/A 08/24/2020   Procedure:  LEFT HEART CATH AND CORONARY ANGIOGRAPHY;  Surgeon: Burnell Blanks, MD;  Location: [facility];  Service: Cardiovascular;  Laterality: N/A;   PROSTATECTOMY N/A 10/17/2012   Procedure: TRANSVESICLE PROSTATECTOMY SUPRAPUBIC;  Surgeon: Ailene Rud, MD;  Location: [facility];  Service: Urology;  Laterality: N/A;   TEE WITHOUT CARDIOVERSION N/A 08/26/2020   Procedure: TRANSESOPHAGEAL ECHOCARDIOGRAM (TEE);  Surgeon: Wonda Olds, MD;  Location: [facility];  Service: Open Heart Surgery;  Laterality: N/A;   TRANSURETHRAL RESECTION OF PROSTATE N/A 11/25/2020   Procedure: TRANSURETHRAL RESECTION OF THE PROSTATE (TURP);  Surgeon: Festus Aloe, MD;  Location[facility];  Service: Urology;  Laterality: N/A;  [address] [place] EXTRACTION      Allergies  No Known Allergies  History of Present Illness    First seen by me 08/24/20 at [facility] ER for accelerating angina with mildly elevated troponin 85. Chronic RBBB Cath 08/24/20 with 50% LM, 99% ostial LAD, 50% dominant mid circumflex LAD thought to be functional total occlusion EF normal Subsequently had CABG with Dr Orvan Seen with LIMA to LAD, SVG to PDA, Sequential SVG to OM and Diagonal Vein harvest from right thigh and leg Pre CABG dopplers plaque no stenosis .  He has some paresthesias sternotomy  Finished cardiac rehab this winter   Prostate issues sees [facility] had TURP 11/25/20 after open simple prostatectomy in 2014   Doing well Living with his brother  who is a Retail banker in church now. Has one sister here with autistic husband and a sister in [place] still  EKGs/Labs/Other Studies Reviewed:   The following studies were reviewed today:  Echo 08/25/20  1. Left ventricular ejection fraction, by estimation, is 55 to 60%. The  left ventricle has normal function. The left ventricle demonstrates  regional wall motion abnormalities with apical hypokinesis. Left  ventricular diastolic parameters are consistent   with Grade I diastolic dysfunction (impaired relaxation).   2. Right ventricular systolic function is normal. The right ventricular  size is normal. Tricuspid regurgitation signal is inadequate for assessing  PA pressure.   3. The mitral valve is normal in structure. No evidence of mitral valve  regurgitation. No evidence of mitral stenosis.   4. The aortic valve is tricuspid. Aortic valve regurgitation is not  visualized. Mild aortic valve sclerosis is present, with no evidence of  aortic valve stenosis.   5. The IVC was not visualized.  VAS [place] pre CABG 08/25/20 Summary:  Right Carotid: Velocities in the right ICA are consistent with a 1-39%  stenosis.   Left Carotid: Velocities in the left ICA are consistent with a 1-39%  stenosis.  Vertebrals: Bilateral vertebral arteries demonstrate antegrade flow.  Subclavians: Normal flow hemodynamics were seen in bilateral subclavian               arteries.  LHC 08/24/20  Ost Cx to Prox Cx lesion is 40% stenosed. Dist Cx lesion is 40% stenosed. Ost LM to Mid LM lesion is 50% stenosed. Ost LAD to Prox LAD lesion is 99% stenosed. The left ventricular systolic function is normal. LV end diastolic pressure is normal. The left ventricular ejection fraction is greater than 65% by visual estimate. There is no mitral valve regurgitation.   1. Moderate distal left main stenosis 2. Severe, heavily calcified ostial/proximal LAD stenosis. This is a functional chronic occlusion. The LAD also fills from right to left collaterals through a small, early branch. 3. Mild disease in the dominant Circumflex 4. Small non-dominant RCA 5. Normal LV systolic function   Recommendations: We will ask  CT surgery to see him tomorrow to review candidacy for bypass surgery. I think CABG is the best option for revascularization. I have reviewed the films with Dr. Fletcher Anon as well. Continue ASA and statin. Will restart IV heparin 6 hours post sheath pull. EKG:  EKG is  ordered today.  The ekg ordered today demonstrates normal sinus rhythm 100bpm, RBBB and LAFB have returned since last tracing. Stable TWI in V2. No acute St/T wave changes.   Recent Labs: 08/23/2020: B Natriuretic Peptide 56.6 08/27/2020: Magnesium 2.4 09/16/2020: Platelets 766 11/25/2020: BUN 13; Creatinine, Ser 0.90; Hemoglobin 13.9; Potassium 4.3; Sodium 138 12/30/2020: ALT 30  Recent Lipid Panel    Component Value Date/Time   CHOL 139 12/30/2020 1042   TRIG 87 12/30/2020 1042   HDL 50 12/30/2020 1042   CHOLHDL 2.8 12/30/2020 1042   CHOLHDL 3.8 08/24/2020 2022   VLDL 26 08/24/2020 2022   [place] 72 12/30/2020 1042    Home Medications   No outpatient medications have been marked as taking for the 05/11/21 encounter (Appointment) with Josue Hector, MD.     Review of Systems    All other systems reviewed and are otherwise negative except as noted above.  Physical Exam    VS:  There were no vitals taken for this visit. , BMI There is no height or weight on file to calculate BMI.  Wt Readings from Last 3 Encounters:  04/07/21 162 lb 14.7 oz (73.9 kg)  02/07/21 160 lb 7.9 oz (72.8 kg)  12/23/20 159 lb (72.1 kg)    GEN: Well nourished, well developed, in no acute distress. HEENT: normal. Neck: Supple, no JVD, carotid bruits, or masses. Cardiac: RRR, no murmurs, rubs, or gallops. No clubbing, cyanosis, edema.  Radials/DP/PT 2+ and equal bilaterally.  Respiratory:  Respirations regular and unlabored, clear to auscultation bilaterally. GI: Soft, nontender, nondistended. MS: No deformity or atrophy. Skin: Warm and dry, no rash. Midsternal incision with edges well approximated. Chest tube suture sites with steri strips. No exudate, erythema, signs of infection.  Neuro:  Strength and sensation are intact. Psych: Normal affect.  Assessment & Plan    CAD s/p CABG -  08/26/20 normal LV continue ASA/Beta blocker and statin  Finished with cardiac rehab trying to do Silver Sneakers at  [facility]  HTN - D/c hydralazine start Cozaar 100 mg daily   GERD - Continue Protonix.   HLD, LDL goal <70 - Intolerance to Atorvastatin with dizziness. On crestor 20 mg dailyLDL 72 at goal 12/30/20  Carotid artery disease - 08/2020 bilateral 1-39% ICA stenosis. Continue aspirin, statin.  Prostate:  f/u [place] post prostatectomy / TURP      Disposition: Follow up  in 3 months with [facility] on ARB  Signed, Jenkins Rouge, MD 05/08/2021, 3:20 PM [place]

## 2021-05-11 ENCOUNTER — Other Ambulatory Visit: Payer: Self-pay

## 2021-05-11 ENCOUNTER — Ambulatory Visit: Payer: Medicare HMO | Admitting: Cardiovascular Disease

## 2021-05-11 ENCOUNTER — Encounter: Payer: Self-pay | Admitting: Cardiovascular Disease

## 2021-05-11 VITALS — BP 140/70 | HR 74 | Ht 69.0 in | Wt 164.0 lb

## 2021-05-11 DIAGNOSIS — I6523 Occlusion and stenosis of bilateral carotid arteries: Secondary | ICD-10-CM

## 2021-05-11 DIAGNOSIS — E785 Hyperlipidemia, unspecified: Secondary | ICD-10-CM

## 2021-05-11 DIAGNOSIS — Z951 Presence of aortocoronary bypass graft: Secondary | ICD-10-CM | POA: Diagnosis not present

## 2021-05-11 DIAGNOSIS — I10 Essential (primary) hypertension: Secondary | ICD-10-CM | POA: Diagnosis not present

## 2021-05-11 MED ORDER — LOSARTAN POTASSIUM 100 MG PO TABS: 100.0000 mg | ORAL_TABLET | Freq: Every day | ORAL | 3 refills | Status: DC

## 2021-05-11 NOTE — Patient Instructions (Addendum)
Medication Instructions:  Your physician has recommended you make the following change in your medication:  1-STOP Hydralazine  2-START Losartan 100 mg by mouth daily (Take between Metoprolol doses)  *If you need a refill on your cardiac medications before your next appointment, please call your pharmacy*  Lab Work: If you have labs (blood work) drawn today and your tests are completely normal, you will receive your results only by: [place] (if you have MyChart) OR A paper copy in the mail If you have any lab test that is abnormal or we need to change your treatment, we will call you to review the results.  Testing/Procedures: None ordered today.  Follow-Up: At [facility], you and your health needs are our priority.  As part of our continuing mission to provide you with exceptional heart care, we have created designated Provider Care Teams.  These Care Teams include your primary Cardiologist (physician) and Advanced Practice Providers (APPs -  Physician Assistants and Nurse Practitioners) who all work together to provide you with the care you need, when you need it.  We recommend signing up for the patient portal called "MyChart".  Sign up information is provided on this After Visit Summary.  MyChart is used to connect with patients for Virtual Visits (Telemedicine).  Patients are able to view lab/test results, encounter notes, upcoming appointments, etc.  Non-urgent messages can be sent to your provider as well.   To learn more about what you can do with MyChart, go to [email or web address].    Your next appointment:   3 month(s)  The format for your next appointment:   In Person  Provider:   Jenkins Rouge, MD {

## 2021-06-19 ENCOUNTER — Other Ambulatory Visit: Payer: Self-pay | Admitting: Physician Assistant

## 2021-08-08 NOTE — Progress Notes (Signed)
Office Visit    Patient Name: Tony Shaffer Date of Encounter: 08/17/2021  PCP:  Sonia Side., [facility]  Cardiologist:  Jenkins Rouge, MD  Advanced Practice Provider:  No care team member to display Electrophysiologist:  None   Chief Complaint    Tony Shaffer is a 71 y.o. male with a hx of BPH, CAD s/p CABG, RBBB, LAFB, bilateral carotid artery stenosis (08/2020 1-39%), hypertension presents today for followup after CABG.   Past Medical History    Past Medical History:  Diagnosis Date   Anticoagulated by anticoagulation treatment    plavix--- managed by cardiology   Benign prostatic hyperplasia with urinary retention    urologist-- dr Junious Silk;   s/p prostatectomy in  08/ 2014 for very enlarged prostate   Coronary artery disease    cardiologist--- dr Johnsie Cancel---  hx nstemi 08-24-2020, s/p cabg x4 08-26-2020   Foley catheter in place    GERD (gastroesophageal reflux disease)    watches diet   History of non-ST elevation myocardial infarction (NSTEMI) 08/24/2020   Hyperlipidemia    Hypertension    RBBB (right bundle branch block with left anterior fascicular block)    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   LIMA to LAD, SVG to PDA, SVG to OM and Diagonal   Seasonal allergies    Past Surgical History:  Procedure Laterality Date   CARDIAC CATHETERIZATION     CORONARY ARTERY BYPASS GRAFT N/A 08/26/2020   Procedure: CORONARY ARTERY BYPASS GRAFTING (CABG), ON PUMP, TIMES FOUR, USING LEFT INTERNAL MAMMARY ARTERY AND RIGHT ENDOSCOPICALLY HARVESTED GREATER SAPHENOUS VEIN;  Surgeon: Wonda Olds, MD;  Location: [place];  Service: Open Heart Surgery;  Laterality: N/A;   ENDOVEIN HARVEST OF GREATER SAPHENOUS VEIN Right 08/26/2020   Procedure: ENDOVEIN HARVEST OF GREATER SAPHENOUS VEIN;  Surgeon: Wonda Olds, MD;  Location: [place];  Service: Open Heart Surgery;  Laterality: Right;   LEFT HEART CATH AND CORONARY ANGIOGRAPHY N/A 08/24/2020   Procedure:  LEFT HEART CATH AND CORONARY ANGIOGRAPHY;  Surgeon: Burnell Blanks, MD;  Location: [place] [facility];  Service: Cardiovascular;  Laterality: N/A;   PROSTATECTOMY N/A 10/17/2012   Procedure: TRANSVESICLE PROSTATECTOMY SUPRAPUBIC;  Surgeon: Ailene Rud, MD;  Location: [facility];  Service: Urology;  Laterality: N/A;   TEE WITHOUT CARDIOVERSION N/A 08/26/2020   Procedure: TRANSESOPHAGEAL ECHOCARDIOGRAM (TEE);  Surgeon: Wonda Olds, MD;  Location: [place];  Service: Open Heart Surgery;  Laterality: N/A;   TRANSURETHRAL RESECTION OF PROSTATE N/A 11/25/2020   Procedure: TRANSURETHRAL RESECTION OF THE PROSTATE (TURP);  Surgeon: Festus Aloe, MD;  Location: [facility];  Service: Urology;  Laterality: N/A;  9 [place] EXTRACTION      Allergies  No Known Allergies  History of Present Illness    First seen by me 08/24/20 at [facility] ER for accelerating angina with mildly elevated troponin 85. Chronic RBBB Cath 08/24/20 with 50% LM, 99% ostial LAD, 50% dominant mid circumflex LAD thought to be functional total occlusion EF normal Subsequently had CABG with Dr Orvan Seen with LIMA to LAD, SVG to PDA, Sequential SVG to OM and Diagonal Vein harvest from right thigh and leg Pre CABG dopplers plaque no stenosis .  He has some paresthesias sternotomy  Finished cardiac rehab this winter   Prostate issues sees [facility] had TURP 11/25/20 after open simple prostatectomy in 2014   Doing well Living with his brother  who is a Retail banker in church now. 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 Left ventricular ejection fraction, by estimation, is 55 to 60%. The  left ventricle has normal function. The left ventricle demonstrates  regional  wall motion abnormalities with apical hypokinesis. Left  ventricular diastolic parameters are consistent  with Grade I diastolic dysfunction (impaired relaxation).   2. Right ventricular systolic function is normal. The right ventricular  size is normal. Tricuspid regurgitation signal is inadequate for assessing  PA pressure.   3. The mitral valve is normal in structure. No evidence of mitral valve  regurgitation. No evidence of mitral stenosis.   4. The aortic valve is tricuspid. Aortic valve regurgitation is not  visualized. Mild aortic valve sclerosis is present, with no evidence of  aortic valve stenosis.   5. The IVC was not visualized.  VAS [place] pre CABG 08/25/20 Summary:  Right Carotid: Velocities in the right ICA are consistent with a 1-39%  stenosis.   Left Carotid: Velocities in the left ICA are consistent with a 1-39%  stenosis.  Vertebrals: Bilateral vertebral arteries demonstrate antegrade flow.  Subclavians: Normal flow hemodynamics were seen in bilateral subclavian               arteries.  LHC 08/24/20  Ost Cx to Prox Cx lesion is 40% stenosed. Dist Cx lesion is 40% stenosed. Ost LM to Mid LM lesion is 50% stenosed. Ost LAD to Prox LAD lesion is 99% stenosed. The left ventricular systolic function is normal. LV end diastolic pressure is normal. The left ventricular ejection fraction is greater than 65% by visual estimate. There is no mitral valve regurgitation.   1. Moderate distal left main stenosis 2. Severe, heavily calcified ostial/proximal LAD stenosis. This is a functional chronic occlusion. The LAD also fills from right to left collaterals through a small, early branch. 3. Mild disease in the dominant Circumflex 4. Small non-dominant RCA 5. Normal LV systolic function   Recommendations: We will ask  CT surgery to see him tomorrow to review candidacy for bypass surgery. I think CABG is the best option for revascularization. I have reviewed the films with Dr.  Fletcher Anon as well. Continue ASA and statin. Will restart IV heparin 6 hours post sheath pull.   EKG:  SR rate 62 RBBB 08/17/2021 .   Recent Labs: 08/23/2020: B Natriuretic Peptide 56.6 08/27/2020: Magnesium 2.4 09/16/2020: Platelets 766 11/25/2020: BUN 13; Creatinine, Ser 0.90; Hemoglobin 13.9; Potassium 4.3; Sodium 138 12/30/2020: ALT 30  Recent Lipid Panel    Component Value Date/Time   CHOL 139 12/30/2020 1042   TRIG 87 12/30/2020 1042   HDL 50 12/30/2020 1042   CHOLHDL 2.8 12/30/2020 1042   CHOLHDL 3.8 08/24/2020 2022   VLDL 26 08/24/2020 2022   LDLCALC 72 12/30/2020 1042    Home Medications   Current Meds  Medication Sig   aspirin EC 81 MG tablet Take 81 mg by mouth daily. Swallow whole.   cetirizine (ZYRTEC) 10 MG tablet Take 10 mg by mouth daily.   Cholecalciferol (VITAMIN D3) 50 MCG (2000 UT) TABS Take 2,000 Units by mouth daily.   clopidogrel (PLAVIX) 75 MG tablet Take 1 tablet (75 mg total) by mouth daily.   finasteride (PROSCAR) 5  MG tablet Take 5 mg by mouth daily.   losartan (COZAAR) 100 MG tablet Take 1 tablet (100 mg total) by mouth daily.   metoprolol tartrate (LOPRESSOR) 50 MG tablet Take 1.5 tablets (75 mg total) by mouth 2 (two) times daily.   neomycin-bacitracin-polymyxin (NEOSPORIN) ointment Apply 1 application. topically daily as needed (rash).   rosuvastatin (CRESTOR) 20 MG tablet Take 1 tablet (20 mg total) by mouth daily.   triamcinolone ointment (KENALOG) 0.1 % Apply 1 application topically 2 (two) times daily as needed.     Review of Systems    All other systems reviewed and are otherwise negative except as noted above.  Physical Exam    VS:  BP 134/72   Pulse 62   Ht '5\' 9"'$  (1.753 m)   Wt 171 lb (77.6 kg)   SpO2 98%   BMI 25.25 kg/m  , BMI Body mass index is 25.25 kg/m.  Wt Readings from Last 3 Encounters:  08/17/21 171 lb (77.6 kg)  05/11/21 164 lb (74.4 kg)  04/07/21 162 lb 14.7 oz (73.9 kg)    GEN: Well nourished, well developed, in no acute  distress. HEENT: normal. Neck: Supple, no JVD, carotid bruits, or masses. Cardiac: RRR, no murmurs, rubs, or gallops. No clubbing, cyanosis, edema.  Radials/DP/PT 2+ and equal bilaterally.  Respiratory:  Respirations regular and unlabored, clear to auscultation bilaterally. GI: Soft, nontender, nondistended. MS: No deformity or atrophy. Skin: Warm and dry, no rash. Midsternal incision with edges well approximated. Chest tube suture sites with steri strips. No exudate, erythema, signs of infection.  Neuro:  Strength and sensation are intact. Psych: Normal affect.  Assessment & Plan    CAD s/p CABG -  08/26/20 normal LV continue ASA/Beta blocker and statin  Finished with cardiac rehab trying to do [facility] S[facility] at [facility]  HTN - Hydralazine d/c started on ARB 05/11/21    GERD - Continue Protonix.   HLD, LDL goal <70 - Intolerance to Atorvastatin with dizziness. On crestor 20 mg dailyLDL 72 at goal 12/30/20  Carotid artery disease - 08/2020 bilateral 1-39% ICA stenosis. Continue aspirin, statin.  Prostate:  f/u [place] post prostatectomy / TURP on Proscar  SL nitro called in    Disposition: Follow up  a year   Signed, Jenkins Rouge, MD 08/17/2021, 10:03 AM [place]

## 2021-08-17 ENCOUNTER — Encounter: Payer: Self-pay | Admitting: Cardiovascular Disease

## 2021-08-17 ENCOUNTER — Ambulatory Visit: Payer: Medicare HMO | Admitting: Gastroenterology

## 2021-08-17 ENCOUNTER — Ambulatory Visit: Payer: Medicare HMO | Admitting: Cardiovascular Disease

## 2021-08-17 VITALS — BP 134/72 | HR 62 | Ht 69.0 in | Wt 171.0 lb

## 2021-08-17 DIAGNOSIS — Z951 Presence of aortocoronary bypass graft: Secondary | ICD-10-CM | POA: Diagnosis not present

## 2021-08-17 MED ORDER — NITROGLYCERIN 0.4 MG SL SUBL: SUBLINGUAL_TABLET | SUBLINGUAL | 3 refills | Status: AC

## 2021-08-17 NOTE — Patient Instructions (Signed)

## 2021-10-05 ENCOUNTER — Telehealth: Payer: Self-pay | Admitting: *Deleted

## 2021-10-05 NOTE — Telephone Encounter (Signed)
   Pre-operative Risk Assessment    Patient Name: Tony Shaffer  DOB: [birth year] MRN: [id number]      Request for Surgical Clearance    Procedure:   Colonoscopy  Date of Surgery:  Clearance 11/15/21                                 Surgeon:  Dr. Alessandra Bevels Surgeon's Group or Practice Name:  [facility] Phone number:  [phone] Fax number:  [phone]   Type of Clearance Requested:   - Pharmacy:  Hold Clopidogrel (Plavix) X 5 days before.   Type of Anesthesia:  Not Indicated   Additional requests/questions:    Signed, Greer Ee   10/05/2021, 3:19 PM

## 2021-10-06 NOTE — Telephone Encounter (Signed)
Primary Cardiologist:Peter Johnsie Cancel, MD   Preoperative team, please contact this patient and set up a phone call appointment for further preoperative risk assessment. Please obtain consent and complete medication review. Thank you for your help.   Patient may hold Plavix for 5 days prior to colonoscopy and should resume as soon as possible post-procedure.    Emmaline Life, NP-C    10/06/2021, 12:56 PM [place] [address] Office [phone] Fax [phone]

## 2021-10-09 NOTE — Telephone Encounter (Signed)
Left message for the pt to call back to schedule a tele pre op appt 

## 2021-10-10 NOTE — Telephone Encounter (Signed)
Pt is returning call.  

## 2021-10-10 NOTE — Telephone Encounter (Signed)
Left message x 2 for the pt to call back for tele pre op appt

## 2021-10-11 ENCOUNTER — Telehealth: Payer: Self-pay | Admitting: *Deleted

## 2021-10-11 NOTE — Telephone Encounter (Signed)
Tele appt 10/30/21 at 9:40 . Med rec and consent are done.

## 2021-10-11 NOTE — Telephone Encounter (Signed)
Tele appt 10/30/21 at 9:40 . Med rec and consent are done.     Patient Consent for Virtual Visit        [name] has provided verbal consent on 10/11/2021 for a virtual visit (video or telephone).   CONSENT FOR VIRTUAL VISIT FOR:  Tony Shaffer  By participating in this virtual visit I agree to the following:  I hereby voluntarily request, consent and authorize [facility] and its employed or contracted physicians, physician assistants, nurse practitioners or other licensed health care professionals (the Practitioner), to provide me with telemedicine health care services (the "Services") as deemed necessary by the treating Practitioner. I acknowledge and consent to receive the Services by the Practitioner via telemedicine. I understand that the telemedicine visit will involve communicating with the Practitioner through live audiovisual communication technology and the disclosure of certain medical information by electronic transmission. I acknowledge that I have been given the opportunity to request an in-person assessment or other available alternative prior to the telemedicine visit and am voluntarily participating in the telemedicine visit.  I understand that I have the right to withhold or withdraw my consent to the use of telemedicine in the course of my care at any time, without affecting my right to future care or treatment, and that the Practitioner or I may terminate the telemedicine visit at any time. I understand that I have the right to inspect all information obtained and/or recorded in the course of the telemedicine visit and may receive copies of available information for a reasonable fee.  I understand that some of the potential risks of receiving the Services via telemedicine include:  Delay or interruption in medical evaluation due to technological equipment failure or disruption; Information transmitted may not be sufficient (e.g. poor resolution of images) to allow for  appropriate medical decision making by the Practitioner; and/or  In rare instances, security protocols could fail, causing a breach of personal health information.  Furthermore, I acknowledge that it is my responsibility to provide information about my medical history, conditions and care that is complete and accurate to the best of my ability. I acknowledge that Practitioner's advice, recommendations, and/or decision may be based on factors not within their control, such as incomplete or inaccurate data provided by me or distortions of diagnostic images or specimens that may result from electronic transmissions. I understand that the practice of medicine is not an exact science and that Practitioner makes no warranties or guarantees regarding treatment outcomes. I acknowledge that a copy of this consent can be made available to me via my patient portal ([place]), or I can request a printed copy by calling the office of [facility].    I understand that my insurance will be billed for this visit.   I have read or had this consent read to me. I understand the contents of this consent, which adequately explains the benefits and risks of the Services being provided via telemedicine.  I have been provided ample opportunity to ask questions regarding this consent and the Services and have had my questions answered to my satisfaction. I give my informed consent for the services to be provided through the use of telemedicine in my medical care

## 2021-10-29 NOTE — Progress Notes (Signed)
Virtual Visit via Telephone Note   Because of Tony Shaffer's co-morbid illnesses, he is at least at moderate risk for complications without adequate follow up.  This format is felt to be most appropriate for this patient at this time.  The patient did not have access to video technology/had technical difficulties with video requiring transitioning to audio format only (telephone).  All issues noted in this document were discussed and addressed.  No physical exam could be performed with this format.  Please refer to the patient's chart for his consent to telehealth for [facility].  Evaluation Performed:  Preoperative Cardiovascular Risk Assessment _____________   Date:  10/30/2021   Patient ID:  Tony Shaffer, DOB 1950-05-14, MRN [id number] Patient Location:  Home Provider location:   Office  Primary Care Provider:  Sonia Side., FNP Primary Cardiologist:  Jenkins Rouge, MD  Chief Complaint / Patient Profile   Tony Shaffer is a 71 y.o. year old male with a history of CAD with NSTEMI in 08/2020 s/p CABG x4 (LIMA-LAD,SVG-PDA, SVG-OM,SVG Diag), RBBB, bilateral carotid artery stenosis, hypertension, hyperlipidemia,GERD, and BPH  who is pending a colonoscopy and presents today for telephonic preoperative cardiovascular risk assessment.  Past Medical History    Past Medical History:  Diagnosis Date   Anticoagulated by anticoagulation treatment    plavix--- managed by cardiology   Benign prostatic hyperplasia with urinary retention    urologist-- dr Junious Silk;   s/p prostatectomy in  08/ 2014 for very enlarged prostate   Coronary artery disease    cardiologist--- dr Johnsie Cancel---  hx nstemi 08-24-2020, s/p cabg x4 08-26-2020   Foley catheter in place    GERD (gastroesophageal reflux disease)    watches diet   History of non-ST elevation myocardial infarction (NSTEMI) 08/24/2020   Hyperlipidemia    Hypertension    RBBB (right bundle branch block with left anterior fascicular  block)    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   LIMA to LAD, SVG to PDA, SVG to OM and Diagonal   Seasonal allergies    Past Surgical History:  Procedure Laterality Date   CARDIAC CATHETERIZATION     CORONARY ARTERY BYPASS GRAFT N/A 08/26/2020   Procedure: CORONARY ARTERY BYPASS GRAFTING (CABG), ON PUMP, TIMES FOUR, USING LEFT INTERNAL MAMMARY ARTERY AND RIGHT ENDOSCOPICALLY HARVESTED GREATER SAPHENOUS VEIN;  Surgeon: Wonda Olds, MD;  Location: [place];  Service: Open Heart Surgery;  Laterality: N/A;   ENDOVEIN HARVEST OF GREATER SAPHENOUS VEIN Right 08/26/2020   Procedure: ENDOVEIN HARVEST OF GREATER SAPHENOUS VEIN;  Surgeon: Wonda Olds, MD;  Location: [place];  Service: Open Heart Surgery;  Laterality: Right;   LEFT HEART CATH AND CORONARY ANGIOGRAPHY N/A 08/24/2020   Procedure: LEFT HEART CATH AND CORONARY ANGIOGRAPHY;  Surgeon: Burnell Blanks, MD;  Location: [place] [facility];  Service: Cardiovascular;  Laterality: N/A;   PROSTATECTOMY N/A 10/17/2012   Procedure: TRANSVESICLE PROSTATECTOMY SUPRAPUBIC;  Surgeon: Ailene Rud, MD;  [facility];  Service: Urology;  Laterality: N/A;   TEE WITHOUT CARDIOVERSION N/A 08/26/2020   Procedure: TRANSESOPHAGEAL ECHOCARDIOGRAM (TEE);  Surgeon: Wonda Olds, MD;  Location: [place];  Service: Open Heart Surgery;  Laterality: N/A;   TRANSURETHRAL RESECTION OF PROSTATE N/A 11/25/2020   Procedure: TRANSURETHRAL RESECTION OF THE PROSTATE (TURP);  Surgeon: Festus Aloe, MD;  Location: [facility];  Service: Urology;  Laterality: N/A;  [place]  No Known Allergies  History of Present Illness    Tony Shaffer is a 71 y.o. male who presents via audio/video conferencing for a telehealth visit today.  Patient was last seen in our office on 08/17/2021 by Dr. Johnsie Cancel.  At that time, he was doing well from a cardiac standpoint. He is now pending procedure as outlined above. Since  his last visit, he has been doing well. He currently has an upper respiratory infection with nasal congestion and cough but is doing well from a cardiac standpoint. He has some chest pain when he coughs with his current URI but no other chest pain. Does not sound like angina. He has chronic dyspnea on exertion but this is stable. No orthopnea, PND, edema. No significant palpitations. He notes some brief lightheadedness/dizziness if he stands too quickly but this does not last long. No recent syncope. He is able to complete >4.0 METS.  Home Medications    Prior to Admission medications   Medication Sig Start Date End Date Taking? Authorizing Provider  aspirin EC 81 MG tablet Take 81 mg by mouth daily. Swallow whole.    [provider]  cetirizine (ZYRTEC) 10 MG tablet Take 10 mg by mouth daily.    [provider]  Cholecalciferol (VITAMIN D3) 50 MCG (2000 UT) TABS Take 2,000 Units by mouth daily.    [provider]  clopidogrel (PLAVIX) 75 MG tablet Take 1 tablet (75 mg total) by mouth daily. 04/18/21   Josue Hector, MD  finasteride (PROSCAR) 5 MG tablet Take 5 mg by mouth daily.    [provider]  hydrOXYzine (ATARAX) 25 MG tablet Take 25 mg by mouth at bedtime. 10/02/21   [provider]  losartan (COZAAR) 100 MG tablet Take 1 tablet (100 mg total) by mouth daily. 05/11/21   Josue Hector, MD  metoprolol tartrate (LOPRESSOR) 100 MG tablet Take 75 mg by mouth 2 (two) times daily. Patient not taking: Reported on 08/17/2021    [provider]  metoprolol tartrate (LOPRESSOR) 50 MG tablet Take 1.5 tablets (75 mg total) by mouth 2 (two) times daily. 02/15/21 10/11/21  Josue Hector, MD  neomycin-bacitracin-polymyxin (NEOSPORIN) ointment Apply 1 application. topically daily as needed (rash).    [provider]  nitroGLYCERIN (NITROSTAT) 0.4 MG SL tablet Take 1 tablet, under your tongue, while sitting.  If no relief of pain may repeat NTG,  one tab every 5 minutes up to 3 tablets total over 15 minutes.  If no relief CALL 911. 08/17/21   Josue Hector, MD  rosuvastatin (CRESTOR) 20 MG tablet Take 1 tablet (20 mg total) by mouth daily. 04/18/21   Josue Hector, MD  triamcinolone ointment (KENALOG) 0.1 % Apply 1 application topically 2 (two) times daily as needed. 05/04/21   [provider]    Physical Exam    Vital Signs:  Tony Shaffer does not have vital signs available for review today.  Given telephonic nature of communication, physical exam is limited. Alert and oriented x3. No acute distress. Normal affect. Speech and respirations are unlabored.  Accessory Clinical Findings    None  Assessment & Plan    Preoperative Cardiovascular Risk Assessment: Patient has an upcoming colonoscopy planned. He is stable from a cardiac standpoint with no angina, new/worsening shortness of breath, acute CHF symptoms, significant palpitations, or syncope. He is able to complete >4.0 METS. Therefore, based on ACC/AHA guidelines, patient would be at acceptable risk for the planned procedure  without further cardiovascular testing. Per our pre-op algorithm, Plavix can be held for 5 days prior to procedure as request. Please restart this as soon as safely possible afterwards. I will route this recommendation to the requesting party via Epic fax function.    Time:   Today, I have spent 6 minutes and 42 seconds with the patient with telehealth technology discussing medical history, symptoms, and management plan.     Darreld Mclean, PA-C  10/30/2021, 9:48 AM

## 2021-10-30 ENCOUNTER — Ambulatory Visit (INDEPENDENT_AMBULATORY_CARE_PROVIDER_SITE_OTHER): Payer: Medicare HMO | Admitting: Student

## 2021-10-30 DIAGNOSIS — Z01810 Encounter for preprocedural cardiovascular examination: Secondary | ICD-10-CM

## 2022-01-31 ENCOUNTER — Telehealth: Payer: Self-pay | Admitting: Cardiovascular Disease

## 2022-01-31 MED ORDER — CLOPIDOGREL BISULFATE 75 MG PO TABS: 75.0000 mg | ORAL_TABLET | Freq: Every day | ORAL | 2 refills | Status: DC

## 2022-01-31 NOTE — Telephone Encounter (Signed)
Pt's medication was sent to pt's pharmacy as requested. Confirmation received.  °

## 2022-01-31 NOTE — Telephone Encounter (Signed)
?*  STAT* If patient is at the pharmacy, call can be transferred to refill team. ? ? ?1. Which medications need to be refilled? (please list name of each medication and dose if known) clopidogrel (PLAVIX) 75 MG tablet ? ?2. Which pharmacy/location (including street and city if local pharmacy) is medication to be sent to? [facility] - [place], [place] - [address] ? ?3. Do they need a 30 day or 90 day supply? 90  ?

## 2022-04-02 ENCOUNTER — Other Ambulatory Visit: Payer: Self-pay | Admitting: Cardiovascular Disease

## 2022-06-18 ENCOUNTER — Other Ambulatory Visit: Payer: Self-pay | Admitting: Cardiovascular Disease

## 2022-09-03 ENCOUNTER — Other Ambulatory Visit: Payer: Self-pay | Admitting: Cardiovascular Disease

## 2022-10-31 ENCOUNTER — Other Ambulatory Visit: Payer: Self-pay | Admitting: Cardiovascular Disease

## 2022-11-23 ENCOUNTER — Other Ambulatory Visit: Payer: Self-pay | Admitting: Cardiovascular Disease

## 2022-12-05 ENCOUNTER — Other Ambulatory Visit: Payer: Self-pay | Admitting: Cardiovascular Disease

## 2022-12-13 ENCOUNTER — Other Ambulatory Visit: Payer: Self-pay | Admitting: Cardiovascular Disease

## 2023-02-07 ENCOUNTER — Other Ambulatory Visit: Payer: Self-pay | Admitting: Cardiovascular Disease

## 2023-10-22 ENCOUNTER — Telehealth: Payer: Self-pay

## 2023-10-22 NOTE — Telephone Encounter (Signed)
   Pre-operative Risk Assessment    Patient Name: Tony Shaffer  DOB: 1950-10-17 MRN: [id number]   Date of last office visit: 10/30/21 ALINE DOOR, PA-C Date of next office visit: NONE   Request for Surgical Clearance    Procedure:  COLONOSCOPY  Date of Surgery:  Clearance 11/14/23                                Surgeon:  DR LAYLA LAH Surgeon's Group or Practice Name:  [facility] Phone number:  [phone] Fax number:  [phone]   Type of Clearance Requested:   - Medical  - Pharmacy:  Hold Aspirin  (NOT INDICATED ON REQUEST) PLAVIX  HOLD FROM 11/09/23 - 11/14/23   Type of Anesthesia:  PROPOFOL    Additional requests/questions:    Signed, Lucie DELENA Ku   10/22/2023, 3:50 PM

## 2023-10-22 NOTE — Telephone Encounter (Signed)
    Primary Cardiologist:Peter Delford, MD  Chart reviewed as part of pre-operative protocol coverage. Because of Walden Statz Gintz's past medical history and time since last visit, he/she will require a follow-up visit in order to better assess preoperative cardiovascular risk.  Pre-op covering staff: - Please schedule office appointment and call patient to inform them. - Please contact requesting surgeon's office via preferred method (i.e, phone, fax) to inform them of need for appointment prior to surgery.  If applicable, this message will also be routed to pharmacy pool and/or primary cardiologist for input on holding anticoagulant/antiplatelet agent as requested below so that this information is available at time of patient's appointment.   Josefa CHRISTELLA Beauvais, NP  10/22/2023, 4:03 PM

## 2023-10-22 NOTE — Telephone Encounter (Signed)
 Patient has been scheduled with Reche Finder, NP on 11/04/23 for an in office appointment for cardiac clearance. I instructed him on how to get to the [facility] office and he asked which suite they were located in and I told him there wasn't one. He asked if we filed with his insurance and I informed him they can answer all of his questions in regards to that upon his arrival.

## 2023-11-04 ENCOUNTER — Encounter (HOSPITAL_BASED_OUTPATIENT_CLINIC_OR_DEPARTMENT_OTHER): Payer: Self-pay | Admitting: Family

## 2023-11-04 ENCOUNTER — Ambulatory Visit (HOSPITAL_BASED_OUTPATIENT_CLINIC_OR_DEPARTMENT_OTHER): Admitting: Family

## 2023-11-04 VITALS — BP 146/80 | HR 61 | Ht 69.0 in | Wt 171.0 lb

## 2023-11-04 DIAGNOSIS — Z951 Presence of aortocoronary bypass graft: Secondary | ICD-10-CM | POA: Diagnosis not present

## 2023-11-04 DIAGNOSIS — I2510 Atherosclerotic heart disease of native coronary artery without angina pectoris: Secondary | ICD-10-CM | POA: Diagnosis not present

## 2023-11-04 DIAGNOSIS — I10 Essential (primary) hypertension: Secondary | ICD-10-CM | POA: Diagnosis not present

## 2023-11-04 DIAGNOSIS — E785 Hyperlipidemia, unspecified: Secondary | ICD-10-CM

## 2023-11-04 MED ORDER — METOPROLOL TARTRATE 50 MG PO TABS: 50.0000 mg | ORAL_TABLET | Freq: Two times a day (BID) | ORAL | Status: AC

## 2023-11-04 NOTE — Patient Instructions (Signed)
 Medication Instructions:   Your physician recommends that you continue on your current medications as directed. Please refer to the Current Medication list given to you today.  You can hold your Asprin 7 days prior to your colonoscopy.   *If you need a refill on your cardiac medications before your next appointment, please call your pharmacy*  Lab Work:  TODAY!!!!! Direct LDL,CMET  If you have labs (blood work) drawn today and your tests are completely normal, you will receive your results only by: MyChart Message (if you have MyChart) OR A paper copy in the mail If you have any lab test that is abnormal or we need to change your treatment, we will call you to review the results.  Testing/Procedures:  None ordered.   Follow-Up: At [facility], you and your health needs are our priority.  As part of our continuing mission to provide you with exceptional heart care, our providers are all part of one team.  This team includes your primary Cardiologist (physician) and Advanced Practice Providers or APPs (Physician Assistants and Nurse Practitioners) who all work together to provide you with the care you need, when you need it.  Your next appointment:   6 month(s)  Provider:   Maude Emmer, MD    We recommend signing up for the patient portal called MyChart.  Sign up information is provided on this After Visit Summary.  MyChart is used to connect with patients for Virtual Visits (Telemedicine).  Patients are able to view lab/test results, encounter notes, upcoming appointments, etc.  Non-urgent messages can be sent to your provider as well.   To learn more about what you can do with MyChart, go to [email or web address].   Other Instructions  Your physician wants you to follow-up in: 6 months.  You will receive a reminder letter in the mail two months in advance. If you don't receive a letter, please call our office to schedule the follow-up appointment.      Heart  Healthy Diet Recommendations: A low-salt diet is recommended. Meats should be grilled, baked, or boiled. Avoid fried foods. Focus on lean protein sources like fish or chicken with vegetables and fruits. The American Heart Association is a Chief Technology Officer!  American Heart Association Diet and Lifeystyle Recommendations   Exercise recommendations: The American Heart Association recommends 150 minutes of moderate intensity exercise weekly. Try 30 minutes of moderate intensity exercise 4-5 times per week. This could include walking, jogging, or swimming.

## 2023-11-04 NOTE — Progress Notes (Signed)
 Cardiology Office Note   Date:  11/04/2023  ID:  [name], DOB [birth year], MRN [id number] PCP: Claudene Prentice DELENA Mickey., FNP   HeartCare Providers Cardiologist:  Maude Emmer, MD     History of Present Illness Tony Shaffer is a 73 y.o. male with hx of CAD with NSTEMI in 08/2020 s/p CABG x4 (LIMA-LAD,SVG-PDA, SVG-OM,SVG Diag), RBBB, bilateral carotid artery stenosis, hypertension, hyperlipidemia,GERD, and BPH.  Discussed the use of AI scribe software for clinical note transcription with the patient, who gave verbal consent to proceed.  History of Present Illness Tony Shaffer is a 73 year old male with hypertension who presents with concerns about blood pressure management.  He experiences occasional chest discomfort described as a 'stingy' sensation on his sides, occurring sporadically and resolving with rest. This is similar to prior paresthesias. There is no significant chest pain during normal activities, but he feels tired, attributing it to aging. No exertional dyspnea.   He has hypertension with a previous blood pressure reading of 170 mmHg two weeks ago. He was on losartan  previously and he perceived as being discontinued, suspect he ran out of refills. Currently takes metoprolol  twice daily. He experiences headaches and a sensation of pressure, which he associates with elevated blood pressure. Does not check BP at home.   He performs self-care, walks indoors, and manages light to moderate housework. He stopped walking in the park due to safety concerns and wants to exercise more but lacks energy.  He has a family history of hypertension, with both his brother and sister affected.   ROS: Please see the history of present illness.    All other systems reviewed and are negative.   Studies Reviewed EKG Interpretation Date/Time:  Monday November 04 2023 09:04:23 EDT Ventricular Rate:  61 PR Interval:  162 QRS Duration:  138 QT Interval:  428 QTC Calculation: 430 R  Axis:   -27  Text Interpretation: Normal sinus rhythm Right bundle branch block Confirmed by Vannie Mora ([id number]) on 11/04/2023 9:23:06 AM    Cardiac Studies & Procedures   ______________________________________________________________________________________________ CARDIAC CATHETERIZATION  CARDIAC CATHETERIZATION 08/24/2020  Conclusion  Ost Cx to Prox Cx lesion is 40% stenosed.  Dist Cx lesion is 40% stenosed.  Ost LM to Mid LM lesion is 50% stenosed.  Ost LAD to Prox LAD lesion is 99% stenosed.  The left ventricular systolic function is normal.  LV end diastolic pressure is normal.  The left ventricular ejection fraction is greater than 65% by visual estimate.  There is no mitral valve regurgitation.  1. Moderate distal left main stenosis 2. Severe, heavily calcified ostial/proximal LAD stenosis. This is a functional chronic occlusion. The LAD also fills from right to left collaterals through a small, early branch. 3. Mild disease in the dominant Circumflex 4. Small non-dominant RCA 5. Normal LV systolic function  Recommendations: We will ask  CT surgery to see him tomorrow to review candidacy for bypass surgery. I think CABG is the best option for revascularization. I have reviewed the films with Dr. Darron as well. Continue ASA and statin. Will restart IV heparin  6 hours post sheath pull.  Findings Coronary Findings Diagnostic  Dominance: Left  Left Main Ost LM to Mid LM lesion is 50% stenosed.  Left Anterior Descending Vessel is large. Ost LAD to Prox LAD lesion is 99% stenosed. The lesion is calcified.  Left Circumflex Ost Cx to Prox Cx lesion is 40% stenosed. Dist Cx lesion is 40% stenosed.  Right Coronary Artery  Vessel is small.  Intervention  No interventions have been documented.     ECHOCARDIOGRAM  ECHOCARDIOGRAM COMPLETE 08/25/2020  Narrative ECHOCARDIOGRAM REPORT    Patient Name:   Tony Shaffer Date of Exam: 08/25/2020 Medical Rec #:   [id number]       Height:       69.0 in Accession #:    [id number]      Weight:       166.8 lb Date of Birth:  [birth year]        BSA:          1.913 m Patient Age:    70 years        BP:           155/102 mmHg Patient Gender: M               HR:           76 bpm. Exam Location:  Inpatient  Procedure: Color Doppler, Cardiac Doppler, 2D Echo and Intracardiac Opacification Agent  Indications:    CAD  History:        Patient has prior history of Echocardiogram examinations, most recent 12/11/2019. CAD, Arrythmias:RBBB, Signs/Symptoms:Chest Pain; Risk Factors:Hypertension.  Sonographer:    Vernell Hammersmith RDCS Referring Phys: [id number] BROADUS Z ATKINS  IMPRESSIONS   1. Left ventricular ejection fraction, by estimation, is 55 to 60%. The left ventricle has normal function. The left ventricle demonstrates regional wall motion abnormalities with apical hypokinesis. Left ventricular diastolic parameters are consistent with Grade I diastolic dysfunction (impaired relaxation). 2. Right ventricular systolic function is normal. The right ventricular size is normal. Tricuspid regurgitation signal is inadequate for assessing PA pressure. 3. The mitral valve is normal in structure. No evidence of mitral valve regurgitation. No evidence of mitral stenosis. 4. The aortic valve is tricuspid. Aortic valve regurgitation is not visualized. Mild aortic valve sclerosis is present, with no evidence of aortic valve stenosis. 5. The IVC was not visualized.  FINDINGS Left Ventricle: Left ventricular ejection fraction, by estimation, is 55 to 60%. The left ventricle has normal function. The left ventricle demonstrates regional wall motion abnormalities. The left ventricular internal cavity size was normal in size. There is no left ventricular hypertrophy. Left ventricular diastolic parameters are consistent with Grade I diastolic dysfunction (impaired relaxation).  Right Ventricle: The right ventricular size is normal.  No increase in right ventricular wall thickness. Right ventricular systolic function is normal. Tricuspid regurgitation signal is inadequate for assessing PA pressure.  Left Atrium: Left atrial size was normal in size.  Right Atrium: Right atrial size was normal in size.  Pericardium: There is no evidence of pericardial effusion.  Mitral Valve: The mitral valve is normal in structure. No evidence of mitral valve regurgitation. No evidence of mitral valve stenosis.  Tricuspid Valve: The tricuspid valve is normal in structure. Tricuspid valve regurgitation is not demonstrated.  Aortic Valve: The aortic valve is tricuspid. Aortic valve regurgitation is not visualized. Mild aortic valve sclerosis is present, with no evidence of aortic valve stenosis. Aortic valve mean gradient measures 2.0 mmHg. Aortic valve peak gradient measures 4.4 mmHg. Aortic valve area, by VTI measures 2.78 cm.  Pulmonic Valve: The pulmonic valve was normal in structure. Pulmonic valve regurgitation is not visualized.  Aorta: The aortic root is normal in size and structure.  IAS/Shunts: No atrial level shunt detected by color flow Doppler.   LEFT VENTRICLE PLAX 2D LVIDd:         5.20 cm  Diastology  LVIDs:         2.90 cm  LV e' medial:    6.64 cm/s LV PW:         0.70 cm  LV E/e' medial:  6.2 LV IVS:        0.70 cm  LV e' lateral:   7.94 cm/s LVOT diam:     2.00 cm  LV E/e' lateral: 5.2 LV SV:         54 LV SV Index:   28 LVOT Area:     3.14 cm   RIGHT VENTRICLE RV Basal diam:  2.50 cm RV Mid diam:    1.90 cm RV S prime:     11.70 cm/s TAPSE (M-mode): 2.2 cm  LEFT ATRIUM           Index       RIGHT ATRIUM           Index LA diam:      3.00 cm 1.57 cm/m  RA Area:     10.60 cm LA Vol (A2C): 39.7 ml 20.76 ml/m RA Volume:   19.00 ml  9.93 ml/m LA Vol (A4C): 11.3 ml 5.91 ml/m AORTIC VALVE AV Area (Vmax):    2.61 cm AV Area (Vmean):   2.55 cm AV Area (VTI):     2.78 cm AV Vmax:           105.00  cm/s AV Vmean:          67.200 cm/s AV VTI:            0.193 m AV Peak Grad:      4.4 mmHg AV Mean Grad:      2.0 mmHg LVOT Vmax:         87.20 cm/s LVOT Vmean:        54.500 cm/s LVOT VTI:          0.171 m LVOT/AV VTI ratio: 0.89  AORTA Ao Root diam: 3.10 cm Ao Asc diam:  3.20 cm  MITRAL VALVE MV Area (PHT): 2.36 cm    SHUNTS MV Decel Time: 321 msec    Systemic VTI:  0.17 m MV E velocity: 41.10 cm/s  Systemic Diam: 2.00 cm MV A velocity: 78.00 cm/s MV E/A ratio:  0.53  Ezra Shuck MD Electronically signed by Ezra Shuck MD Signature Date/Time: 08/25/2020/4:51:58 PM    Final   TEE  ECHO INTRAOPERATIVE TEE 08/26/2020  Narrative *INTRAOPERATIVE TRANSESOPHAGEAL REPORT *    Patient Name:   ARISH REDNER Date of Exam: 08/26/2020 Medical Rec #:  [id number]       Height:       69.0 in Accession #:    [id number]      Weight:       166.2 lb Date of Birth:  1950-11-03        BSA:          1.91 m Patient Age:    70 years        BP:           159/93 mmHg Patient Gender: M               HR:           62 bpm. Exam Location:  Inpatient  Transesophogeal exam was perform intraoperatively during surgical procedure. Patient was closely monitored under general anesthesia during the entirety of examination.  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 POST-OP IMPRESSIONS - Left Ventricle: Post CABG: The patient came off bypass on the initial attempt. There did not appear to be any new findings from preop exam. Left ventricular contraction did improve with volume replacement. The TEE that had been placed uneventfully after induction was removed at the end of case without difficulty. The patient was later taken to the SICU in stable  conditon.  PRE-OP FINDINGS Left Ventricle: The left ventricle has low normal systolic function, with an ejection fraction of 50-55%.   Right Ventricle: The right ventricle has normal systolic function.  Left Atrium: The left atrial appendage is well visualized and there is no evidence of thrombus present.  Mitral Valve: The mitral valve is normal in structure.  Tricuspid Valve: The tricuspid valve was normal in structure.  Aortic Valve: The aortic valve is tricuspid.    Allena Seip MD Electronically signed by Allena Seip MD Signature Date/Time: 08/27/2020/8:35:59 AM   Final        ______________________________________________________________________________________________      Risk Assessment/Calculations          Physical Exam VS:  BP (!) 146/80   Pulse 61   Ht 5' 9 (1.753 m)   Wt 171 lb (77.6 kg)   SpO2 99%   BMI 25.25 kg/m        Wt Readings from Last 3 Encounters:  11/04/23 171 lb (77.6 kg)  08/17/21 171 lb (77.6 kg)  05/11/21 164 lb (74.4 kg)    GEN: Well nourished, well developed in no acute distress NECK: No JVD; No carotid bruits CARDIAC: RRR, no murmurs, rubs, gallops RESPIRATORY:  Clear to auscultation without rales, wheezing or rhonchi  ABDOMEN: Soft, non-tender, non-distended EXTREMITIES:  No edema; No deformity   ASSESSMENT AND PLAN Assessment and Plan Assessment & Plan Essential hypertension Blood pressure not at goal <130/80. No longer taking Losartan , ran out of refills. - Plan to resume losartan  however update CMP prior to send Rx to pharmacy -Discussed to monitor BP at home at least 2 hours after medications and sitting for 5-10 minutes. - Continue metoprolol  tartrate 25 mg twice daily  CAD s/p CABG / HLD, LDL goal <70 Stable with no anginal symptoms. No indication for ischemic evaluation.  GDMT aspirin  81 mg daily, metoprolol  succinate 50 mg twice daily, rosuvastatin  20 mg daily. Heart healthy diet and regular  cardiovascular exercise encouraged.  Direct LDL today.   Preop Clearance Colonoscopy on November 14, 2023. According to the Revised Cardiac Risk Index (RCRI), his Perioperative Risk of Major Cardiac Event is (%): 0.9. His Functional Capacity in METs is: 5.07 according to the Duke Activity Status Index (DASI). Per AHA/ACC guidelines, he is deemed acceptable risk for the planned procedure without additional cardiovascular testing. Will route to surgical team so they are aware.  Hold aspirin  7 days prior to planned procedure.         Dispo: follow up in 6 mos  Signed, Reche GORMAN Finder, NP

## 2023-11-05 ENCOUNTER — Ambulatory Visit (HOSPITAL_BASED_OUTPATIENT_CLINIC_OR_DEPARTMENT_OTHER): Payer: Self-pay | Admitting: Family

## 2023-11-05 DIAGNOSIS — I10 Essential (primary) hypertension: Secondary | ICD-10-CM

## 2023-11-05 DIAGNOSIS — Z5181 Encounter for therapeutic drug level monitoring: Secondary | ICD-10-CM

## 2023-11-05 LAB — COMPREHENSIVE METABOLIC PANEL WITH GFR
ALT: 31 IU/L (ref 0–44)
AST: 23 IU/L (ref 0–40)
Albumin: 4.6 g/dL (ref 3.8–4.8)
Alkaline Phosphatase: 68 IU/L (ref 44–121)
BUN/Creatinine Ratio: 6 — ABNORMAL LOW (ref 10–24)
BUN: 6 mg/dL — ABNORMAL LOW (ref 8–27)
Bilirubin Total: 0.5 mg/dL (ref 0.0–1.2)
CO2: 20 mmol/L (ref 20–29)
Calcium: 9.6 mg/dL (ref 8.6–10.2)
Chloride: 101 mmol/L (ref 96–106)
Creatinine, Ser: 0.98 mg/dL (ref 0.76–1.27)
Globulin, Total: 2.8 g/dL (ref 1.5–4.5)
Glucose: 53 mg/dL — ABNORMAL LOW (ref 70–99)
Potassium: 4.9 mmol/L (ref 3.5–5.2)
Sodium: 141 mmol/L (ref 134–144)
Total Protein: 7.4 g/dL (ref 6.0–8.5)
eGFR: 81 mL/min/1.73 (ref >59)

## 2023-11-05 LAB — LDL CHOLESTEROL, DIRECT: LDL Direct: 69 mg/dL (ref 0–99)

## 2023-11-06 MED ORDER — LOSARTAN POTASSIUM 50 MG PO TABS: 50.0000 mg | ORAL_TABLET | Freq: Every day | ORAL | 3 refills | Status: AC

## 2023-11-06 NOTE — Telephone Encounter (Addendum)
   Normal kidneys and liver.  Blood sugar was a bit low, ensure eating regular meals. LDL (bad cholesterol) at goal less than 70. Good result!    Recommend start Losartan  50mg  daily for BP control. Repeat labs in 1-2 weeks for monitoring.   Written by Reche GORMAN Finder, NP on 11/05/2023  1:44 PM EDT  Reviewed above with patient  Medication ordered and mailed lab orders

## 2023-11-06 NOTE — Telephone Encounter (Deleted)
-----   Message from Burnard JULIANNA Cha, RN sent at 11/05/2023  2:23 PM EDT -----
